# Patient Record
Sex: Female | Born: 1960 | Hispanic: No | Smoking: Never smoker
Health system: Southern US, Community
[De-identification: ages and names within clinical notes are randomized; demographics above are authoritative.]

## PROBLEM LIST (undated history)

## (undated) ENCOUNTER — Emergency Department (HOSPITAL_BASED_OUTPATIENT_CLINIC_OR_DEPARTMENT_OTHER): Payer: Medicaid Other

## (undated) DIAGNOSIS — Z9981 Dependence on supplemental oxygen: Secondary | ICD-10-CM

## (undated) DIAGNOSIS — I10 Essential (primary) hypertension: Secondary | ICD-10-CM

## (undated) DIAGNOSIS — D649 Anemia, unspecified: Secondary | ICD-10-CM

## (undated) DIAGNOSIS — R011 Cardiac murmur, unspecified: Secondary | ICD-10-CM

## (undated) DIAGNOSIS — E119 Type 2 diabetes mellitus without complications: Secondary | ICD-10-CM

## (undated) DIAGNOSIS — I350 Nonrheumatic aortic (valve) stenosis: Secondary | ICD-10-CM

## (undated) DIAGNOSIS — I2510 Atherosclerotic heart disease of native coronary artery without angina pectoris: Secondary | ICD-10-CM

## (undated) DIAGNOSIS — E785 Hyperlipidemia, unspecified: Secondary | ICD-10-CM

## (undated) HISTORY — PX: OTHER SURGICAL HISTORY: SHX169

## (undated) HISTORY — PX: MULTIPLE TOOTH EXTRACTIONS: SHX2053

## (undated) HISTORY — PX: HYSTEROSCOPY WITH D & C: SHX1775

---

## 2017-11-20 DIAGNOSIS — E119 Type 2 diabetes mellitus without complications: Secondary | ICD-10-CM

## 2017-11-20 DIAGNOSIS — I10 Essential (primary) hypertension: Secondary | ICD-10-CM | POA: Diagnosis present

## 2017-11-20 DIAGNOSIS — Z794 Long term (current) use of insulin: Secondary | ICD-10-CM

## 2019-08-09 DIAGNOSIS — K219 Gastro-esophageal reflux disease without esophagitis: Secondary | ICD-10-CM | POA: Diagnosis present

## 2019-08-09 DIAGNOSIS — N182 Chronic kidney disease, stage 2 (mild): Secondary | ICD-10-CM | POA: Diagnosis present

## 2021-07-17 ENCOUNTER — Inpatient Hospital Stay (HOSPITAL_BASED_OUTPATIENT_CLINIC_OR_DEPARTMENT_OTHER)
Admission: EM | Admit: 2021-07-17 | Discharge: 2021-07-22 | DRG: 516 | Disposition: A | Discharge status: Home / Self Care | Payer: Medicaid Other | Attending: Family Medicine | Admitting: Family Medicine

## 2021-07-17 ENCOUNTER — Encounter (HOSPITAL_BASED_OUTPATIENT_CLINIC_OR_DEPARTMENT_OTHER): Payer: Self-pay | Admitting: Emergency Medicine

## 2021-07-17 ENCOUNTER — Other Ambulatory Visit: Payer: Self-pay

## 2021-07-17 ENCOUNTER — Emergency Department (HOSPITAL_BASED_OUTPATIENT_CLINIC_OR_DEPARTMENT_OTHER): Payer: Medicaid Other

## 2021-07-17 DIAGNOSIS — G8929 Other chronic pain: Secondary | ICD-10-CM | POA: Diagnosis present

## 2021-07-17 DIAGNOSIS — E1142 Type 2 diabetes mellitus with diabetic polyneuropathy: Secondary | ICD-10-CM | POA: Diagnosis present

## 2021-07-17 DIAGNOSIS — H5462 Unqualified visual loss, left eye, normal vision right eye: Secondary | ICD-10-CM | POA: Diagnosis present

## 2021-07-17 DIAGNOSIS — E1136 Type 2 diabetes mellitus with diabetic cataract: Secondary | ICD-10-CM | POA: Diagnosis present

## 2021-07-17 DIAGNOSIS — Z6830 Body mass index (BMI) 30.0-30.9, adult: Secondary | ICD-10-CM

## 2021-07-17 DIAGNOSIS — K219 Gastro-esophageal reflux disease without esophagitis: Secondary | ICD-10-CM | POA: Diagnosis present

## 2021-07-17 DIAGNOSIS — E785 Hyperlipidemia, unspecified: Secondary | ICD-10-CM | POA: Diagnosis present

## 2021-07-17 DIAGNOSIS — M549 Dorsalgia, unspecified: Secondary | ICD-10-CM | POA: Diagnosis present

## 2021-07-17 DIAGNOSIS — Z880 Allergy status to penicillin: Secondary | ICD-10-CM

## 2021-07-17 DIAGNOSIS — Z8673 Personal history of transient ischemic attack (TIA), and cerebral infarction without residual deficits: Secondary | ICD-10-CM

## 2021-07-17 DIAGNOSIS — H35039 Hypertensive retinopathy, unspecified eye: Secondary | ICD-10-CM

## 2021-07-17 DIAGNOSIS — Z79899 Other long term (current) drug therapy: Secondary | ICD-10-CM

## 2021-07-17 DIAGNOSIS — I2510 Atherosclerotic heart disease of native coronary artery without angina pectoris: Secondary | ICD-10-CM | POA: Diagnosis present

## 2021-07-17 DIAGNOSIS — H539 Unspecified visual disturbance: Secondary | ICD-10-CM

## 2021-07-17 DIAGNOSIS — R011 Cardiac murmur, unspecified: Secondary | ICD-10-CM | POA: Diagnosis present

## 2021-07-17 DIAGNOSIS — N1832 Chronic kidney disease, stage 3b: Secondary | ICD-10-CM | POA: Diagnosis present

## 2021-07-17 DIAGNOSIS — I440 Atrioventricular block, first degree: Secondary | ICD-10-CM | POA: Diagnosis present

## 2021-07-17 DIAGNOSIS — H35031 Hypertensive retinopathy, right eye: Secondary | ICD-10-CM | POA: Diagnosis present

## 2021-07-17 DIAGNOSIS — E11319 Type 2 diabetes mellitus with unspecified diabetic retinopathy without macular edema: Secondary | ICD-10-CM

## 2021-07-17 DIAGNOSIS — N182 Chronic kidney disease, stage 2 (mild): Secondary | ICD-10-CM | POA: Diagnosis present

## 2021-07-17 DIAGNOSIS — M316 Other giant cell arteritis: Principal | ICD-10-CM | POA: Diagnosis present

## 2021-07-17 DIAGNOSIS — I10 Essential (primary) hypertension: Secondary | ICD-10-CM | POA: Diagnosis present

## 2021-07-17 DIAGNOSIS — E1165 Type 2 diabetes mellitus with hyperglycemia: Secondary | ICD-10-CM | POA: Diagnosis not present

## 2021-07-17 DIAGNOSIS — H547 Unspecified visual loss: Secondary | ICD-10-CM | POA: Diagnosis present

## 2021-07-17 DIAGNOSIS — R519 Headache, unspecified: Secondary | ICD-10-CM | POA: Diagnosis present

## 2021-07-17 DIAGNOSIS — H269 Unspecified cataract: Secondary | ICD-10-CM

## 2021-07-17 DIAGNOSIS — Z7984 Long term (current) use of oral hypoglycemic drugs: Secondary | ICD-10-CM

## 2021-07-17 DIAGNOSIS — Z7982 Long term (current) use of aspirin: Secondary | ICD-10-CM

## 2021-07-17 DIAGNOSIS — E119 Type 2 diabetes mellitus without complications: Secondary | ICD-10-CM

## 2021-07-17 DIAGNOSIS — E669 Obesity, unspecified: Secondary | ICD-10-CM | POA: Diagnosis present

## 2021-07-17 DIAGNOSIS — D631 Anemia in chronic kidney disease: Secondary | ICD-10-CM | POA: Diagnosis present

## 2021-07-17 DIAGNOSIS — Z713 Dietary counseling and surveillance: Secondary | ICD-10-CM

## 2021-07-17 DIAGNOSIS — Z794 Long term (current) use of insulin: Secondary | ICD-10-CM

## 2021-07-17 DIAGNOSIS — T380X5A Adverse effect of glucocorticoids and synthetic analogues, initial encounter: Secondary | ICD-10-CM | POA: Diagnosis not present

## 2021-07-17 DIAGNOSIS — I129 Hypertensive chronic kidney disease with stage 1 through stage 4 chronic kidney disease, or unspecified chronic kidney disease: Secondary | ICD-10-CM | POA: Diagnosis present

## 2021-07-17 DIAGNOSIS — N179 Acute kidney failure, unspecified: Secondary | ICD-10-CM | POA: Diagnosis not present

## 2021-07-17 DIAGNOSIS — E1122 Type 2 diabetes mellitus with diabetic chronic kidney disease: Secondary | ICD-10-CM | POA: Diagnosis present

## 2021-07-17 HISTORY — DX: Anemia, unspecified: D64.9

## 2021-07-17 HISTORY — DX: Essential (primary) hypertension: I10

## 2021-07-17 HISTORY — DX: Hyperlipidemia, unspecified: E78.5

## 2021-07-17 HISTORY — DX: Atherosclerotic heart disease of native coronary artery without angina pectoris: I25.10

## 2021-07-17 HISTORY — DX: Type 2 diabetes mellitus without complications: E11.9

## 2021-07-17 LAB — CBC WITH DIFFERENTIAL/PLATELET
Abs Immature Granulocytes: 0.06 10*3/uL (ref 0.00–0.07)
Basophils Absolute: 0 10*3/uL (ref 0.0–0.1)
Basophils Relative: 0 %
Eosinophils Absolute: 0.1 10*3/uL (ref 0.0–0.5)
Eosinophils Relative: 1 %
HCT: 31.6 % — ABNORMAL LOW (ref 36.0–46.0)
Hemoglobin: 10.4 g/dL — ABNORMAL LOW (ref 12.0–15.0)
Immature Granulocytes: 1 %
Lymphocytes Relative: 26 %
Lymphs Abs: 3.3 10*3/uL (ref 0.7–4.0)
MCH: 27.4 pg (ref 26.0–34.0)
MCHC: 32.9 g/dL (ref 30.0–36.0)
MCV: 83.2 fL (ref 80.0–100.0)
Monocytes Absolute: 0.7 10*3/uL (ref 0.1–1.0)
Monocytes Relative: 6 %
Neutro Abs: 8.2 10*3/uL — ABNORMAL HIGH (ref 1.7–7.7)
Neutrophils Relative %: 66 %
Platelets: 285 10*3/uL (ref 150–400)
RBC: 3.8 MIL/uL — ABNORMAL LOW (ref 3.87–5.11)
RDW: 13 % (ref 11.5–15.5)
WBC: 12.4 10*3/uL — ABNORMAL HIGH (ref 4.0–10.5)
nRBC: 0 % (ref 0.0–0.2)

## 2021-07-17 LAB — SEDIMENTATION RATE: Sed Rate: 78 mm/hr — ABNORMAL HIGH (ref 0–22)

## 2021-07-17 LAB — BASIC METABOLIC PANEL
Anion gap: 6 (ref 5–15)
BUN: 29 mg/dL — ABNORMAL HIGH (ref 8–23)
CO2: 24 mmol/L (ref 22–32)
Calcium: 9.4 mg/dL (ref 8.9–10.3)
Chloride: 104 mmol/L (ref 98–111)
Creatinine, Ser: 1.29 mg/dL — ABNORMAL HIGH (ref 0.44–1.00)
GFR, Estimated: 47 mL/min — ABNORMAL LOW (ref >60)
Glucose, Bld: 221 mg/dL — ABNORMAL HIGH (ref 70–99)
Potassium: 4.8 mmol/L (ref 3.5–5.1)
Sodium: 134 mmol/L — ABNORMAL LOW (ref 135–145)

## 2021-07-17 LAB — C-REACTIVE PROTEIN: CRP: 0.9 mg/dL (ref <1.0)

## 2021-07-17 IMAGING — CT CT ORBITS W/ CM
3 series · 14 of 47 positions shown, 16 images · IV contrast (Omnipaque)
Comparison: None Available.

CLINICAL DATA: Orbital cellulitis

EXAM:
CT ORBITS WITH CONTRAST
TECHNIQUE: Multidetector CT images was performed according to the standard
protocol following intravenous contrast administration.

[Series 3: orbits 2.0 h30s st · axial · 0.39mm/px · z∈[+1090,+1180]mm · 8 of 53 slices shown, 10 images]
[im 4/53  brain]
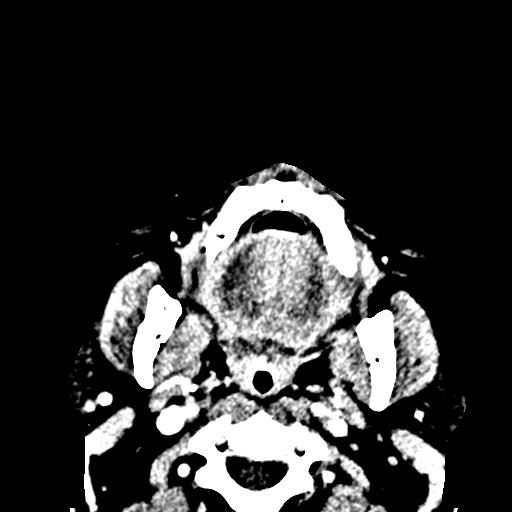
[im 4/53  bone]
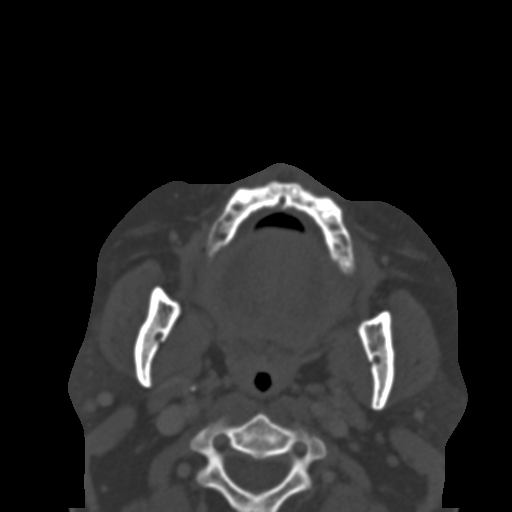
[im 11/53  bone]
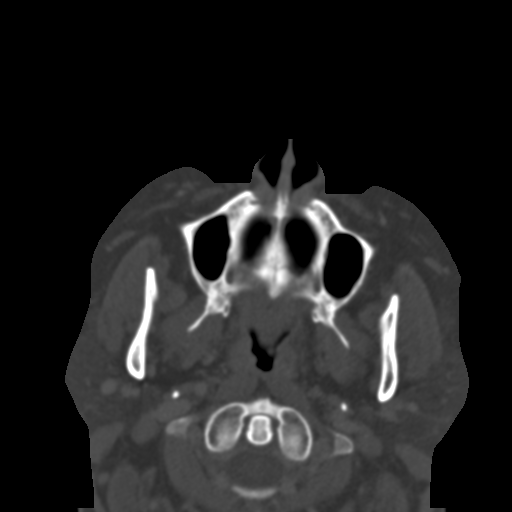
[im 17/53  bone]
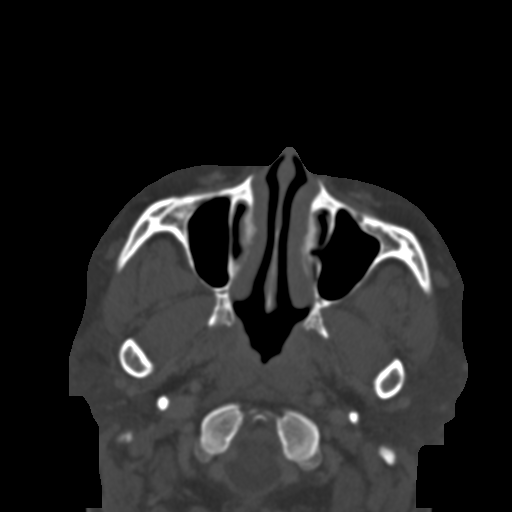
[im 24/53  bone]
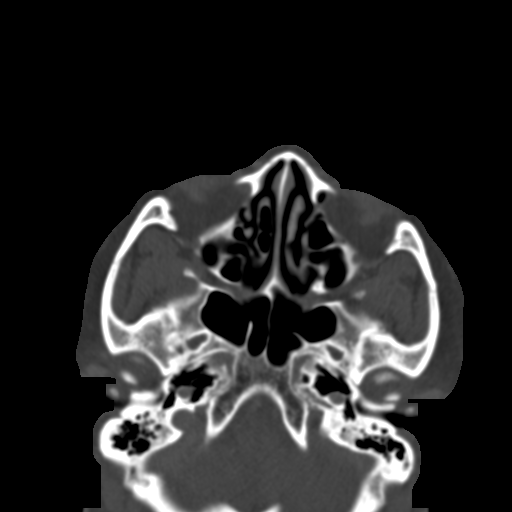
[im 29/53  brain]
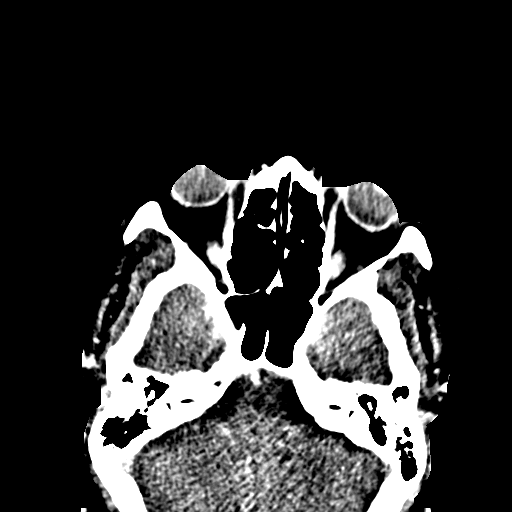
[im 29/53  bone]
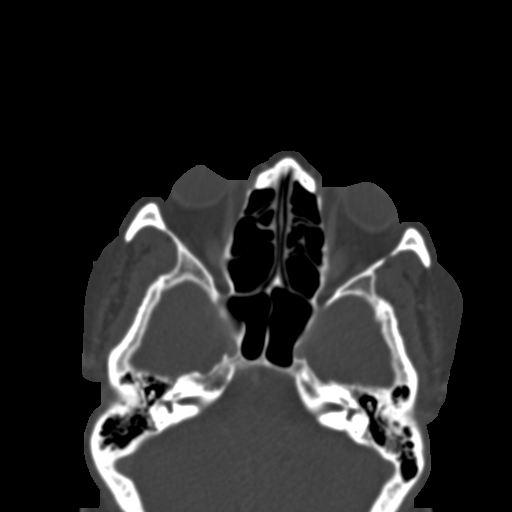
[im 36/53  bone]
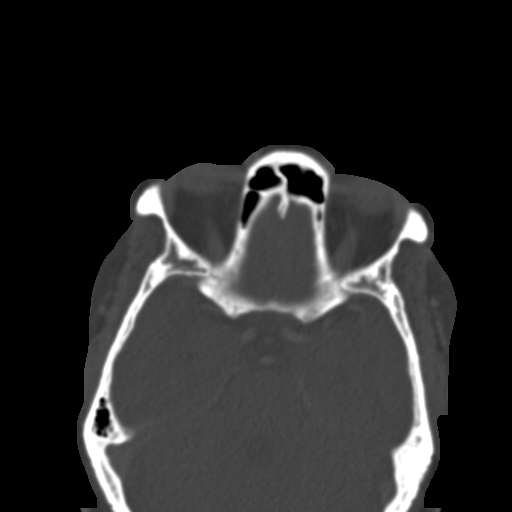
[im 42/53  bone]
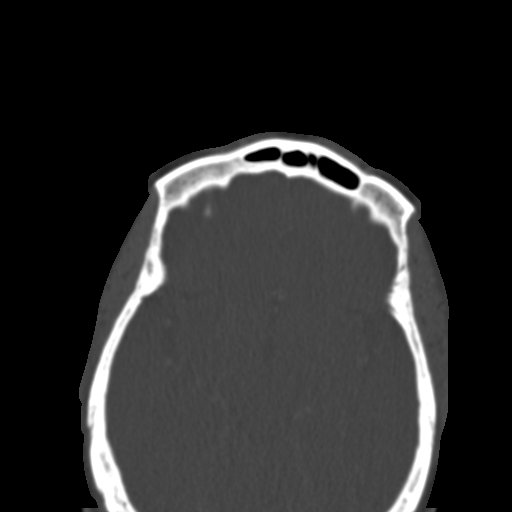
[im 49/53  bone]
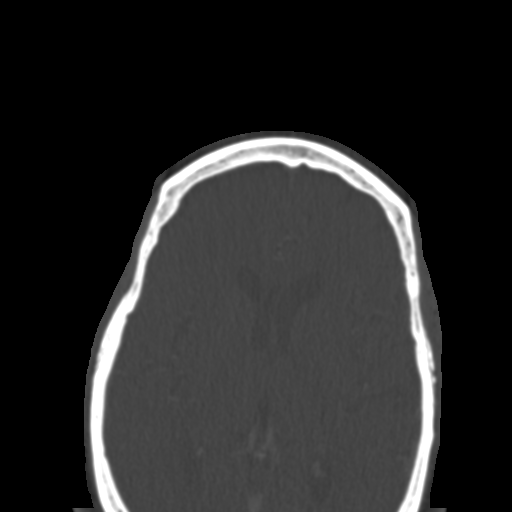

[Series 8: orbits 2.0 coronal st · coronal · 0.24mm/px · 3 of 71 slices shown]
[im 24/71  bone]
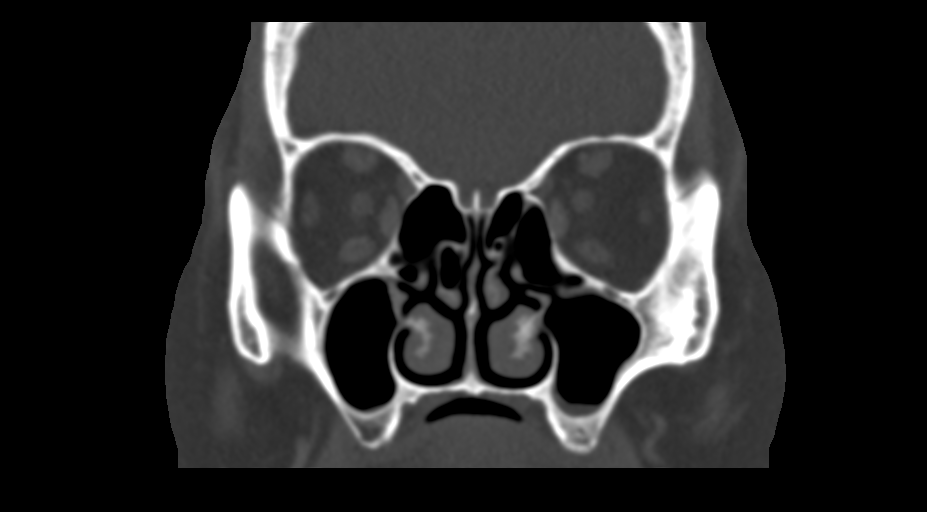
[im 32/71  bone]
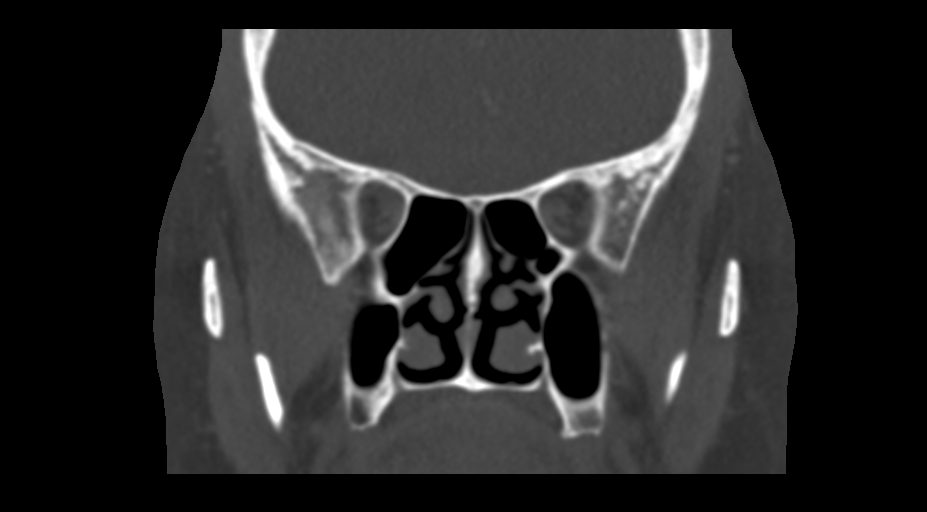
[im 39/71  bone]
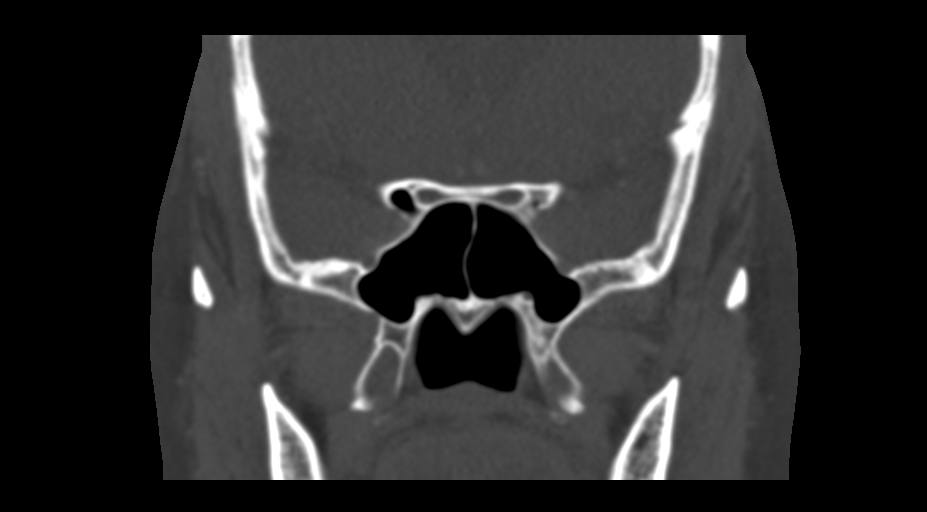

[Series 9: orbits 2.0 sagittal st · sagittal · 0.22mm/px · 3 of 88 slices shown]
[im 30/88  bone]
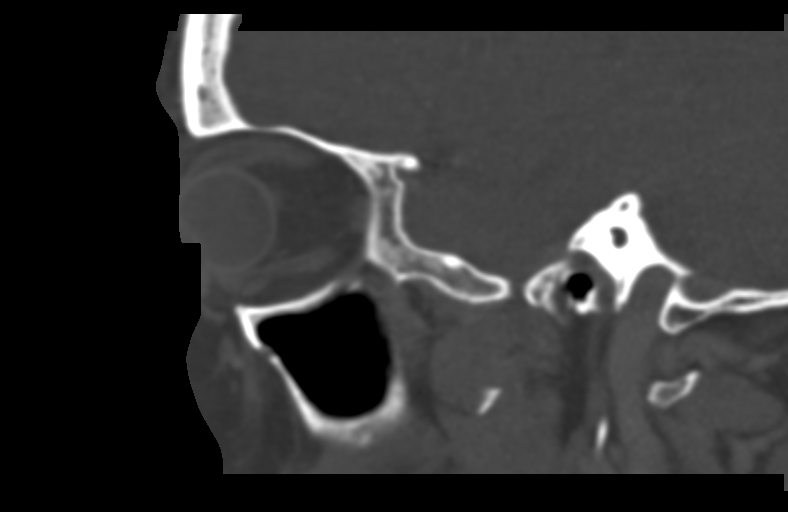
[im 44/88  bone]
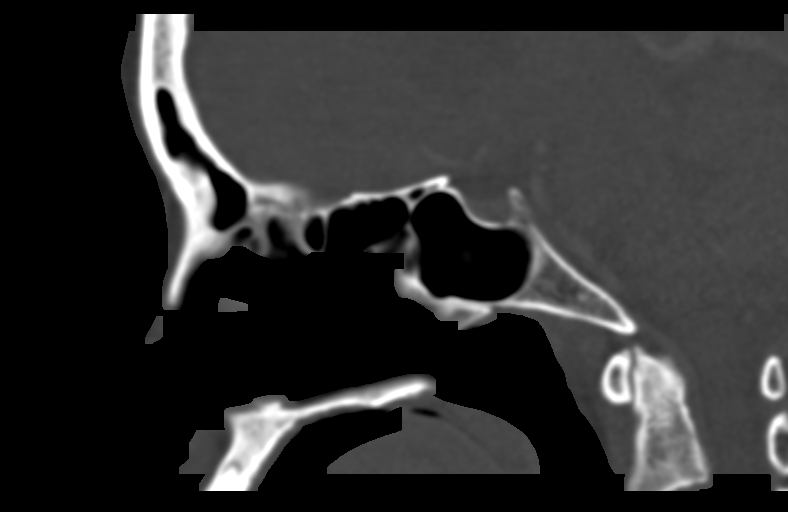
[im 59/88  bone]
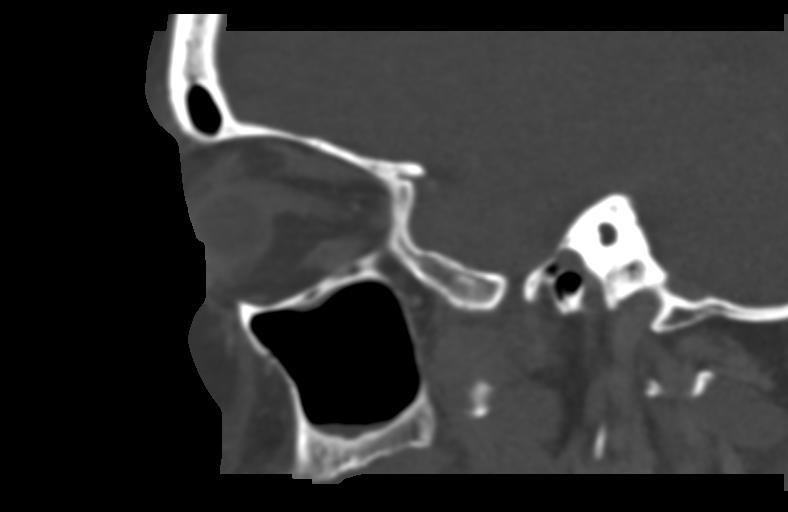

[14 of 47 positions shown; findings below may reference images not displayed]

RADIATION DOSE REDUCTION: This exam was performed according to the
departmental dose-optimization program which includes automated
exposure control, adjustment of the mA and/or kV according to
patient size and/or use of iterative reconstruction technique.

CONTRAST:  60mL OMNIPAQUE IOHEXOL 300 MG/ML  SOLN
FINDINGS: Orbits: No orbital mass or evidence of inflammation. Normal
appearance of the globes, optic nerve-sheath complexes, extraocular
muscles, orbital fat and lacrimal glands.

Visible paranasal sinuses: Clear.

Soft tissues: Normal.

Osseous: No fracture or aggressive lesion.

Limited intracranial: No acute or significant finding.
IMPRESSION: Unremarkable CT of the orbits. No visible edema or fluid collection.

## 2021-07-17 MED ORDER — ACETAMINOPHEN 325 MG PO TABS
650.0000 mg | ORAL_TABLET | Freq: Once | ORAL | Status: AC
  Administered 2021-07-17: 650 mg via ORAL
  Filled 2021-07-17: qty 2

## 2021-07-17 MED ORDER — METHYLPREDNISOLONE SODIUM SUCC 125 MG IJ SOLR
INTRAMUSCULAR | Status: AC
  Filled 2021-07-17: qty 2

## 2021-07-17 MED ORDER — FENTANYL CITRATE PF 50 MCG/ML IJ SOSY
25.0000 ug | PREFILLED_SYRINGE | Freq: Once | INTRAMUSCULAR | Status: AC
  Administered 2021-07-17: 25 ug via INTRAVENOUS
  Filled 2021-07-17: qty 1

## 2021-07-17 MED ORDER — METHYLPREDNISOLONE SODIUM SUCC 125 MG IJ SOLR
INTRAMUSCULAR | Status: AC
Start: 2021-07-17 — End: 2021-07-18
  Filled 2021-07-17: qty 16

## 2021-07-17 MED ORDER — IOHEXOL 300 MG/ML  SOLN
60.0000 mL | Freq: Once | INTRAMUSCULAR | Status: AC | PRN
  Administered 2021-07-17: 60 mL via INTRAVENOUS

## 2021-07-17 MED ORDER — FENTANYL CITRATE PF 50 MCG/ML IJ SOSY
50.0000 ug | PREFILLED_SYRINGE | Freq: Once | INTRAMUSCULAR | Status: AC
Start: 2021-07-17 — End: 2021-07-17
  Administered 2021-07-17: 50 ug via INTRAVENOUS
  Filled 2021-07-17: qty 1

## 2021-07-17 MED ORDER — SODIUM CHLORIDE 0.9 % IV SOLN
INTRAVENOUS | Status: DC | PRN
  Administered 2021-07-20: 10 mL/h via INTRAVENOUS

## 2021-07-17 MED ORDER — SODIUM CHLORIDE 0.9 % IV SOLN
1000.0000 mg | Freq: Every day | INTRAVENOUS | Status: AC
  Administered 2021-07-17: 1000 mg via INTRAVENOUS
  Filled 2021-07-17: qty 16

## 2021-07-17 NOTE — ED Notes (Signed)
Patient states she does not have glasses. RN made aware.

## 2021-07-17 NOTE — ED Triage Notes (Addendum)
Pt arrives pov with family, c/o right eye pain and right ear pain with tinnitus x 3 days, denies injury or foreign object. Swelling and tenderness noted. 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 Pt speaks arabic.

## 2021-07-17 NOTE — Consult Note (Signed)
Chief Complaint/Reason for Consultation: R periocular pain and vision change, concern for temporal arteritis  HPI: 61 yo F PMH DM, HTN, HLD presents with 3 day history of HA, R temporal pain and 1 day history of vision changes. She notes change in her peripheral vision and family states that normally she is able to sew, though since this change in her vision she can no longer see well enough to do so. She notes OS has not seen well for years. Her last eye exam was in [place], before coming to the [place] 3 years ago. She notes the pain around the OD also radiates towards the back of her scalp. Denies diplopia, though patient's family states that her eyes appear to drift out more the last day or so than before. Pt told ED MD she had jaw claudication, though patient noted she did not have this during our interview.  Patient speaks Arabic and history obtained with help of family.  ROS: Denies fevers or chills, otherwise as in HPI    Allergies  Allergen Reactions   Penicillins Anaphylaxis    PMH DM2, HTN, HLD PSH Cataract surgery OD   EXAMINATION  VAcc (near): OD: 20/70+1  , PHNI OS: Light perception  Pupils:  OD: Equal, round, reactive, no APD OS: Equal, round, reactive, no APD  T(Pen): OD: 24   mm Hg OS: 19  mm Hg  CVF: OD superior constriction OS unable to test (LP vision) EOM: full OU, Left exotropia  Color (ishihara) OD 14/14 OS unable to test  Slit lamp Exam: Ext/Lids: +R temporal tenderness, symmetrical ptosis OU, levator function >104m OU Conj/Sclera: white and quiet OU, pinguecula nasal and temporal OU Cornea: OD: 170manterior stromal scar, no abrasion or infiltrate OS clear, no abrasion or infiltrate  AC: OD Deep and Quiet OS moderately shallow Iris: Round and Flat OU Lens: OD PCIOL with mild PCO, OS mature cataract  Dilated OU with phenylephrine and tropicamide OU @ 21:54 hours  Dilated Fundus Exam (OD only, OS no view): Vitreous: Dense asteroid hyalosis  Disc: no  waxy pallor, sharp margins and pink with 0.3 c/d, +spontaneous venous pulsations Macula: flat and dry  Vessels: moderate arteriorlar narrowing and AV nicking, no emboli noted Periphery: flat and attached 360 without breaks or tears OU, few microaneurysms    Imp/Plan:  Headache and R temporal tenderness Elevated ESR of 78, normal PLT, pending CRP Normal color vision and no afferent pupillary defect OD Subnormal vision OD without any other clear ocular cause Given exam findings and reported subjective visual changes, I have a moderately high suspicion of giant cell arteritis. We discussed that with these findings combined with poor baseline vision OS I would recommend full treatment and workup for giant cell arteritis with ocular involvement, including IV corticosteroids and consult for temporal artery biopsy. Recommend IV steroid course such as Methylprednisolone 25088mV q6h x 12 doses with following oral treatment.  We did discuss this would likely negatively affect her glucose control, but the consequences of under-treating GCA can result in blindness  Mature cataract OS Family unsure of visual potential of the OS, pt has had trouble finding eye doctors that take her insurance (Medicaid)--[facility] her Mild nonproliferative diabetic retinopathy OD Hypertensive retinopathy OD    SteLonia Skinner.D. [facility]hthal[facility]

## 2021-07-17 NOTE — ED Notes (Signed)
Patient transported to CT 

## 2021-07-17 NOTE — ED Provider Notes (Signed)
[facility] EMERGENCY DEPARTMENT Provider Note   CSN: [id number] Arrival date & time: 07/17/21  1516     History  Chief Complaint  Patient presents with   Eye Problem    Chelsea English is a 61 y.o. female.   Eye Problem Associated symptoms: headaches   Associated symptoms: no discharge and no redness    61 year old female with a history of cataracts (legally blind in the left eye) who presents to the emergency department with eye pain, headache, jaw claudication, pain with extraocular movements and vision changes/vision loss.  The patient denies any traumatic injury or foreign object in her eye.  She has had some tenderness and headache along her right jaw and right forehead.  She endorses vision loss in the right lateral visual field.  She denies any diplopia but has limited vision in the left eye.  She denies any fevers or chills.   Home Medications Prior to Admission medications   Not on File      Allergies    Penicillins    Review of Systems   Review of Systems  Eyes:  Positive for visual disturbance. Negative for discharge and redness.  Neurological:  Positive for headaches.  All other systems reviewed and are negative.  Physical Exam Updated Vital Signs BP (!) 159/82   Pulse 87   Temp 98.6 F (37 C) (Oral)   Resp 18   Ht 5' 3"  (1.6 m)   Wt 77.6 kg   SpO2 100%   BMI 30.29 kg/m  Physical Exam Vitals and nursing note reviewed.  Constitutional:      General: She is not in acute distress.    Appearance: She is well-developed.  HENT:     Head: Normocephalic and atraumatic.     Comments: Right sided temporal arterial tenderness to palpation Eyes:     Conjunctiva/sclera: Conjunctivae normal.     Comments: L eye blind, R eye visual fields intact with some decrease in peripheral vision on the right. EOMs intact.   Cardiovascular:     Rate and Rhythm: Normal rate and regular rhythm.  Pulmonary:     Effort: Pulmonary effort is normal. No respiratory  distress.     Breath sounds: Normal breath sounds.  Abdominal:     Palpations: Abdomen is soft.     Tenderness: There is no abdominal tenderness.  Musculoskeletal:        General: No swelling.     Cervical back: Neck supple.  Skin:    General: Skin is warm and dry.     Capillary Refill: Capillary refill takes less than 2 seconds.  Neurological:     General: No focal deficit present.     Mental Status: She is alert. Mental status is at baseline.     Cranial Nerves: No cranial nerve deficit.     Sensory: No sensory deficit.     Motor: No weakness.     Gait: Gait normal.  Psychiatric:        Mood and Affect: Mood normal.    ED Results / Procedures / Treatments   Labs (all labs ordered are listed, but only abnormal results are displayed) Labs Reviewed  CBC WITH DIFFERENTIAL/PLATELET - Abnormal; Notable for the following components:      Result Value   WBC 12.4 (*)    RBC 3.80 (*)    Hemoglobin 10.4 (*)    HCT 31.6 (*)    Neutro Abs 8.2 (*)    All other components within normal limits  BASIC METABOLIC PANEL - Abnormal; Notable for the following components:   Sodium 134 (*)    Glucose, Bld 221 (*)    BUN 29 (*)    Creatinine, Ser 1.29 (*)    GFR, Estimated 47 (*)    All other components within normal limits  SEDIMENTATION RATE - Abnormal; Notable for the following components:   Sed Rate 78 (*)    All other components within normal limits  C-REACTIVE PROTEIN    EKG EKG Interpretation  Date/Time:  Saturday Jul 17 2021 22:35:36 EDT Ventricular Rate:  69 PR Interval:  203 QRS Duration: 84 QT Interval:  373 QTC Calculation: 400 R Axis:   -16 Text Interpretation: Sinus rhythm Low voltage, precordial leads Left ventricular hypertrophy No old tracing to compare Confirmed by Delora Fuel ([id number]) on 07/17/2021 11:11:17 PM  Radiology CT Orbits W Contrast  Result Date: 07/17/2021 CLINICAL DATA:  Orbital cellulitis EXAM: CT ORBITS WITH CONTRAST TECHNIQUE: Multidetector CT  images was performed according to the standard protocol following intravenous contrast administration. RADIATION DOSE REDUCTION: This exam was performed according to the departmental dose-optimization program which includes automated exposure control, adjustment of the mA and/or kV according to patient size and/or use of iterative reconstruction technique. CONTRAST:  60m OMNIPAQUE IOHEXOL 300 MG/ML  SOLN COMPARISON:  None Available. FINDINGS: Orbits: No orbital mass or evidence of inflammation. Normal appearance of the globes, optic nerve-sheath complexes, extraocular muscles, orbital fat and lacrimal glands. Visible paranasal sinuses: Clear. Soft tissues: Normal. Osseous: No fracture or aggressive lesion. Limited intracranial: No acute or significant finding. IMPRESSION: Unremarkable CT of the orbits. No visible edema or fluid collection. Electronically Signed   By: FMargaretha SheffieldM.D.   On: 07/17/2021 18:50    Procedures Procedures    Medications Ordered in ED Medications  methylPREDNISolone sodium succinate (SOLU-MEDROL) 125 mg/2 mL injection (has no administration in time range)  0.9 %  sodium chloride infusion ( Intravenous New Bag/Given 07/17/21 2142)  fentaNYL (SUBLIMAZE) injection 50 mcg (50 mcg Intravenous Given 07/17/21 1728)  iohexol (OMNIPAQUE) 300 MG/ML solution 60 mL (60 mLs Intravenous Contrast Given 07/17/21 1824)  acetaminophen (TYLENOL) tablet 650 mg (650 mg Oral Given 07/17/21 2012)  fentaNYL (SUBLIMAZE) injection 25 mcg (25 mcg Intravenous Given 07/17/21 2013)  methylPREDNISolone sodium succinate (SOLU-MEDROL) 1,000 mg in sodium chloride 0.9 % 50 mL IVPB (1,000 mg Intravenous New Bag/Given 07/17/21 2144)    ED Course/ Medical Decision Making/ A&P Clinical Course as of 07/17/21 2339  Sat Jul 17, 2021  1759 WBC(!): 12.4 [JL]  2037 Sed Rate(!): 78 [JL]    Clinical Course User Index [JL] Chelsea English                           Medical Decision Making Amount and/or  Complexity of Data Reviewed Labs: ordered. Decision-making details documented in ED Course. Radiology: ordered.  Risk OTC drugs. Prescription drug management. Decision regarding hospitalization.    61year old female with a history of cataracts (legally blind in the left eye) who presents to the emergency department with eye pain, headache, jaw claudication, pain with extraocular movements and vision changes/vision loss.  The patient denies any traumatic injury or foreign object in her eye.  She has had some tenderness and headache along her right jaw and right forehead.  She endorses vision loss in the right lateral visual field.  She denies any diplopia but has limited vision in the left eye.  She denies any fevers  or chills.   On arrival, the patient was afebrile, temperature 98.6, not tachycardic or tachypneic, hypertensive BP 168/73, saturating 96% on room air.  Sinus rhythm noted on cardiac telemetry.  Differential diagnosis includes orbital cellulitis, periorbital cellulitis, giant cell arteritis, tension type headache, migraine headache, CVA.  No evidence for acute angle-closure glaucoma on exam with pupillary reflexes intact on the right.  Lower concern for carotid artery dissection, cavernous sinus thrombosis or third cerebral sinus thrombosis.  No rash to suggest herpes zoster ophthalmicus.  IV access was obtained and the patient was administered IV fentanyl for pain control, oral Tylenol.  Laboratory work-up significant for leukocytosis 12.4 and an elevated ESR to 78.  A CT orbits was performed which resulted negative for orbital cellulitis or periorbital cellulitis.  Given the patient's elevated inflammatory markers, temporal arterial tenderness to palpation, report of jaw claudication over the past few days, vision loss of peripheral vision on the right, concern primarily for giant cell arteritis.  Spoke with Dr. Eulas Post of ophthalmology who agrees with concern for temporal  arteritis syndrome.  Recommended admission.  He will evaluate the patient at [facility] prior to transfer for admission.  The patient was administered 1000 mg of IV Solu-Medrol to treat for GCA.  Room air the patient's laboratory work-up was significant for an anemia to 10.4, hyperglycemia to 221, elevated BUN to 29, elevated creatinine to 1.29, GFR 47.   Dr. Hal Hope of hospitalist medicine was consulted who accepted the patient in admission for further evaluation and treatment.  We will plan for neurology consultation while inpatient for continued management.  Ophthalmology following as well.   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

## 2021-07-17 NOTE — ED Notes (Signed)
Pt transported to CT ?

## 2021-07-18 ENCOUNTER — Inpatient Hospital Stay (HOSPITAL_COMMUNITY): Payer: Medicaid Other

## 2021-07-18 DIAGNOSIS — H539 Unspecified visual disturbance: Secondary | ICD-10-CM | POA: Diagnosis not present

## 2021-07-18 DIAGNOSIS — M549 Dorsalgia, unspecified: Secondary | ICD-10-CM | POA: Diagnosis present

## 2021-07-18 DIAGNOSIS — Z6830 Body mass index (BMI) 30.0-30.9, adult: Secondary | ICD-10-CM | POA: Diagnosis not present

## 2021-07-18 DIAGNOSIS — H35031 Hypertensive retinopathy, right eye: Secondary | ICD-10-CM | POA: Diagnosis present

## 2021-07-18 DIAGNOSIS — R011 Cardiac murmur, unspecified: Secondary | ICD-10-CM | POA: Diagnosis present

## 2021-07-18 DIAGNOSIS — G8929 Other chronic pain: Secondary | ICD-10-CM | POA: Diagnosis present

## 2021-07-18 DIAGNOSIS — I2510 Atherosclerotic heart disease of native coronary artery without angina pectoris: Secondary | ICD-10-CM | POA: Diagnosis present

## 2021-07-18 DIAGNOSIS — I440 Atrioventricular block, first degree: Secondary | ICD-10-CM | POA: Diagnosis present

## 2021-07-18 DIAGNOSIS — E1122 Type 2 diabetes mellitus with diabetic chronic kidney disease: Secondary | ICD-10-CM | POA: Diagnosis present

## 2021-07-18 DIAGNOSIS — Z7982 Long term (current) use of aspirin: Secondary | ICD-10-CM | POA: Diagnosis not present

## 2021-07-18 DIAGNOSIS — Z794 Long term (current) use of insulin: Secondary | ICD-10-CM | POA: Diagnosis not present

## 2021-07-18 DIAGNOSIS — Z7984 Long term (current) use of oral hypoglycemic drugs: Secondary | ICD-10-CM | POA: Diagnosis not present

## 2021-07-18 DIAGNOSIS — D631 Anemia in chronic kidney disease: Secondary | ICD-10-CM | POA: Diagnosis present

## 2021-07-18 DIAGNOSIS — I10 Essential (primary) hypertension: Secondary | ICD-10-CM | POA: Diagnosis not present

## 2021-07-18 DIAGNOSIS — N1832 Chronic kidney disease, stage 3b: Secondary | ICD-10-CM | POA: Diagnosis present

## 2021-07-18 DIAGNOSIS — E1142 Type 2 diabetes mellitus with diabetic polyneuropathy: Secondary | ICD-10-CM

## 2021-07-18 DIAGNOSIS — N182 Chronic kidney disease, stage 2 (mild): Secondary | ICD-10-CM | POA: Diagnosis not present

## 2021-07-18 DIAGNOSIS — K219 Gastro-esophageal reflux disease without esophagitis: Secondary | ICD-10-CM

## 2021-07-18 DIAGNOSIS — E669 Obesity, unspecified: Secondary | ICD-10-CM | POA: Diagnosis present

## 2021-07-18 DIAGNOSIS — E1165 Type 2 diabetes mellitus with hyperglycemia: Secondary | ICD-10-CM | POA: Diagnosis not present

## 2021-07-18 DIAGNOSIS — M898X8 Other specified disorders of bone, other site: Secondary | ICD-10-CM | POA: Diagnosis not present

## 2021-07-18 DIAGNOSIS — H5462 Unqualified visual loss, left eye, normal vision right eye: Secondary | ICD-10-CM | POA: Diagnosis present

## 2021-07-18 DIAGNOSIS — E1136 Type 2 diabetes mellitus with diabetic cataract: Secondary | ICD-10-CM | POA: Diagnosis present

## 2021-07-18 DIAGNOSIS — M316 Other giant cell arteritis: Secondary | ICD-10-CM | POA: Diagnosis not present

## 2021-07-18 DIAGNOSIS — E785 Hyperlipidemia, unspecified: Secondary | ICD-10-CM | POA: Diagnosis present

## 2021-07-18 DIAGNOSIS — H538 Other visual disturbances: Secondary | ICD-10-CM | POA: Diagnosis not present

## 2021-07-18 DIAGNOSIS — I129 Hypertensive chronic kidney disease with stage 1 through stage 4 chronic kidney disease, or unspecified chronic kidney disease: Secondary | ICD-10-CM | POA: Diagnosis present

## 2021-07-18 DIAGNOSIS — R519 Headache, unspecified: Secondary | ICD-10-CM | POA: Diagnosis present

## 2021-07-18 DIAGNOSIS — N179 Acute kidney failure, unspecified: Secondary | ICD-10-CM | POA: Diagnosis not present

## 2021-07-18 DIAGNOSIS — Z880 Allergy status to penicillin: Secondary | ICD-10-CM | POA: Diagnosis not present

## 2021-07-18 DIAGNOSIS — H547 Unspecified visual loss: Secondary | ICD-10-CM | POA: Diagnosis present

## 2021-07-18 LAB — COMPREHENSIVE METABOLIC PANEL
ALT: 20 U/L (ref 0–44)
AST: 21 U/L (ref 15–41)
Albumin: 3.6 g/dL (ref 3.5–5.0)
Alkaline Phosphatase: 116 U/L (ref 38–126)
Anion gap: 10 (ref 5–15)
BUN: 27 mg/dL — ABNORMAL HIGH (ref 8–23)
CO2: 21 mmol/L — ABNORMAL LOW (ref 22–32)
Calcium: 9.6 mg/dL (ref 8.9–10.3)
Chloride: 105 mmol/L (ref 98–111)
Creatinine, Ser: 1.38 mg/dL — ABNORMAL HIGH (ref 0.44–1.00)
GFR, Estimated: 44 mL/min — ABNORMAL LOW (ref >60)
Glucose, Bld: 216 mg/dL — ABNORMAL HIGH (ref 70–99)
Potassium: 4.9 mmol/L (ref 3.5–5.1)
Sodium: 136 mmol/L (ref 135–145)
Total Bilirubin: 0.4 mg/dL (ref 0.3–1.2)
Total Protein: 7.9 g/dL (ref 6.5–8.1)

## 2021-07-18 LAB — GLUCOSE, CAPILLARY
Glucose-Capillary: 209 mg/dL — ABNORMAL HIGH (ref 70–99)
Glucose-Capillary: 278 mg/dL — ABNORMAL HIGH (ref 70–99)
Glucose-Capillary: 284 mg/dL — ABNORMAL HIGH (ref 70–99)
Glucose-Capillary: 305 mg/dL — ABNORMAL HIGH (ref 70–99)
Glucose-Capillary: 291 mg/dL — ABNORMAL HIGH (ref 70–99)

## 2021-07-18 LAB — CBC WITH DIFFERENTIAL/PLATELET
Abs Immature Granulocytes: 0.19 10*3/uL — ABNORMAL HIGH (ref 0.00–0.07)
Basophils Absolute: 0 10*3/uL (ref 0.0–0.1)
Basophils Relative: 0 %
Eosinophils Absolute: 0 10*3/uL (ref 0.0–0.5)
Eosinophils Relative: 0 %
HCT: 30 % — ABNORMAL LOW (ref 36.0–46.0)
Hemoglobin: 9.6 g/dL — ABNORMAL LOW (ref 12.0–15.0)
Immature Granulocytes: 1 %
Lymphocytes Relative: 8 %
Lymphs Abs: 1 10*3/uL (ref 0.7–4.0)
MCH: 26.7 pg (ref 26.0–34.0)
MCHC: 32 g/dL (ref 30.0–36.0)
MCV: 83.3 fL (ref 80.0–100.0)
Monocytes Absolute: 0.1 10*3/uL (ref 0.1–1.0)
Monocytes Relative: 1 %
Neutro Abs: 11.8 10*3/uL — ABNORMAL HIGH (ref 1.7–7.7)
Neutrophils Relative %: 90 %
Platelets: 270 10*3/uL (ref 150–400)
RBC: 3.6 MIL/uL — ABNORMAL LOW (ref 3.87–5.11)
RDW: 12.9 % (ref 11.5–15.5)
WBC: 13.2 10*3/uL — ABNORMAL HIGH (ref 4.0–10.5)
nRBC: 0 % (ref 0.0–0.2)

## 2021-07-18 LAB — LIPID PANEL
Cholesterol: 152 mg/dL (ref 0–200)
HDL: 27 mg/dL — ABNORMAL LOW (ref >40)
LDL Cholesterol: 94 mg/dL (ref 0–99)
Total CHOL/HDL Ratio: 5.6 RATIO
Triglycerides: 156 mg/dL — ABNORMAL HIGH (ref <150)
VLDL: 31 mg/dL (ref 0–40)

## 2021-07-18 LAB — MAGNESIUM: Magnesium: 1.9 mg/dL (ref 1.7–2.4)

## 2021-07-18 LAB — HIV ANTIBODY (ROUTINE TESTING W REFLEX): HIV Screen 4th Generation wRfx: NONREACTIVE

## 2021-07-18 LAB — HEMOGLOBIN A1C
Hgb A1c MFr Bld: 8.8 % — ABNORMAL HIGH (ref 4.8–5.6)
Mean Plasma Glucose: 205.86 mg/dL

## 2021-07-18 LAB — TSH: TSH: 1.502 u[IU]/mL (ref 0.350–4.500)

## 2021-07-18 IMAGING — MR MR HEAD WO/W CM
6 of 12 series · 26 of 48 positions shown · IV contrast (8ML GADAVIST)
Comparison: CT of the orbits from yesterday

CLINICAL DATA: Monocular vision loss

EXAM:
MRI HEAD AND ORBITS WITHOUT AND WITH CONTRAST
TECHNIQUE: Multiplanar, multiecho pulse sequences of the brain and surrounding
structures were obtained without and with intravenous contrast.
Multiplanar, multiecho pulse sequences of the orbits and surrounding
structures were obtained including fat saturation techniques, before
and after intravenous contrast administration.
CONTRAST:  8mL GADAVIST GADOBUTROL 1 MMOL/ML IV SOLN

[Series 3: DWI · axial · 3.0mm · 0.94mm/px · z∈[-99,+44]mm · 8 of 98 slices shown (1 of 2)]
[im 1/98]
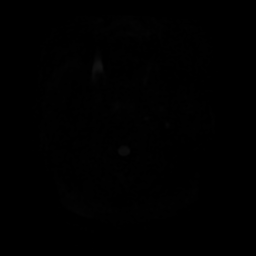
[im 14/98]
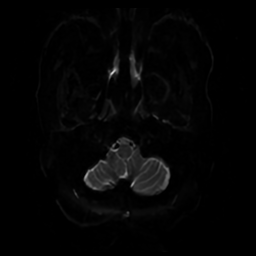
[im 28/98]
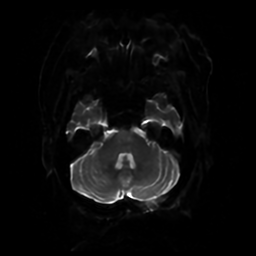
[im 42/98]
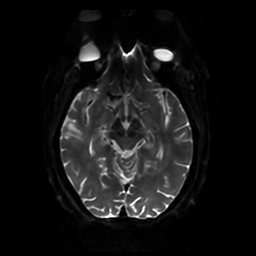
[im 56/98]
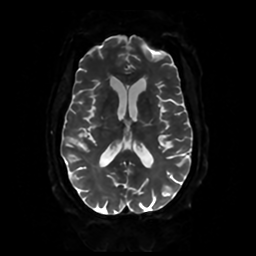
[im 70/98]
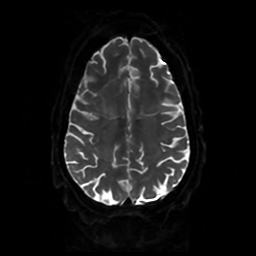
[im 84/98]
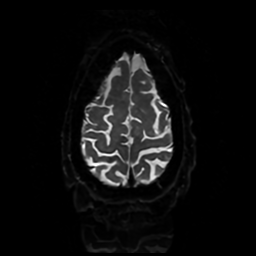
[im 98/98]
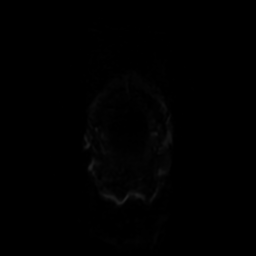

[Series 4: DWI · coronal · 4.0mm · 0.94mm/px · 6 of 72 slices shown (2 of 2)]
[im 1/72]
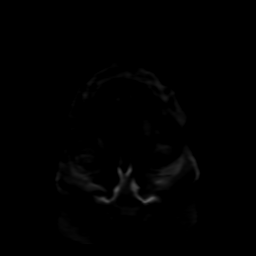
[im 15/72]
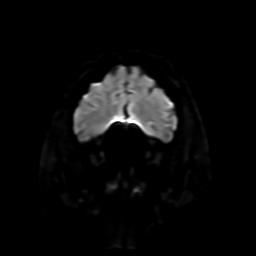
[im 29/72]
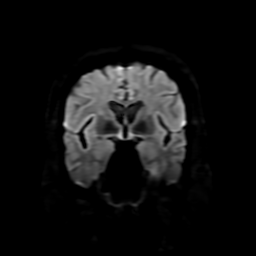
[im 43/72]
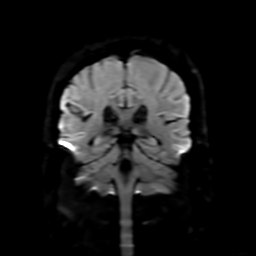
[im 57/72]
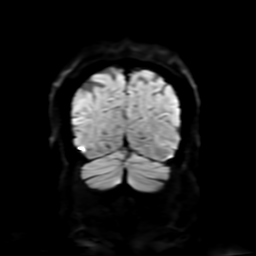
[im 72/72]
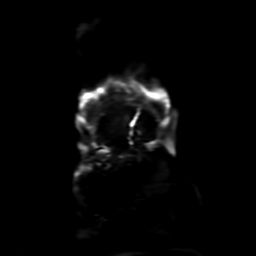

[Series 5: FLAIR · sagittal · 5.0mm · 0.23mm/px · 2 of 24 slices shown (1 of 2)]
[im 1/24]
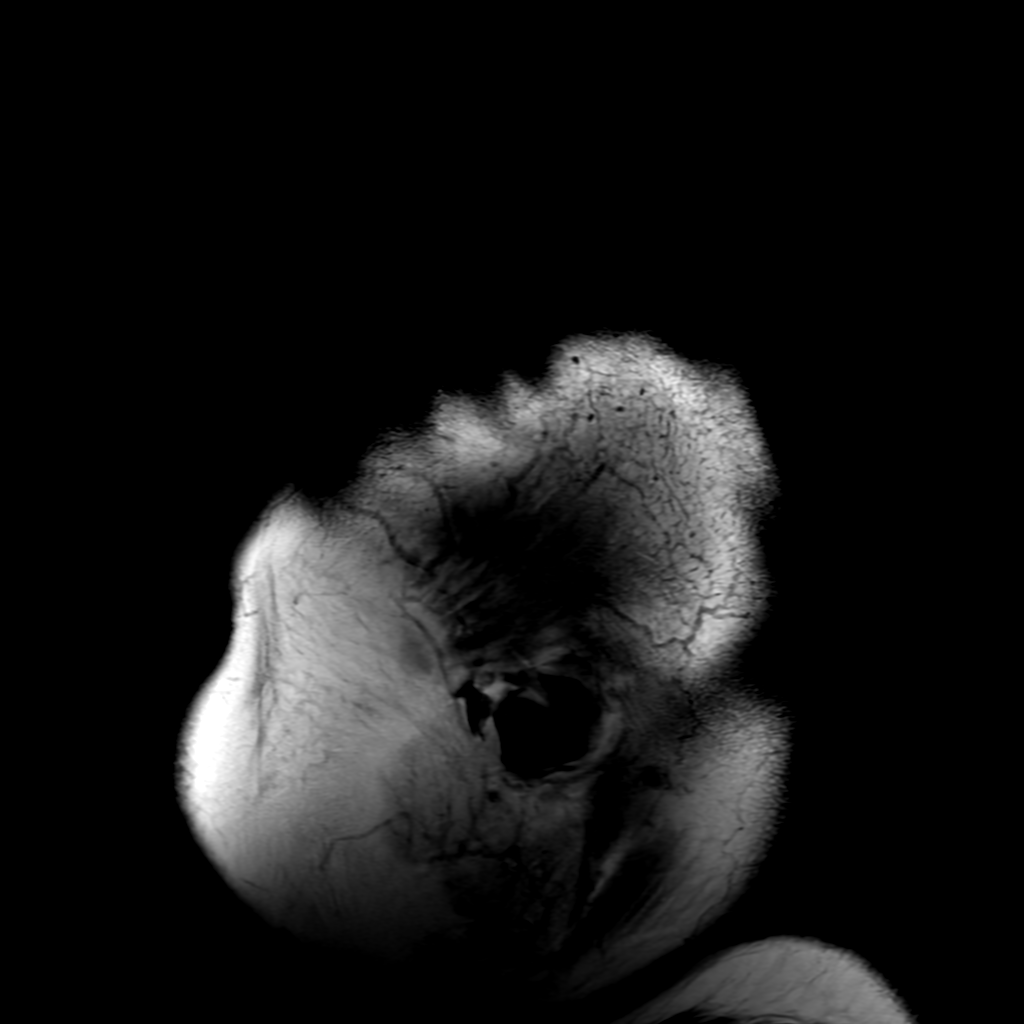
[im 24/24]
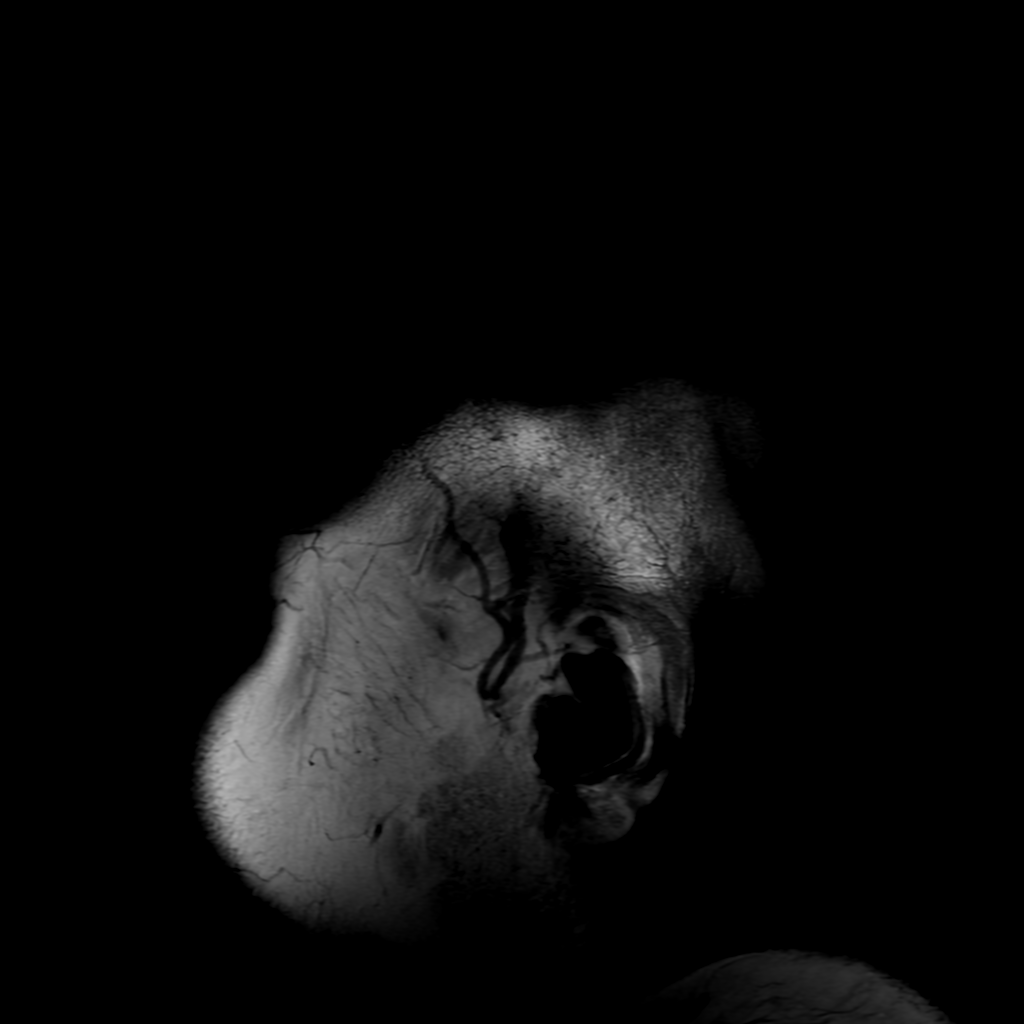

[Series 7: FLAIR · axial · 4.0mm · 0.45mm/px · z∈[-80,+65]mm · 3 of 34 slices shown (2 of 2)]
[im 1/34]
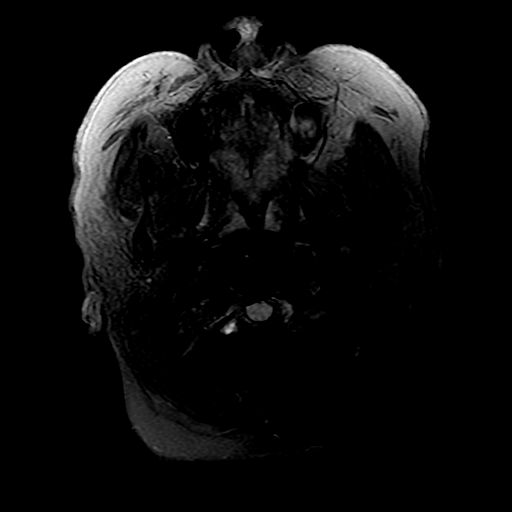
[im 17/34]
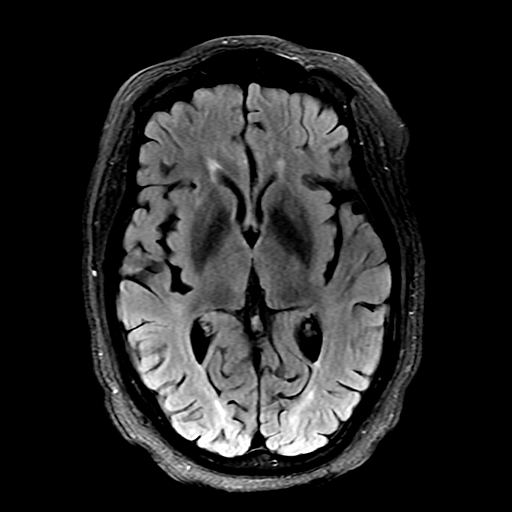
[im 34/34]
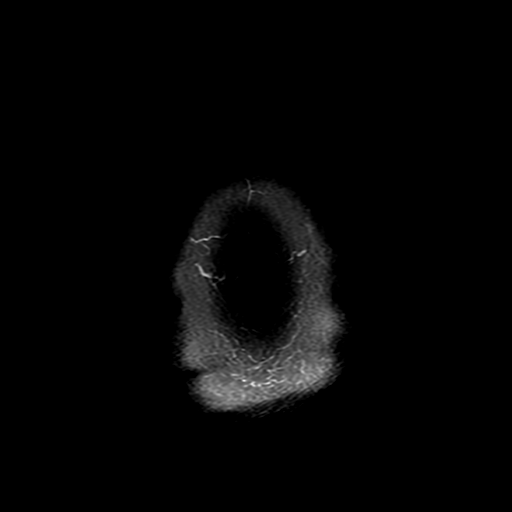

[Series 350: ADC · axial · 3.0mm · 0.94mm/px · z∈[-99,+44]mm · 4 of 44 slices shown (1 of 2)]
[im 1/44]
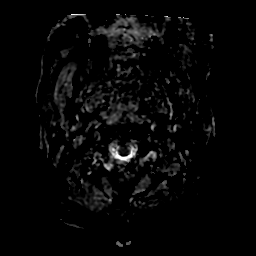
[im 15/44]
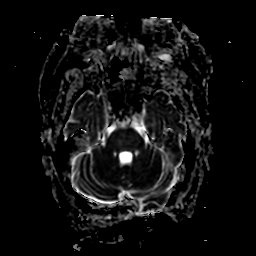
[im 29/44]
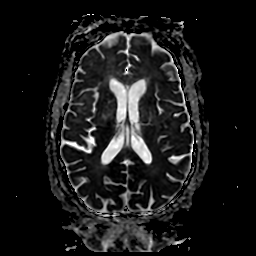
[im 44/44]
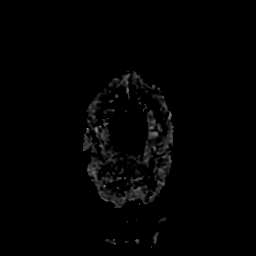

[Series 450: ADC · coronal · 4.0mm · 0.94mm/px · 3 of 36 slices shown (2 of 2)]
[im 1/36]
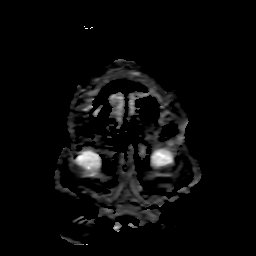
[im 18/36]
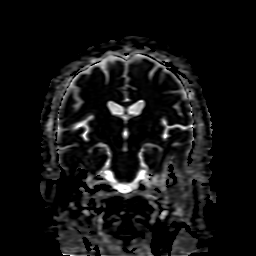
[im 36/36]
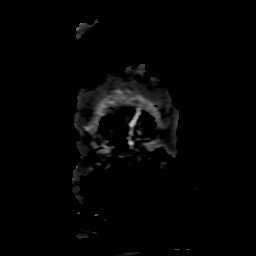

[26 of 48 positions shown; findings below may reference images not displayed]

FINDINGS: MRI HEAD FINDINGS

Brain: No acute infarction, hemorrhage, hydrocephalus, extra-axial
collection or mass lesion. Indistinct FLAIR hyperintensity in the
pons, usually from chronic microvascular ischemia. Chronic lacune in
the upper right brainstem. Age congruent brain volume.

Vascular: Normal flow voids.

Skull and upper cervical spine: Normal marrow signal.

MRI ORBITS FINDINGS

Orbits: Right cataract resection. Otherwise symmetric orbits with
normal appearance of the optic nerve sheath complexes, lacrimal
glands, extraocular muscles, and orbital fat. Unremarkable chiasm
and suprasellar cistern.

Visualized sinuses: Clear

Soft tissues: Negative
IMPRESSION: 1. No acute finding or specific cause for symptoms.
2. Chronic small vessel ischemia primarily in the pons with remote
lacunar infarct.

## 2021-07-18 IMAGING — MR MR ORBITS WO/W CM
4 of 6 series · 19 of 48 positions shown · IV contrast (gadavist)
Comparison: CT of the orbits from yesterday

CLINICAL DATA: Monocular vision loss

EXAM:
MRI HEAD AND ORBITS WITHOUT AND WITH CONTRAST
TECHNIQUE: Multiplanar, multiecho pulse sequences of the brain and surrounding
structures were obtained without and with intravenous contrast.
Multiplanar, multiecho pulse sequences of the orbits and surrounding
structures were obtained including fat saturation techniques, before
and after intravenous contrast administration.
CONTRAST:  8mL GADAVIST GADOBUTROL 1 MMOL/ML IV SOLN

[Series 10: T2 fat-sat · coronal · 4.0mm · 0.35mm/px · 8 of 22 slices shown (1 of 2)]
[im 1/22]
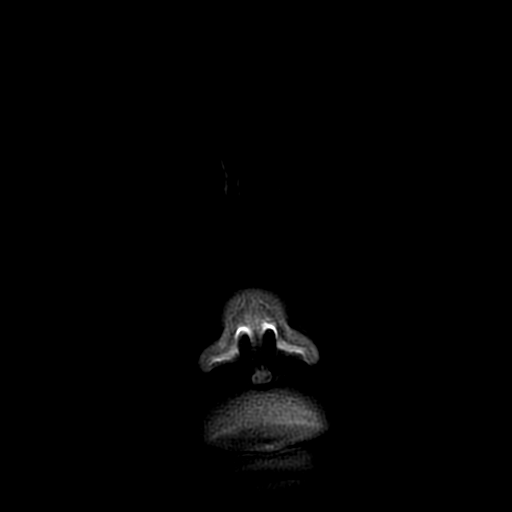
[im 4/22]
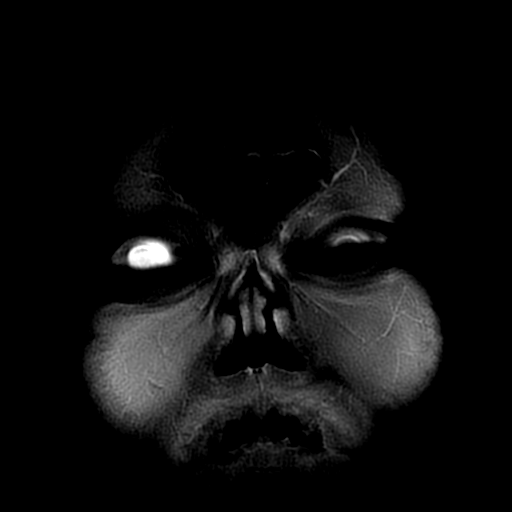
[im 7/22]
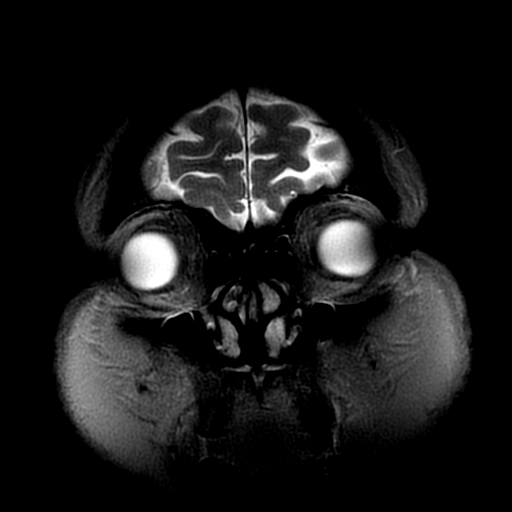
[im 10/22]
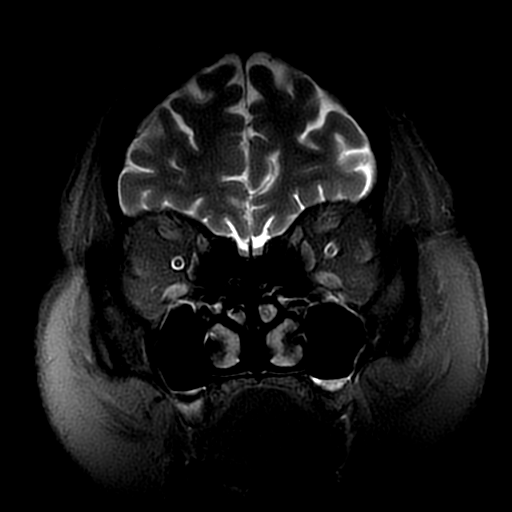
[im 13/22]
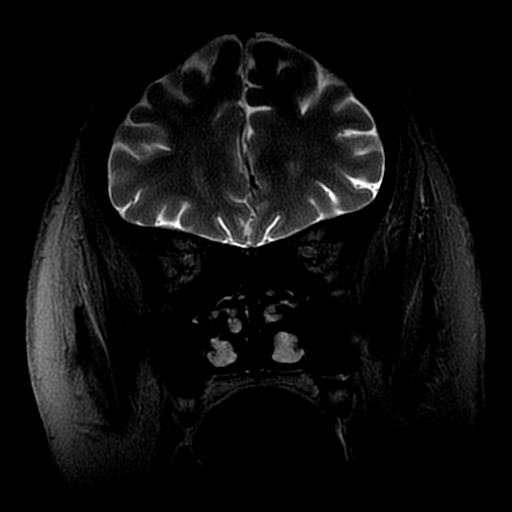
[im 16/22]
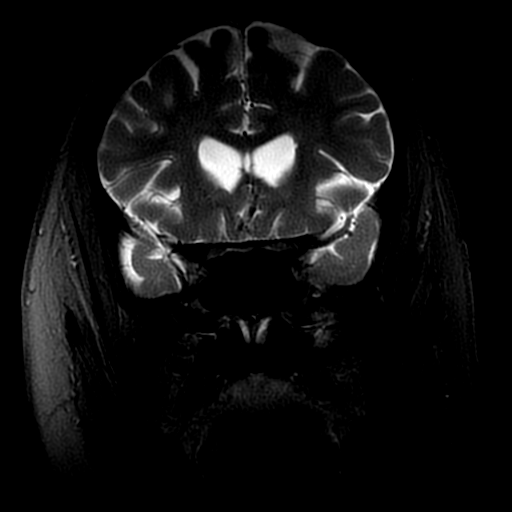
[im 19/22]
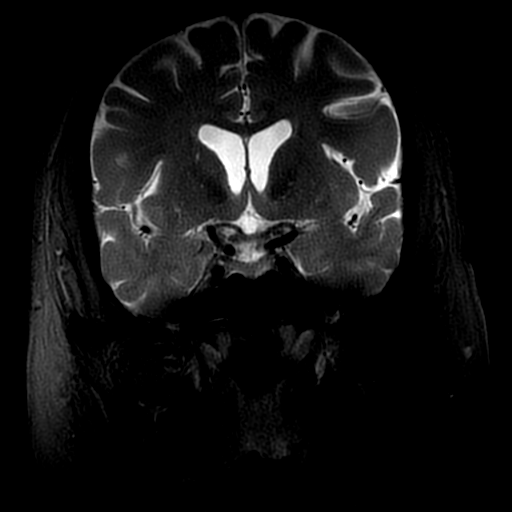
[im 22/22]
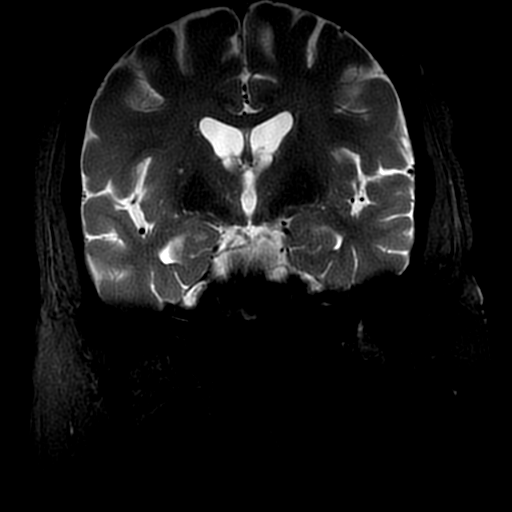

[Series 11: T2 fat-sat · axial · 3.0mm · 0.35mm/px · z∈[-65,-17]mm · 5 of 20 slices shown (2 of 2)]
[im 1/20]
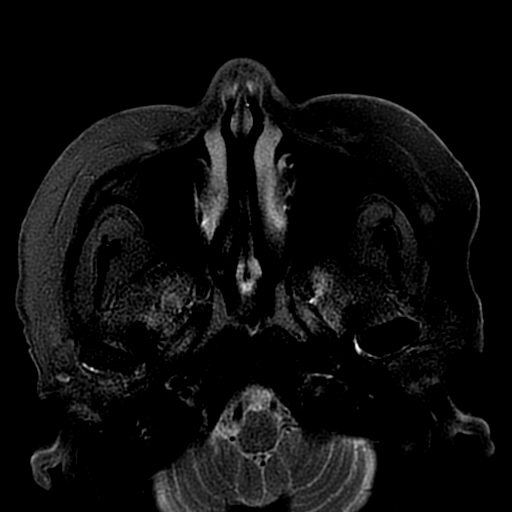
[im 3/20]
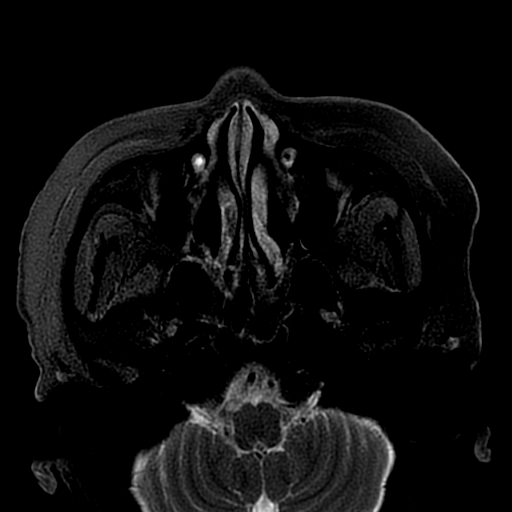
[im 6/20]
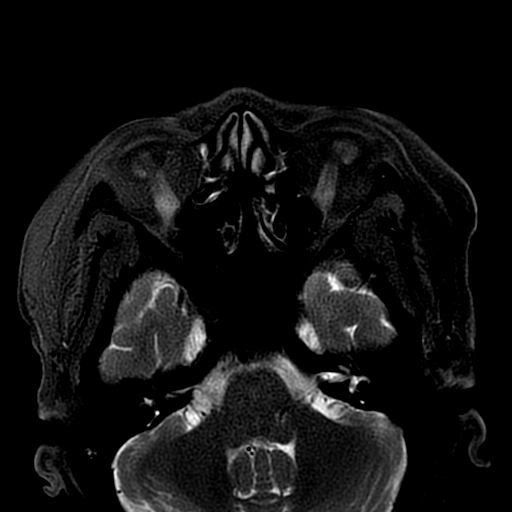
[im 11/20]
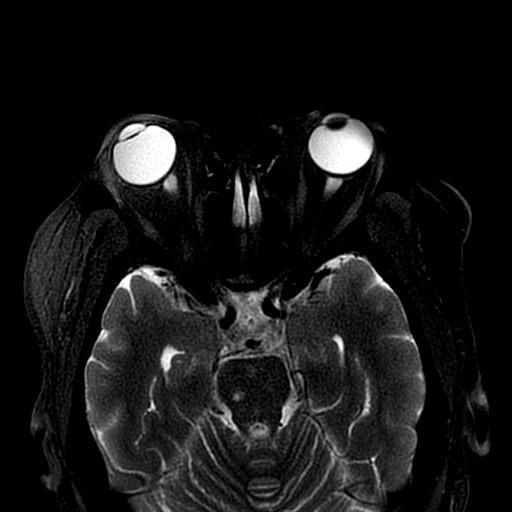
[im 17/20]
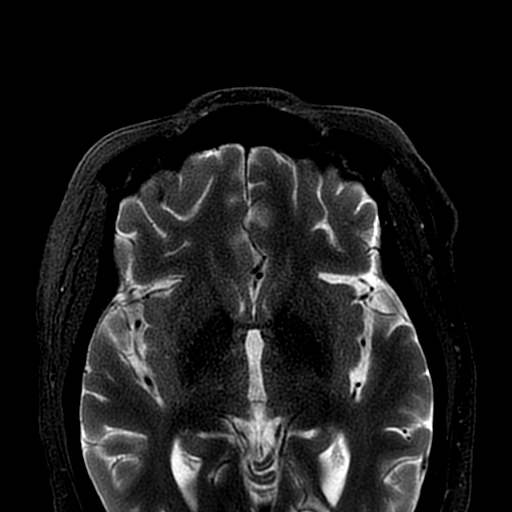

[Series 12: T1 · coronal · 4.0mm · 0.35mm/px · 3 of 22 slices shown (1 of 2)]
[im 4/22]
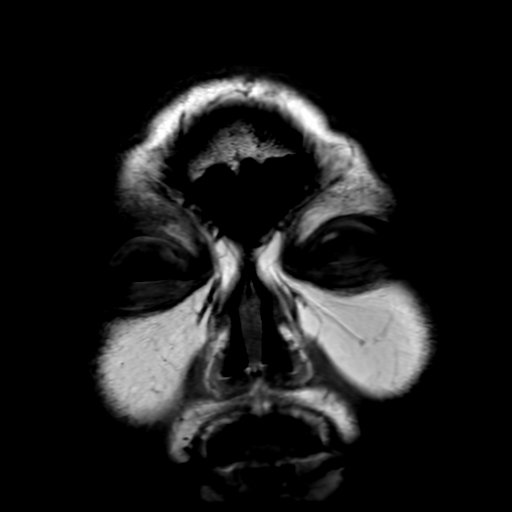
[im 13/22]
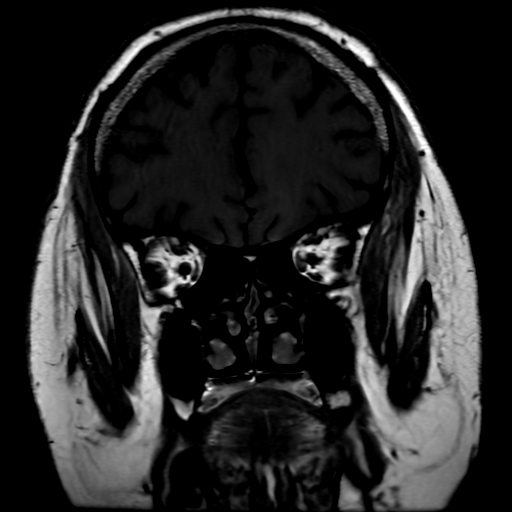
[im 19/22]
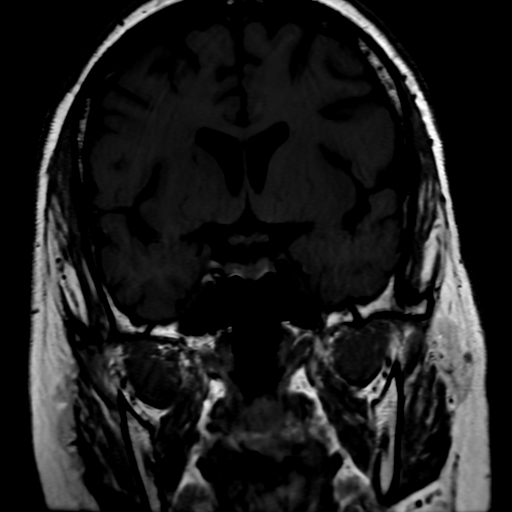

[Series 13: T1 · axial · 3.0mm · 0.35mm/px · z∈[-59,-17]mm · 3 of 20 slices shown (2 of 2)]
[im 3/20]
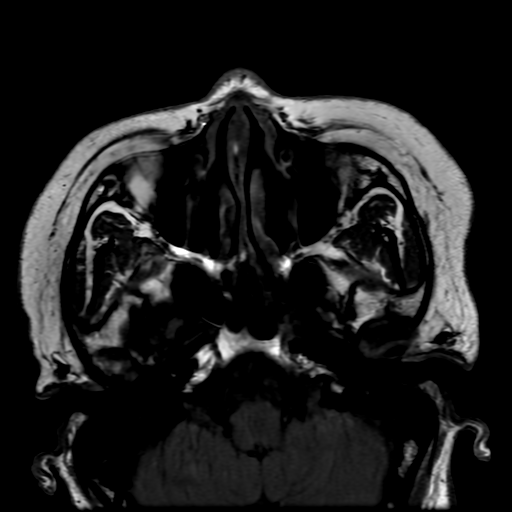
[im 11/20]
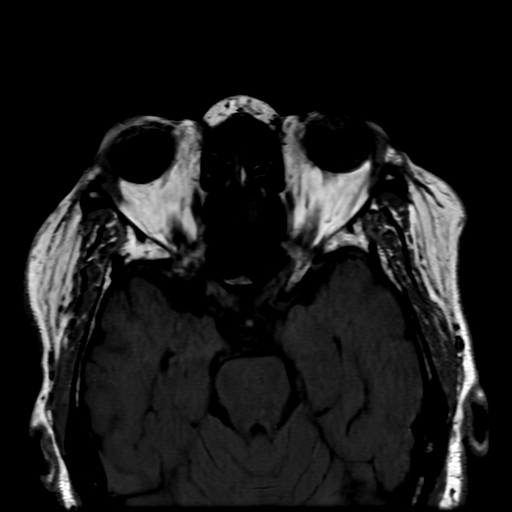
[im 17/20]
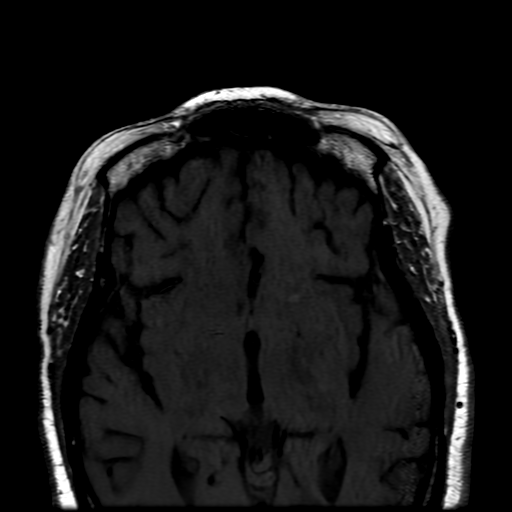

[19 of 48 positions shown; findings below may reference images not displayed]

FINDINGS: MRI HEAD FINDINGS

Brain: No acute infarction, hemorrhage, hydrocephalus, extra-axial
collection or mass lesion. Indistinct FLAIR hyperintensity in the
pons, usually from chronic microvascular ischemia. Chronic lacune in
the upper right brainstem. Age congruent brain volume.

Vascular: Normal flow voids.

Skull and upper cervical spine: Normal marrow signal.

MRI ORBITS FINDINGS

Orbits: Right cataract resection. Otherwise symmetric orbits with
normal appearance of the optic nerve sheath complexes, lacrimal
glands, extraocular muscles, and orbital fat. Unremarkable chiasm
and suprasellar cistern.

Visualized sinuses: Clear

Soft tissues: Negative
IMPRESSION: 1. No acute finding or specific cause for symptoms.
2. Chronic small vessel ischemia primarily in the pons with remote
lacunar infarct.

## 2021-07-18 MED ORDER — HYDROCHLOROTHIAZIDE 25 MG PO TABS
25.0000 mg | ORAL_TABLET | Freq: Every day | ORAL | Status: DC
  Administered 2021-07-18 – 2021-07-19 (×2): 25 mg via ORAL
  Filled 2021-07-18 (×2): qty 1

## 2021-07-18 MED ORDER — LABETALOL HCL 5 MG/ML IV SOLN
10.0000 mg | Freq: Once | INTRAVENOUS | Status: AC
  Administered 2021-07-18: 10 mg via INTRAVENOUS
  Filled 2021-07-18: qty 4

## 2021-07-18 MED ORDER — GABAPENTIN 100 MG PO CAPS
100.0000 mg | ORAL_CAPSULE | Freq: Every day | ORAL | Status: DC
  Administered 2021-07-18 – 2021-07-21 (×4): 100 mg via ORAL
  Filled 2021-07-18 (×4): qty 1

## 2021-07-18 MED ORDER — FERROUS SULFATE 325 (65 FE) MG PO TABS
650.0000 mg | ORAL_TABLET | Freq: Every day | ORAL | Status: DC
  Administered 2021-07-19 – 2021-07-22 (×3): 650 mg via ORAL
  Filled 2021-07-18 (×3): qty 2

## 2021-07-18 MED ORDER — GADOBUTROL 1 MMOL/ML IV SOLN
8.0000 mL | Freq: Once | INTRAVENOUS | Status: AC | PRN
  Administered 2021-07-18: 8 mL via INTRAVENOUS

## 2021-07-18 MED ORDER — ONDANSETRON HCL 4 MG PO TABS: 4.0000 mg | ORAL_TABLET | Freq: Four times a day (QID) | ORAL | Status: DC | PRN

## 2021-07-18 MED ORDER — INSULIN ASPART 100 UNIT/ML IJ SOLN
0.0000 [IU] | Freq: Three times a day (TID) | INTRAMUSCULAR | Status: DC
  Administered 2021-07-18: 8 [IU] via SUBCUTANEOUS
  Administered 2021-07-18: 11 [IU] via SUBCUTANEOUS
  Administered 2021-07-18: 8 [IU] via SUBCUTANEOUS
  Administered 2021-07-19 (×3): 15 [IU] via SUBCUTANEOUS
  Administered 2021-07-20 (×3): 8 [IU] via SUBCUTANEOUS

## 2021-07-18 MED ORDER — AMLODIPINE BESYLATE 5 MG PO TABS
5.0000 mg | ORAL_TABLET | Freq: Every day | ORAL | Status: DC
  Administered 2021-07-18: 5 mg via ORAL
  Filled 2021-07-18: qty 1

## 2021-07-18 MED ORDER — SODIUM CHLORIDE 0.9 % IV SOLN
250.0000 mg | Freq: Four times a day (QID) | INTRAVENOUS | Status: AC
  Administered 2021-07-18 – 2021-07-20 (×12): 250 mg via INTRAVENOUS
  Filled 2021-07-18 (×15): qty 4

## 2021-07-18 MED ORDER — INSULIN ASPART 100 UNIT/ML IJ SOLN
0.0000 [IU] | Freq: Every day | INTRAMUSCULAR | Status: DC
  Administered 2021-07-18: 3 [IU] via SUBCUTANEOUS
  Administered 2021-07-19: 5 [IU] via SUBCUTANEOUS

## 2021-07-18 MED ORDER — ACETAMINOPHEN 650 MG RE SUPP: 650.0000 mg | Freq: Four times a day (QID) | RECTAL | Status: DC | PRN

## 2021-07-18 MED ORDER — ONDANSETRON HCL 4 MG/2ML IJ SOLN: 4.0000 mg | Freq: Four times a day (QID) | INTRAMUSCULAR | Status: DC | PRN

## 2021-07-18 MED ORDER — LABETALOL HCL 5 MG/ML IV SOLN: 10.0000 mg | INTRAVENOUS | Status: DC | PRN

## 2021-07-18 MED ORDER — ACETAMINOPHEN 325 MG PO TABS
650.0000 mg | ORAL_TABLET | Freq: Four times a day (QID) | ORAL | Status: DC | PRN
  Administered 2021-07-18: 650 mg via ORAL
  Filled 2021-07-18: qty 2

## 2021-07-18 MED ORDER — LOSARTAN POTASSIUM 50 MG PO TABS
50.0000 mg | ORAL_TABLET | Freq: Every day | ORAL | Status: DC
  Administered 2021-07-19: 50 mg via ORAL
  Filled 2021-07-18: qty 1

## 2021-07-18 MED ORDER — ASPIRIN 81 MG PO CHEW
81.0000 mg | CHEWABLE_TABLET | Freq: Every day | ORAL | Status: DC
  Administered 2021-07-18 – 2021-07-22 (×5): 81 mg via ORAL
  Filled 2021-07-18 (×5): qty 1

## 2021-07-18 MED ORDER — HEPARIN SODIUM (PORCINE) 5000 UNIT/ML IJ SOLN
5000.0000 [IU] | Freq: Three times a day (TID) | INTRAMUSCULAR | Status: DC
  Administered 2021-07-18 – 2021-07-22 (×13): 5000 [IU] via SUBCUTANEOUS
  Filled 2021-07-18 (×13): qty 1

## 2021-07-18 MED ORDER — CYCLOBENZAPRINE HCL 10 MG PO TABS: 10.0000 mg | ORAL_TABLET | Freq: Two times a day (BID) | ORAL | Status: DC | PRN

## 2021-07-18 MED ORDER — ATORVASTATIN CALCIUM 40 MG PO TABS
40.0000 mg | ORAL_TABLET | Freq: Every day | ORAL | Status: DC
  Administered 2021-07-18 – 2021-07-21 (×4): 40 mg via ORAL
  Filled 2021-07-18 (×4): qty 1

## 2021-07-18 MED ORDER — AMLODIPINE BESYLATE 10 MG PO TABS
10.0000 mg | ORAL_TABLET | Freq: Every day | ORAL | Status: DC
  Administered 2021-07-18 – 2021-07-22 (×5): 10 mg via ORAL
  Filled 2021-07-18 (×5): qty 1

## 2021-07-18 MED ORDER — INSULIN GLARGINE-YFGN 100 UNIT/ML ~~LOC~~ SOLN
10.0000 [IU] | Freq: Every day | SUBCUTANEOUS | Status: DC
  Administered 2021-07-18 (×2): 10 [IU] via SUBCUTANEOUS
  Filled 2021-07-18 (×4): qty 0.1

## 2021-07-18 NOTE — Plan of Care (Cosign Needed)
Neurology Plan of [place] MR# [id number] 07/18/2021  Neurology Consult Plan of Care   Patient being followed by neurology on consult for: Vision loss in the right eye    In brief Chelsea English is a 61y.o. female with history of type 2 diabetes, hypertension, CKD stage II and left eye vision loss of unclear etiology possible cataract who presented to outside hospital ED on 07/17/2021 with a 3-day history of right sided eye pain, headache and vision changes. Per chart review, "patient reported that on May 25, she had some right eye pain with right temporal tenderness.She denies jaw claudication, proximal muscle weakness, transient weakness in other parts of body, transient episodes with vision loss.  Her daughter at bedside did note that her right eye was additionally developing some lateral disconjugate deviation, though the patient does not report diplopia this may be secondary to inability to see out of the left eye.  The patient additionally has had ptosis of the right eye.  The headache was initially intermittent in nature but has become more steady.  It is pulsating in quality and very severe in intensity.  It is not triggered by jaw movements or touching the face (although her temporal artery on the right is tender).  She notes she had laser eye surgery in [place] 6 years prior.  She came to the [place] about 3 years ago at which time she was noted to be blind in the left eye for unclear reasons  Upon arrival to ED: Patient was afebrile, pulse 86, respiratory rate 18, blood pressure 168/73, maintaining oxygen saturation on room air.  Evaluated by ophthalmology and she had dilated right eye exam.  Ophthalmology felt patient's symptoms concerning for temporal arteritis.  CT orbits: Resulted negative.  Solu-Medrol was started and patient transferred to [facility] for further evaluation and management.  work up in process including:  1.  MRI brain with without contrast done on  07/18/2021 .IMPRESSION: 1. No acute finding or specific cause for symptoms. 2. 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  MRI orbits with/without contrast done on 07/18/2021  No acute finding or specific cause for symptoms. 2. 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 -We will continue Solu-Medrol to 50 mg IV every 6 hours for 3 more days -Patient will follow-up with outpatient ophthalmologist and neurologist after discharge.     Neurology will continue to follow. Please call us with any new neurological deficits or concerns     Joelyn Oms, NP 07/18/21  2:47 PM

## 2021-07-18 NOTE — Assessment & Plan Note (Signed)
S/p temporal artery bx 5/30 Benign segment of artery, negative for arteritis CRP wnl, sed rate 78 (elevated) Normal TSH, normal ana, normal SSA/SSB, nonreactive RPR, mildly elevated RF, negative CCP Neurology recommended MRI brain with/without contrast.  MRI orbits with/without contrast.  No acute findings or cause for symptoms.  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  Steroids and outpatient f/u with ophtho  Given concerning presentation, will continue steroids at this time, will discuss further with ophtho prior to discharge

## 2021-07-18 NOTE — Assessment & Plan Note (Signed)
Baseline Scr 1.1-1.4. hold HCTZ. Hold losartan 50 mg qday with elevation in creatinine

## 2021-07-18 NOTE — Consult Note (Signed)
Neurology Consultation Reason for Consult: Vision change Requesting Physician: Ardean Larsen  CC: Vision loss in the right eye   History is obtained from: Patient, daughter at bedside and chart review; and Arabic interpreter was used  HPI: Chelsea English is a 61 y.o. female with a past medical history significant for diabetes, hypertension, left eye vision loss of unclear etiology (possible cataract).  She reports she was in her usual state of health recently, except she had some bloating and foul-smelling gas starting May 22.  Starting May 25, she had some right eye pain with right temporal tenderness.  She denies jaw claudication, proximal muscle weakness, transient weakness in other parts of body, transient episodes with vision loss.  Her daughter at bedside did note that her right eye was additionally developing some lateral disconjugate deviation, though the patient does not report diplopia this may be secondary to inability to see out of the left eye.  The patient additionally has had ptosis of the right eye.  The headache was initially intermittent in nature but has become more steady.  It is pulsating in quality and very severe in intensity.  It is not triggered by jaw movements or touching the face (although her temporal artery on the right is tender).  She notes she had laser eye surgery in [place] 6 years prior.  She came to the [place] about 3 years ago at which time she was noted to be blind in the left eye for unclear reasons   Premorbid modified rankin scale:      1 - No significant disability. Able to carry out all usual activities, despite some symptoms.    ROS: All other review of systems was negative except as noted in the HPI.   Past Medical History:  Diagnosis Date   Coronary artery disease    Diabetes mellitus without complication (HCC)    Hyperlipidemia    Hypertension    Past Surgical History:  Procedure Laterality Date   cataract Right    No current outpatient  medications  History reviewed. No pertinent family history. No family history of autoimmune disease  Social History:  reports that she has never smoked. She does not have any smokeless tobacco history on file. She reports that she does not drink alcohol and does not use drugs.   Exam: Current vital signs: BP (!) 155/64 (BP Location: Left Arm)   Pulse 79   Temp 98.4 F (36.9 C) (Oral)   Resp 17   Ht 5\' 3"  (1.6 m)   Wt 77.6 kg   SpO2 99%   BMI 30.29 kg/m  Vital signs in last 24 hours: Temp:  [98 F (36.7 C)-98.6 F (37 C)] 98.4 F (36.9 C) (05/28 0313) Pulse Rate:  [66-88] 79 (05/28 0313) Resp:  [16-19] 17 (05/28 0313) BP: (155-200)/(64-101) 155/64 (05/28 0313) SpO2:  [96 %-100 %] 99 % (05/28 0313) Weight:  [77.6 kg] 77.6 kg (05/27 1534)   Physical Exam  Constitutional: Appears well-developed and well-nourished.  Psych: Affect appropriate to situation, calm and cooperative, mildly anxious at times Eyes: No scleral injection HENT: No oropharyngeal obstruction.  MSK: no joint deformities.  Cardiovascular: Perfusing extremities well Respiratory: Effort normal, non-labored breathing GI: Soft.  No distension. There is no tenderness.  Skin: Warm dry and intact visible skin  Neuro: Mental Status: Patient is awake, alert, oriented to person, place, month, year, and situation. Patient is able to give a clear and coherent history. No signs of aphasia or neglect on the limits of  the examination being conducted with the assistance of an interpreter using a different Arabic dialect than the patient Cranial Nerves: II: Visual Fields are full.  She has significant light sensitivity and difficulty tolerating examination of the right eye, but the pupil is reactive.  Notably the left eye demonstrates cloudy cataract though pupil is still responsive III,IV, VI: EOMI, but there is significant ptosis on the right side V: Facial sensation is symmetric to temperature VII: Facial movement  is symmetric.  VIII: hearing is intact to voice X: Uvula elevates symmetrically XI: Shoulder shrug is symmetric. XII: tongue is midline without atrophy or fasciculations.  Motor: Tone is normal. Bulk is normal. 5/5 strength was present in all four extremities.  Sensory: Sensation is symmetric to light touch in the arms and legs. Deep Tendon Reflexes: 1+ and symmetric in the brachioradialis and patellae.  Plantars: Toes are downgoing bilaterally.  Gait:  Deferred   I have reviewed labs in epic and the results pertinent to this consultation are:  Basic Metabolic Panel: Recent Labs  Lab 07/17/21 1732 07/18/21 0322  NA 134* 136  K 4.8 4.9  CL 104 105  CO2 24 21*  GLUCOSE 221* 216*  BUN 29* 27*  CREATININE 1.29* 1.38*  CALCIUM 9.4 9.6  MG  --  1.9    CBC: Recent Labs  Lab 07/17/21 1732 07/18/21 0322  WBC 12.4* 13.2*  NEUTROABS 8.2* 11.8*  HGB 10.4* 9.6*  HCT 31.6* 30.0*  MCV 83.2 83.3  PLT 285 270    Coagulation Studies: No results for input(s): LABPROT, INR in the last 72 hours.    I have reviewed the images obtained: CT orbits negative   Impression: This is a 61 year old woman with past medical history significant for diabetes and hypertension as well as vision loss in the left eye likely secondary to cataract presenting with vision loss of the right eye with unclear etiology.  Agree with ophthalmology that GCS is on the differential given her monocular vision agree with steroids.  However on my examination she additionally has ptosis of the right eye, lateral deviation of the right eye, and mild hearing loss on the right ear.  This is concerning for multiple cranial things for which the differential is very broad.  Recommendations: -MRI brain with and without contrast -MRI orbits with and without contrast -Lyme,  ANA, RF, CCP, HIV, RPR as a starting serological screen -Continue steroids as outlined by telespecialists pending MRI brain -Neurology will  follow  Brooke Dare MD-PhD [facility] [phone] Available 7 PM to 7 AM, outside of these hours please call Neurologist on call as listed on Amion.

## 2021-07-18 NOTE — Assessment & Plan Note (Signed)
Uncontrolled. A1c 8.8 from 07-12-2021 labs.  Add lantus 10 units qhs. Add SSI.

## 2021-07-18 NOTE — Assessment & Plan Note (Signed)
Stable

## 2021-07-18 NOTE — H&P (Signed)
History and Physical    Chelsea ExonMarta English MVH:[id number] DOB: 04/18/1960 DOA: 07/17/2021  DOS: the patient was seen and examined on 07/17/2021  PCP: Freddrick MarchBurgoa-Rio, Carlos Fernan, PA-C   Patient coming from: Home  I have personally briefly reviewed patient's [facility]  CC: right eye pain, right sided headache, right eye vision changes HPI: 61 year old Arabic female with a history of type 2 diabetes, hypertension, CKD stage II presents to the ER on 07/17/2021 with a 3-day history of right-sided eye pain, headache and vision changes.  Patient is legally blind in her left eye.  She was seen by ophthalmology in the ER.  Her right pupil was dilated.  Funduscopic examination by the ophthalmologist showed sharp margins.  No evidence of glaucoma.  Ophthalmologist concerned about temporal arteritis and recommended hospital admission and IV steroids.  Patient denies any fever, chills, chest pain, shortness of breath.  Patient waited in the [facility] ER prior to [facility].  She received 1000 mg of IV Solu-Medrol yesterday on 07/17/2021 at 2144.    ED Course: Ophthalmology consulted.  She had a dilated eye examination of her right eye.  Ophthalmology felt patient had symptoms concerning for temporal arteritis.  Solu-Medrol was started.  [facility]  Review of Systems:  Review of Systems  Constitutional:  Negative for chills and fever.  HENT:         Patient had a buzzing-like sensation out of her right ear along with right eye pain.  Eyes:  Positive for pain.  Respiratory:  Negative for cough, hemoptysis, sputum production and shortness of breath.   Cardiovascular:  Negative for chest pain and palpitations.  Gastrointestinal:  Negative for abdominal pain, heartburn and nausea.  Genitourinary: Negative.   Musculoskeletal: Negative.   Skin: Negative.   Neurological:  Positive for headaches.  Endo/Heme/Allergies: Negative.    Psychiatric/Behavioral: Negative.    All other systems reviewed and are negative.  Past Medical History:  Diagnosis Date   Coronary artery disease    Diabetes mellitus without complication (HCC)    Hyperlipidemia    Hypertension     Past Surgical History:  Procedure Laterality Date   cataract Right      reports that she has never smoked. She does not have any smokeless tobacco history on file. She reports that she does not drink alcohol and does not use drugs.  Allergies  Allergen Reactions   Penicillins Anaphylaxis    History reviewed. No pertinent family history.  Prior to Admission medications   Not on File    Physical Exam: Vitals:   07/17/21 1952 07/17/21 2143 07/18/21 0024 07/18/21 0059  BP: (!) 176/101 (!) 159/82 (!) 200/88 (!) 185/77  Pulse: 72 87 88 82  Resp: 18 18 16 16   Temp:   98 F (36.7 C) 98.6 F (37 C)  TempSrc:   Oral Oral  SpO2: 100% 100% 100% 96%  Weight:      Height:        Physical Exam Vitals and nursing note reviewed.  Constitutional:      General: She is not in acute distress.    Appearance: She is obese. She is not ill-appearing, toxic-appearing or diaphoretic.  HENT:     Head: Normocephalic and atraumatic.     Nose: Nose normal. No rhinorrhea.  Eyes:     Comments: Right pupil is dilated to about 3 to 4 mm.  Minimally reactive to light.  Left pupil is cloudy  consistent with her prior history of blindness in her left eye.  ED chart notes the patient had her right pupil dilated with medications per ophthalmology.  Cardiovascular:     Rate and Rhythm: Normal rate and regular rhythm.  Pulmonary:     Effort: Pulmonary effort is normal. No respiratory distress.     Breath sounds: No wheezing or rales.  Abdominal:     General: Bowel sounds are normal. There is no distension.     Tenderness: There is no abdominal tenderness. There is no guarding.  Musculoskeletal:     Right lower leg: No edema.     Left lower leg: No edema.   Skin:    General: Skin is warm and dry.     Capillary Refill: Capillary refill takes less than 2 seconds.  Neurological:     Mental Status: She is alert and oriented to person, place, and time.     Labs on Admission: I have personally reviewed following labs and imaging studies  CBC: Recent Labs  Lab 07/17/21 1732  WBC 12.4*  NEUTROABS 8.2*  HGB 10.4*  HCT 31.6*  MCV 83.2  PLT [id number]   Basic Metabolic Panel: Recent Labs  Lab 07/17/21 1732  NA 134*  K 4.8  CL 104  CO2 24  GLUCOSE 221*  BUN 29*  CREATININE 1.29*  CALCIUM 9.4   GFR: Estimated Creatinine Clearance: 45.2 mL/min (A) (by C-G formula based on SCr of 1.29 mg/dL (H)). Liver Function Tests: No results for input(s): AST, ALT, ALKPHOS, BILITOT, PROT, ALBUMIN in the last 168 hours. No results for input(s): LIPASE, AMYLASE in the last 168 hours. No results for input(s): AMMONIA in the last 168 hours. Coagulation Profile: No results for input(s): INR, PROTIME in the last 168 hours. Cardiac Enzymes: No results for input(s): CKTOTAL, CKMB, CKMBINDEX, TROPONINI, TROPONINIHS in the last 168 hours. BNP (last 3 results) No results for input(s): PROBNP in the last 8760 hours. HbA1C: No results for input(s): HGBA1C in the last 72 hours. CBG: No results for input(s): GLUCAP in the last 168 hours. Lipid Profile: No results for input(s): CHOL, HDL, LDLCALC, TRIG, CHOLHDL, LDLDIRECT in the last 72 hours. Thyroid Function Tests: No results for input(s): TSH, T4TOTAL, FREET4, T3FREE, THYROIDAB in the last 72 hours. Anemia Panel: No results for input(s): VITAMINB12, FOLATE, FERRITIN, TIBC, IRON, RETICCTPCT in the last 72 hours. Urine analysis: No results found for: COLORURINE, APPEARANCEUR, LABSPEC, [place], GLUCOSEU, HGBUR, BILIRUBINUR, KETONESUR, PROTEINUR, UROBILINOGEN, NITRITE, LEUKOCYTESUR  Radiological Exams on Admission: I have personally reviewed images CT Orbits W Contrast  Result Date: 07/17/2021 CLINICAL DATA:   Orbital cellulitis EXAM: CT ORBITS WITH CONTRAST TECHNIQUE: Multidetector CT images was performed according to the standard protocol following intravenous contrast administration. RADIATION DOSE REDUCTION: This exam was performed according to the departmental dose-optimization program which includes automated exposure control, adjustment of the mA and/or kV according to patient size and/or use of iterative reconstruction technique. CONTRAST:  31mL OMNIPAQUE IOHEXOL 300 MG/ML  SOLN COMPARISON:  None Available. FINDINGS: Orbits: No orbital mass or evidence of inflammation. Normal appearance of the globes, optic nerve-sheath complexes, extraocular muscles, orbital fat and lacrimal glands. Visible paranasal sinuses: Clear. Soft tissues: Normal. Osseous: No fracture or aggressive lesion. Limited intracranial: No acute or significant finding. IMPRESSION: Unremarkable CT of the orbits. No visible edema or fluid collection. Electronically Signed   By: Margaretha Sheffield M.D.   On: 07/17/2021 18:50    EKG: My personal interpretation of EKG shows: NSR  Assessment/Plan Principal Problem:   Vision changes - concern for right side temporal arteritis Active Problems:   Essential hypertension   GERD (gastroesophageal reflux disease)   Type 2 diabetes mellitus with diabetic polyneuropathy, with long-term current use of insulin (HCC)   CKD (chronic kidney disease) stage 2, GFR 60-89 ml/min    Assessment and Plan: * Vision changes - concern for right side temporal arteritis Admit to observation. IV solumedrol 250 mg q6h x 12 doses per ophthal recs. Vision changes is a Acute illness/condition that poses a threat to life or bodily function.  Neurology to see patient. Pt may need temporal artery biopsy to confirm diagnosis.  CKD (chronic kidney disease) stage 2, GFR 60-89 ml/min Baseline Scr 1.1-1.4. hold HCTZ. Continue losartan 50 mg qday.  Type 2 diabetes mellitus with diabetic polyneuropathy, with long-term  current use of insulin (HCC) Uncontrolled. A1c 8.8 from 07-12-2021 labs.  Add lantus 10 units qhs. Add SSI.  GERD (gastroesophageal reflux disease) Stable.  Essential hypertension Uncontrolled. Labetalol 10 mg IV x 1 now. Continue with losartan 50 mg qd. Increase norvasc to 10 mg qd. Add labetalol 10 mg q4h prn SBP>170. Hold HCTZ due to CKD stage 2.   DVT prophylaxis: SQ Heparin Code Status: Full Code Family Communication: no family at bedside  Disposition Plan: return home  Consults called: neurology  Admission status: Observation, Med-Surg   Kristopher Oppenheim, DO [facility] 07/18/2021, 1:47 AM

## 2021-07-18 NOTE — Progress Notes (Addendum)
PROGRESS NOTE    Chelsea English  [id number] DOB: 08/14/1960 DOA: 07/17/2021 PCP: Donnal Debar, PA-C   Brief Narrative:  61 year old Arabic female with a history of type 2 diabetes, hypertension, CKD stage II presents to the ER on 07/17/2021 with a 3-day history of right-sided eye pain, headache and vision changes.   Upon arrival to ED: Patient was afebrile, pulse 86, respiratory rate 18, blood pressure 168/73, maintaining oxygen saturation on room air.  Evaluated by ophthalmology and she had dilated right eye exam.  Ophthalmology felt pins patient's symptoms concerning for temporal arteritis.  CT orbits: Resulted negative.  Solu-Medrol was started and patient transferred to [facility] for further evaluation and management.   Assessment & Plan:   Headache with right-sided vision loss: -Broad differential diagnosis including temporal arteritis versus stroke? -CT orbits negative for any acute findings. -Evaluated by ophthalmology in ED-concern for temporal arteritis since sed rate was high at 78.  CRP: WNL.  Patient started on Solu-Medrol which we will continue -Appreciate neurology recommendation-we will get MRI brain and orbits with and without contrast. -Check Lyme, ANA, RF, CCP, HIV, RPR-all pending at this time -Neuro check -Monitor vitals -Fall precautions  Uncontrolled type 2 diabetes with polyneuropathy: -On metformin, Januvia and insulin at home.  A1c 8.8%.   -Continue sliding scale insulin and Lantus 10 units nightly -We closely monitor her blood sugar since she is on steroids  Cataract in left eye: Mild nonproliferative diabetic retinopathy Hypertensive retinopathy: -Follow-up with ophthalmology outpatient  CKD stage IIIb: -Continue to monitor renal function closely.  Avoid nephrotoxic medications.  Diabetic polyneuropathy: Continue gabapentin  Hypertension: Uncontrolled -Continue amlodipine and hydrochlorothiazide and monitor blood pressure closely.   Continue labetalol as needed  Hyperlipidemia: Continue statin  Leukocytosis: Likely secondary to steroids.  Continue to monitor.  She is afebrile.  Anemia of chronic disease: H&H is stable.  Continue to monitor  Chronic back pain: Continue Flexeril as needed  Obesity with BMI of 30: Diet modification, exercise and weight loss recommended  History of systolic murmur and first-degree AV block: -Patient is asymptomatic.  Follow-up outpatient.  DVT prophylaxis: SCD, heparin Code Status: Full code Family Communication: Patient's daughter present at bedside.  Plan of care discussed with patient in length and she verbalized understanding and agreed with it. Disposition Plan: To be determined  Consultants:  Neurology Ophthalmology  Procedures:  None  Antimicrobials:  None  Status is: Inpatient    Subjective: Patient seen and examined.  Daughter at the bedside.  The patient does not speak [place].  History gathered from patient's daughter.  Patient reports that her headache has improved since yesterday however she continues to have right-sided blurry vision.  No chest pain, shortness of breath, fever, chills.  Objective: Vitals:   07/18/21 0024 07/18/21 0059 07/18/21 0313 07/18/21 0839  BP: (!) 200/88 (!) 185/77 (!) 155/64 (!) 145/72  Pulse: 88 82 79 87  Resp: 16 16 17 17   Temp: 98 F (36.7 C) 98.6 F (37 C) 98.4 F (36.9 C) 98 F (36.7 C)  TempSrc: Oral Oral Oral Oral  SpO2: 100% 96% 99% 96%  Weight:      Height:        Intake/Output Summary (Last 24 hours) at 07/18/2021 1024 Last data filed at 07/18/2021 0309 Gross per 24 hour  Intake 365.55 ml  Output --  Net 365.55 ml   Filed Weights   07/17/21 1534  Weight: 77.6 kg    Examination:  General exam: Appears calm and comfortable,  on room air, obese, daughter at the bedside. Cataract noted in left eye, ptosis and right eye Respiratory system: Clear to auscultation. Respiratory effort normal. Cardiovascular  system: S1 & S2 heard, RRR. No JVD, systolic murmur, no pedal edema.   Gastrointestinal system: Abdomen is nondistended, soft and nontender. No organomegaly or masses felt. Normal bowel sounds heard. Central nervous system: Alert and oriented.  Power 5 out of 5 in all 4 extremities, sensation intact.   Extremities: Symmetric 5 x 5 power. Skin: No rashes, lesions or ulcers   Data Reviewed: I have personally reviewed following labs and imaging studies  CBC: Recent Labs  Lab 07/17/21 1732 07/18/21 0322  WBC 12.4* 13.2*  NEUTROABS 8.2* 11.8*  HGB 10.4* 9.6*  HCT 31.6* 30.0*  MCV 83.2 83.3  PLT 285 [id number]   Basic Metabolic Panel: Recent Labs  Lab 07/17/21 1732 07/18/21 0322  NA 134* 136  K 4.8 4.9  CL 104 105  CO2 24 21*  GLUCOSE 221* 216*  BUN 29* 27*  CREATININE 1.29* 1.38*  CALCIUM 9.4 9.6  MG  --  1.9   GFR: Estimated Creatinine Clearance: 42.2 mL/min (A) (by C-G formula based on SCr of 1.38 mg/dL (H)). Liver Function Tests: Recent Labs  Lab 07/18/21 0322  AST 21  ALT 20  ALKPHOS 116  BILITOT 0.4  PROT 7.9  ALBUMIN 3.6   No results for input(s): LIPASE, AMYLASE in the last 168 hours. No results for input(s): AMMONIA in the last 168 hours. Coagulation Profile: No results for input(s): INR, PROTIME in the last 168 hours. Cardiac Enzymes: No results for input(s): CKTOTAL, CKMB, CKMBINDEX, TROPONINI in the last 168 hours. BNP (last 3 results) No results for input(s): PROBNP in the last 8760 hours. HbA1C: Recent Labs    07/18/21 0322  HGBA1C 8.8*   CBG: Recent Labs  Lab 07/18/21 0153 07/18/21 0840  GLUCAP 209* 278*   Lipid Profile: Recent Labs    07/18/21 0322  CHOL 152  HDL 27*  LDLCALC 94  TRIG 156*  CHOLHDL 5.6   Thyroid Function Tests: Recent Labs    07/18/21 0322  TSH 1.502   Anemia Panel: No results for input(s): VITAMINB12, FOLATE, FERRITIN, TIBC, IRON, RETICCTPCT in the last 72 hours. Sepsis Labs: No results for input(s):  PROCALCITON, LATICACIDVEN in the last 168 hours.  No results found for this or any previous visit (from the past 240 hour(s)).    Radiology Studies: CT Orbits W Contrast  Result Date: 07/17/2021 CLINICAL DATA:  Orbital cellulitis EXAM: CT ORBITS WITH CONTRAST TECHNIQUE: Multidetector CT images was performed according to the standard protocol following intravenous contrast administration. RADIATION DOSE REDUCTION: This exam was performed according to the departmental dose-optimization program which includes automated exposure control, adjustment of the mA and/or kV according to patient size and/or use of iterative reconstruction technique. CONTRAST:  66mL OMNIPAQUE IOHEXOL 300 MG/ML  SOLN COMPARISON:  None Available. FINDINGS: Orbits: No orbital mass or evidence of inflammation. Normal appearance of the globes, optic nerve-sheath complexes, extraocular muscles, orbital fat and lacrimal glands. Visible paranasal sinuses: Clear. Soft tissues: Normal. Osseous: No fracture or aggressive lesion. Limited intracranial: No acute or significant finding. IMPRESSION: Unremarkable CT of the orbits. No visible edema or fluid collection. Electronically Signed   By: Margaretha Sheffield M.D.   On: 07/17/2021 18:50    Scheduled Meds:  amLODipine  10 mg Oral Daily   heparin  5,000 Units Subcutaneous Q8H   insulin aspart  0-15 Units Subcutaneous TID WC  insulin aspart  0-5 Units Subcutaneous QHS   insulin glargine-yfgn  10 Units Subcutaneous QHS   [START ON 07/19/2021] losartan  50 mg Oral Daily   Continuous Infusions:  sodium chloride Stopped (07/17/21 2339)   methylPREDNISolone (SOLU-MEDROL) injection 250 mg (07/18/21 0911)     LOS: 0 days   Time spent: 35 minutes   Holdyn Poyser Loann Quill, MD [facility]  If 7PM-7AM, please contact night-coverage [email or web address] 07/18/2021, 10:24 AM

## 2021-07-18 NOTE — Subjective & Objective (Signed)
CC: right eye pain, right sided headache, right eye vision changes HPI: 62 year old Arabic female with a history of type 2 diabetes, hypertension, CKD stage II presents to the ER on 07/17/2021 with a 3-day history of right-sided eye pain, headache and vision changes.  Patient is legally blind in her left eye.  She was seen by ophthalmology in the ER.  Her right pupil was dilated.  Funduscopic examination by the ophthalmologist showed sharp margins.  No evidence of glaucoma.  Ophthalmologist concerned about temporal arteritis and recommended hospital admission and IV steroids.  Patient denies any fever, chills, chest pain, shortness of breath.  Patient waited in the [facility] ER prior to transfer to [facility].  She received 1000 mg of IV Solu-Medrol yesterday on 07/17/2021 at 2144.

## 2021-07-18 NOTE — Consult Note (Incomplete)
Neurology Consultation Reason for Consult: *** Requesting Physician: ***  CC: ***  History is obtained from:***  HPI: Chelsea English is a 61 y.o. female with a PMHx of HTN, DM2, aortic stenosis,   Blind in the left eye at least since arrival to the [place] 3 years ago, unknown etiology Headache for the past 3 days, initially intermittent, last night became more constant. Pulsating in quality. Pain w/ eye movement to the right, can't see well.  Eating and drinking well No prior kidney issues Temporal artery tenderness No jaw claudication No muscle weakness   Lab Results  Component Value Date   ESRSEDRATE 59 (H) 07/17/2021     LKW: *** tPA given?: No, or if yes, time given *** IA performed?: No, or if yes, groin puncture time: *** Premorbid modified rankin scale: ***     0 - No symptoms.     1 - No significant disability. Able to carry out all usual activities, despite some symptoms.     2 - Slight disability. Able to look after own affairs without assistance, but unable to carry out all previous activities.     3 - Moderate disability. Requires some help, but able to walk unassisted.     4 - Moderately severe disability. Unable to attend to own bodily needs without assistance, and unable to walk unassisted.     5 - Severe disability. Requires constant nursing care and attention, bedridden, incontinent.     6 - Dead. ICH Score: ***  Time performed: *** GCS: {Blank single:19197::"3-4 is 2 points","5-12 is 1 point","13-15 is 0 points"} Infratentorial: {yes no:314532}. If yes, 1 point Volume: {Blank single:19197::">30cc is 1 point","<30cc is 0 points"}  Age: 61 y.o.. >80 is 1 point Intraventricular extension is 1 point  Score:***  A Score of {Blank single:19197::"0 points has a 30 day mortality of 0%","1 points has a 30 day mortality of 13%","2 points has a 30 day mortality of 26%","3 points has a 30 day mortality of 72%","4 points has a 30 day mortality of 97%","5-6 points has a  30 day mortality of 100%"}. Stroke. 2001 Apr;32(4):891-7.    ROS: All other review of systems was negative except as noted in the HPI. *** Unable to obtain due to altered mental status.   Past Medical History:  Diagnosis Date   Coronary artery disease    Diabetes mellitus without complication (HCC)    Hyperlipidemia    Hypertension    ***  History reviewed. No pertinent family history. ***  Social History:  reports that she has never smoked. She does not have any smokeless tobacco history on file. She reports that she does not drink alcohol and does not use drugs. ***  Exam: Current vital signs: BP (!) 155/64 (BP Location: Left Arm)   Pulse 79   Temp 98.4 F (36.9 C) (Oral)   Resp 17   Ht 5\' 3"  (1.6 m)   Wt 77.6 kg   SpO2 99%   BMI 30.29 kg/m  Vital signs in last 24 hours: Temp:  [98 F (36.7 C)-98.6 F (37 C)] 98.4 F (36.9 C) (05/28 0313) Pulse Rate:  [66-88] 79 (05/28 0313) Resp:  [16-19] 17 (05/28 0313) BP: (155-200)/(64-101) 155/64 (05/28 0313) SpO2:  [96 %-100 %] 99 % (05/28 0313) Weight:  [77.6 kg] 77.6 kg (05/27 1534)   Physical Exam  Constitutional: Appears well-developed and well-nourished.  Psych: Affect appropriate to situation, *** Eyes: No scleral injection HENT: No oropharyngeal obstruction.  MSK: no joint deformities.  Cardiovascular: Normal rate and regular rhythm. *** Perfusing extremities well Respiratory: Effort normal, non-labored breathing GI: Soft.  No distension. There is no tenderness.  Skin: Warm dry and intact visible skin  Neuro: Mental Status: Patient is awake, alert, oriented to person, place, month, year, and situation.*** Patient is able to give a clear and coherent history.*** No signs of aphasia or neglect*** Cranial Nerves: II: Visual Fields are full. Pupils are equal, round, and reactive to light.  *** III,IV, VI: EOMI without ptosis or diploplia.  V: Facial sensation is symmetric to temperature VII: Facial movement  is symmetric.  VIII: hearing is intact to voice X: Uvula elevates symmetrically XI: Shoulder shrug is symmetric. XII: tongue is midline without atrophy or fasciculations.  Motor: Tone is normal. Bulk is normal. 5/5 strength was present in all four extremities. *** Sensory: Sensation is symmetric to light touch and temperature in the arms and legs.*** Deep Tendon Reflexes: 2+ and symmetric in the brachioradialis and patellae. *** Plantars: Toes are downgoing bilaterally. *** Cerebellar: FNF and HKS are intact bilaterally*** Gait:  Deferred in acute setting ***  NIHSS total *** Score breakdown: *** Performed at *** time of patient arrival to ED    I have reviewed labs in epic and the results pertinent to this consultation are: ***  I have reviewed the images obtained:***   Impression: ***  Recommendations: - ***   Brooke Dare MD-PhD [facility] [phone]   *** [facility], [facility], Teleneuro    Total critical care time: *** minutes   Critical care time was exclusive of separately billable procedures and treating other patients.   Critical care was necessary to treat or prevent imminent or life-threatening deterioration.   Critical care was time spent personally by me on the following activities: development of treatment plan with patient and/or surrogate as well as nursing, discussions with consultants/primary team, evaluation of patient's response to treatment, examination of patient, obtaining history from patient or surrogate, ordering and performing treatments and interventions, ordering and review of laboratory studies, ordering and review of radiographic studies, and re-evaluation of patient's condition as needed, as documented above.

## 2021-07-18 NOTE — Assessment & Plan Note (Signed)
Uncontrolled. Labetalol 10 mg IV x 1 now. Continue with losartan 50 mg qd. Increase norvasc to 10 mg qd. Add labetalol 10 mg q4h prn SBP>170. Hold HCTZ due to CKD stage 2.

## 2021-07-19 DIAGNOSIS — R519 Headache, unspecified: Secondary | ICD-10-CM | POA: Diagnosis not present

## 2021-07-19 DIAGNOSIS — H538 Other visual disturbances: Secondary | ICD-10-CM | POA: Diagnosis not present

## 2021-07-19 DIAGNOSIS — N182 Chronic kidney disease, stage 2 (mild): Secondary | ICD-10-CM | POA: Diagnosis not present

## 2021-07-19 LAB — BASIC METABOLIC PANEL
Anion gap: 11 (ref 5–15)
BUN: 41 mg/dL — ABNORMAL HIGH (ref 8–23)
CO2: 22 mmol/L (ref 22–32)
Calcium: 10.1 mg/dL (ref 8.9–10.3)
Chloride: 103 mmol/L (ref 98–111)
Creatinine, Ser: 1.53 mg/dL — ABNORMAL HIGH (ref 0.44–1.00)
GFR, Estimated: 38 mL/min — ABNORMAL LOW (ref >60)
Glucose, Bld: 399 mg/dL — ABNORMAL HIGH (ref 70–99)
Potassium: 4.4 mmol/L (ref 3.5–5.1)
Sodium: 136 mmol/L (ref 135–145)

## 2021-07-19 LAB — CBC
HCT: 29.5 % — ABNORMAL LOW (ref 36.0–46.0)
Hemoglobin: 9.6 g/dL — ABNORMAL LOW (ref 12.0–15.0)
MCH: 26.9 pg (ref 26.0–34.0)
MCHC: 32.5 g/dL (ref 30.0–36.0)
MCV: 82.6 fL (ref 80.0–100.0)
Platelets: 269 10*3/uL (ref 150–400)
RBC: 3.57 MIL/uL — ABNORMAL LOW (ref 3.87–5.11)
RDW: 12.5 % (ref 11.5–15.5)
WBC: 14.2 10*3/uL — ABNORMAL HIGH (ref 4.0–10.5)
nRBC: 0 % (ref 0.0–0.2)

## 2021-07-19 LAB — GLUCOSE, CAPILLARY
Glucose-Capillary: 401 mg/dL — ABNORMAL HIGH (ref 70–99)
Glucose-Capillary: 363 mg/dL — ABNORMAL HIGH (ref 70–99)
Glucose-Capillary: 356 mg/dL — ABNORMAL HIGH (ref 70–99)
Glucose-Capillary: 414 mg/dL — ABNORMAL HIGH (ref 70–99)

## 2021-07-19 LAB — RPR: RPR Ser Ql: NONREACTIVE

## 2021-07-19 MED ORDER — POLYETHYLENE GLYCOL 3350 17 G PO PACK
17.0000 g | PACK | Freq: Every day | ORAL | Status: DC
  Administered 2021-07-19 – 2021-07-21 (×2): 17 g via ORAL
  Filled 2021-07-19 (×2): qty 1

## 2021-07-19 MED ORDER — INSULIN GLARGINE-YFGN 100 UNIT/ML ~~LOC~~ SOLN
15.0000 [IU] | Freq: Every day | SUBCUTANEOUS | Status: DC
Start: 2021-07-19 — End: 2021-07-20
  Administered 2021-07-19: 15 [IU] via SUBCUTANEOUS
  Filled 2021-07-19 (×2): qty 0.15

## 2021-07-19 MED ORDER — MELATONIN 3 MG PO TABS
3.0000 mg | ORAL_TABLET | Freq: Every day | ORAL | Status: DC
Start: 2021-07-19 — End: 2021-07-22
  Administered 2021-07-19 – 2021-07-21 (×3): 3 mg via ORAL
  Filled 2021-07-19 (×5): qty 1

## 2021-07-19 NOTE — Progress Notes (Signed)
PROGRESS NOTE    Chelsea English  [id number] DOB: [birth year] DOA: 07/17/2021 PCP: Donnal Debar, PA-C   Brief Narrative:  61 year old Arabic female with a history of type 2 diabetes, hypertension, CKD stage II presents to the ER on 07/17/2021 with a 3-day history of right-sided eye pain, headache and vision changes.   Upon arrival to ED: Patient was afebrile, pulse 86, respiratory rate 18, blood pressure 168/73, maintaining oxygen saturation on room air.  Evaluated by ophthalmology and she had dilated right eye exam.  Ophthalmology felt pins patient's symptoms concerning for temporal arteritis.  CT orbits: Resulted negative.  Solu-Medrol was started and patient transferred to [facility] for further evaluation and management.   Assessment & Plan:   Headache with right-sided vision loss: -Could be due to temporal arteritis? -CT orbits, MRI brain and MRI orbits all negative for any acute findings. -Evaluated by ophthalmology in ED-concern for temporal arteritis since sed rate was high at 78.  CRP: WNL.  Patient started on Solu-Medrol which we will continue -Appreciate neurology recommendation -Check Lyme, ANA, RF, CCP, HIV, RPR-all pending at this time -Consulted vascular surgery for temporal artery biopsy -Neuro check -Fall precautions  Uncontrolled type 2 diabetes with polyneuropathy: -On metformin, Januvia and 70/30 insulin at home.  A1c 8.8%.   -Continue sliding scale insulin and Semglee 15 units nightly -We closely monitor her blood sugar since she is on steroids  Cataract in left eye: Mild nonproliferative diabetic retinopathy Hypertensive retinopathy: -Follow-up with ophthalmology outpatient  AKI on CKD stage IIIb: -Creatinine slightly trended up this morning.  Continue to monitor renal function closely.  Hold losartan and hydrochlorothiazide.  Avoid nephrotoxic medications.  Diabetic polyneuropathy: Continue gabapentin  Hypertension:  -Continue amlodipine  and labetalol as needed.  Hold losartan and hydrochlorothiazide due to renal function.  Resume once kidney function is at baseline.  Hyperlipidemia: Continue statin  Leukocytosis: Likely secondary to steroids.  Continue to monitor.  She is afebrile.  Anemia of chronic disease: H&H is stable.  Continue iron supplements.  Continue to monitor  Chronic back pain: Continue Flexeril as needed  Obesity with BMI of 30: Diet modification, exercise and weight loss recommended  History of systolic murmur and first-degree AV block: -Patient is asymptomatic.  Follow-up outpatient.  DVT prophylaxis: SCD, heparin Code Status: Full code Family Communication: Patient's daughter present at bedside.  Plan of care discussed with patient in length and she verbalized understanding and agreed with it. Disposition Plan: To be determined  Consultants:  Neurology Ophthalmology Vascular surgery  Procedures:  None  Antimicrobials:  None  Status is: Inpatient    Subjective: Patient seen and examined.  Daughter at the bedside.  Patient reports improvement in her headache and right eye vision.  No fever, chills.  Her blood sugar noted to be elevated this morning.  No acute events overnight.  Objective: Vitals:   07/18/21 0839 07/18/21 1718 07/18/21 2132 07/19/21 0738  BP: (!) 145/72 (!) 147/70 (!) 143/71 (!) 148/68  Pulse: 87 100 95 83  Resp: 17 18 18 17   Temp: 98 F (36.7 C) 98.8 F (37.1 C) 98.9 F (37.2 C) 97.9 F (36.6 C)  TempSrc: Oral Oral Oral Oral  SpO2: 96% 99% 99% 97%  Weight:      Height:       No intake or output data in the 24 hours ending 07/19/21 0754  Filed Weights   07/17/21 1534  Weight: 77.6 kg    Examination:  General exam: Appears calm and comfortable,  on room air, obese, daughter at the bedside. Cataract noted in left eye, ptosis and right eye Respiratory system: Clear to auscultation. Respiratory effort normal. Cardiovascular system: S1 & S2 heard, RRR. No  JVD, systolic murmur, no pedal edema.   Gastrointestinal system: Abdomen is nondistended, soft and nontender. No organomegaly or masses felt. Normal bowel sounds heard. Central nervous system: Alert and oriented.  Power 5 out of 5 in all 4 extremities, sensation intact.   Extremities: Moving all extremities equally.   Skin: No rashes, lesions or ulcers   Data Reviewed: I have personally reviewed following labs and imaging studies  CBC: Recent Labs  Lab 07/17/21 1732 07/18/21 0322  WBC 12.4* 13.2*  NEUTROABS 8.2* 11.8*  HGB 10.4* 9.6*  HCT 31.6* 30.0*  MCV 83.2 83.3  PLT 285 [id number]    Basic Metabolic Panel: Recent Labs  Lab 07/17/21 1732 07/18/21 0322  NA 134* 136  K 4.8 4.9  CL 104 105  CO2 24 21*  GLUCOSE 221* 216*  BUN 29* 27*  CREATININE 1.29* 1.38*  CALCIUM 9.4 9.6  MG  --  1.9    GFR: Estimated Creatinine Clearance: 42.2 mL/min (A) (by C-G formula based on SCr of 1.38 mg/dL (H)). Liver Function Tests: Recent Labs  Lab 07/18/21 0322  AST 21  ALT 20  ALKPHOS 116  BILITOT 0.4  PROT 7.9  ALBUMIN 3.6    No results for input(s): LIPASE, AMYLASE in the last 168 hours. No results for input(s): AMMONIA in the last 168 hours. Coagulation Profile: No results for input(s): INR, PROTIME in the last 168 hours. Cardiac Enzymes: No results for input(s): CKTOTAL, CKMB, CKMBINDEX, TROPONINI in the last 168 hours. BNP (last 3 results) No results for input(s): PROBNP in the last 8760 hours. HbA1C: Recent Labs    07/18/21 0322  HGBA1C 8.8*    CBG: Recent Labs  Lab 07/18/21 0840 07/18/21 1301 07/18/21 1720 07/18/21 2129 07/19/21 0738  GLUCAP 278* 284* 305* 291* 401*    Lipid Profile: Recent Labs    07/18/21 0322  CHOL 152  HDL 27*  LDLCALC 94  TRIG 156*  CHOLHDL 5.6    Thyroid Function Tests: Recent Labs    07/18/21 0322  TSH 1.502    Anemia Panel: No results for input(s): VITAMINB12, FOLATE, FERRITIN, TIBC, IRON, RETICCTPCT in the last 72  hours. Sepsis Labs: No results for input(s): PROCALCITON, LATICACIDVEN in the last 168 hours.  No results found for this or any previous visit (from the past 240 hour(s)).    Radiology Studies: MR BRAIN W WO CONTRAST  Result Date: 07/18/2021 CLINICAL DATA:  Monocular vision loss EXAM: MRI HEAD AND ORBITS WITHOUT AND WITH CONTRAST TECHNIQUE: Multiplanar, multiecho pulse sequences of the brain and surrounding structures were obtained without and with intravenous contrast. Multiplanar, multiecho pulse sequences of the orbits and surrounding structures were obtained including fat saturation techniques, before and after intravenous contrast administration. CONTRAST:  67mL GADAVIST GADOBUTROL 1 MMOL/ML IV SOLN COMPARISON:  CT of the orbits from yesterday FINDINGS: MRI HEAD FINDINGS Brain: No acute infarction, hemorrhage, hydrocephalus, extra-axial collection or mass lesion. Indistinct FLAIR hyperintensity in the pons, usually from chronic microvascular ischemia. Chronic lacune in the upper right brainstem. Age congruent brain volume. Vascular: Normal flow voids. Skull and upper cervical spine: Normal marrow signal. MRI ORBITS FINDINGS Orbits: Right cataract resection. Otherwise symmetric orbits with normal appearance of the optic nerve sheath complexes, lacrimal glands, extraocular muscles, and orbital fat. Unremarkable chiasm and suprasellar cistern. Visualized sinuses:  Clear Soft tissues: Negative IMPRESSION: 1. No acute finding or specific cause for symptoms. 2. 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 Electronically Signed   By: Jorje Guild M.D.   On: 07/18/2021 12:21   CT Orbits W Contrast  Result Date: 07/17/2021 CLINICAL DATA:  Orbital cellulitis EXAM: CT ORBITS WITH CONTRAST TECHNIQUE: Multidetector CT images was performed according to the standard protocol following intravenous contrast administration. RADIATION DOSE REDUCTION: This exam was performed  according to the departmental dose-optimization program which includes automated exposure control, adjustment of the mA and/or kV according to patient size and/or use of iterative reconstruction technique. CONTRAST:  97mL OMNIPAQUE IOHEXOL 300 MG/ML  SOLN COMPARISON:  None Available. FINDINGS: Orbits: No orbital mass or evidence of inflammation. Normal appearance of the globes, optic nerve-sheath complexes, extraocular muscles, orbital fat and lacrimal glands. Visible paranasal sinuses: Clear. Soft tissues: Normal. Osseous: No fracture or aggressive lesion. Limited intracranial: No acute or significant finding. IMPRESSION: Unremarkable CT of the orbits. No visible edema or fluid collection. Electronically Signed   By: Margaretha Sheffield M.D.   On: 07/17/2021 18:50   MR ORBITS W WO CONTRAST  Result Date: 07/18/2021 CLINICAL DATA:  Monocular vision loss EXAM: MRI HEAD AND ORBITS WITHOUT AND WITH CONTRAST TECHNIQUE: Multiplanar, multiecho pulse sequences of the brain and surrounding structures were obtained without and with intravenous contrast. Multiplanar, multiecho pulse sequences of the orbits and surrounding structures were obtained including fat saturation techniques, before and after intravenous contrast administration. CONTRAST:  11mL GADAVIST GADOBUTROL 1 MMOL/ML IV SOLN COMPARISON:  CT of the orbits from yesterday FINDINGS: MRI HEAD FINDINGS Brain: No acute infarction, hemorrhage, hydrocephalus, extra-axial collection or mass lesion. Indistinct FLAIR hyperintensity in the pons, usually from chronic microvascular ischemia. Chronic lacune in the upper right brainstem. Age congruent brain volume. Vascular: Normal flow voids. Skull and upper cervical spine: Normal marrow signal. MRI ORBITS FINDINGS Orbits: Right cataract resection. Otherwise symmetric orbits with normal appearance of the optic nerve sheath complexes, lacrimal glands, extraocular muscles, and orbital fat. Unremarkable chiasm and suprasellar  cistern. Visualized sinuses: Clear Soft tissues: Negative IMPRESSION: 1. No acute finding or specific cause for symptoms. 2. 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 Electronically Signed   By: Jorje Guild M.D.   On: 07/18/2021 12:21    Scheduled Meds:  amLODipine  10 mg Oral Daily   amLODipine  5 mg Oral QHS   aspirin  81 mg Oral Daily   atorvastatin  40 mg Oral QHS   ferrous sulfate  650 mg Oral Q breakfast   gabapentin  100-300 mg Oral QHS   heparin  5,000 Units Subcutaneous Q8H   hydrochlorothiazide  25 mg Oral Daily   insulin aspart  0-15 Units Subcutaneous TID WC   insulin aspart  0-5 Units Subcutaneous QHS   insulin glargine-yfgn  10 Units Subcutaneous QHS   losartan  50 mg Oral Daily   Continuous Infusions:  sodium chloride Stopped (07/17/21 2339)   methylPREDNISolone (SOLU-MEDROL) injection 250 mg (07/19/21 0201)     LOS: 1 day   Time spent: 35 minutes   Alania Overholt Loann Quill, MD [facility]  If 7PM-7AM, please contact night-coverage [email or web address] 07/19/2021, 7:54 AM

## 2021-07-19 NOTE — Progress Notes (Signed)
Inpatient Diabetes Program Recommendations  AACE/ADA: New Consensus Statement on Inpatient Glycemic Control (2015)  Target Ranges:  Prepandial:   less than 140 mg/dL      Peak postprandial:   less than 180 mg/dL (1-2 hours)      Critically ill patients:  140 - 180 mg/dL   Lab Results  Component Value Date   GLUCAP 363 (H) 07/19/2021   HGBA1C 8.8 (H) 07/18/2021    Review of Glycemic Control  Latest Reference Range & Units 07/18/21 08:40 07/18/21 13:01 07/18/21 17:20 07/18/21 21:29 07/19/21 07:38 07/19/21 11:53  Glucose-Capillary 70 - 99 mg/dL 017 (H) 494 (H) 496 (H) 291 (H) 401 (H) 363 (H)   Diabetes history: DM 2 Outpatient Diabetes medications: 70/30 60 units qam, 40 units qpm, Januvia 100 mg Daily, Metformin 500 mg bid Current orders for Inpatient glycemic control:  Semglee 15 units qhs Novolog 0-15 units tid + hs  Solumedrol 250 mg Q8 hours  Inpatient Diabetes Program Recommendations:    -  Increase Semglee to 20 units -  Add Novolog 4 units tid meal coverage if eating >50% of meals  Thanks,  Christena Deem RN, MSN, BC-ADM Inpatient Diabetes Coordinator Team Pager [phone] (8a-5p)

## 2021-07-19 NOTE — Plan of Care (Signed)
Some improvement in vision. Continues to complain of headache. Pending temporal artery biopsy. Will follow once done. Continue steroids for now.  -- Milon Dikes, MD Neurologist [facility] Pager: [phone]

## 2021-07-19 NOTE — Consult Note (Signed)
Hospital Consult    Reason for Consult: Concern for temporal arteritis on the right Referring Physician: Dr. Pahwani MRN #:  [id number]  History of Present Illness: This is a 61 y.o. female without significant history of vascular disease with concern for temporal arteritis.  She has begun on Solu-Medrol for headache with right-sided visual loss and this has improved since admission.  She states that she still has tenderness to the right side of her head.  History obtained from patient's daughter at the bedside who served as interpreter.  Past Medical History:  Diagnosis Date   Coronary artery disease    Diabetes mellitus without complication (HCC)    Hyperlipidemia    Hypertension     Past Surgical History:  Procedure Laterality Date   cataract Right     Allergies  Allergen Reactions   Penicillins Anaphylaxis    Prior to Admission medications   Medication Sig Start Date End Date Taking? Authorizing Provider  amLODipine (NORVASC) 5 MG tablet Take 5 mg by mouth at bedtime. 07/03/21  Yes [provider]  aspirin 81 MG chewable tablet Chew 81 mg by mouth daily. 07/06/18  Yes [provider]  atorvastatin (LIPITOR) 40 MG tablet Take 40 mg by mouth at bedtime. 06/01/21  Yes [provider]  benzonatate (TESSALON) 100 MG capsule Take 100 mg by mouth 2 (two) times daily as needed for cough. 07/12/21  Yes [provider]  cyclobenzaprine (FLEXERIL) 5 MG tablet Take 10 mg by mouth 2 (two) times daily as needed for muscle spasms. 07/06/21  Yes [provider]  ferrous sulfate 325 (65 FE) MG tablet Take 650 mg by mouth daily with breakfast.   Yes [provider]  gabapentin (NEURONTIN) 100 MG capsule Take 100-300 mg by mouth at bedtime. 07/06/21  Yes [provider]  hydrochlorothiazide (HYDRODIURIL) 25 MG tablet Take 25 mg by mouth daily. 02/01/21  Yes [provider]  insulin NPH-regular Human (HUMULIN 70/30) (70-30) 100  UNIT/ML injection Inject 40-60 Units into the skin See admin instructions. Injecting 60 units in the AM and 40 units at bedtime. 10/20/17  Yes [provider]  JANUVIA 100 MG tablet Take 100 mg by mouth daily. 07/05/21  Yes [provider]  metFORMIN (GLUCOPHAGE) 500 MG tablet Take 500 mg by mouth 2 (two) times daily. 06/04/21  Yes [provider]  Multiple Vitamins-Minerals (MULTI FOR HER 50+ PO) Take 1 tablet by mouth daily.   Yes [provider]  naproxen sodium (ALEVE) 220 MG tablet Take 220 mg by mouth daily as needed (pain).   Yes [provider]  losartan (COZAAR) 50 MG tablet Take 50 mg by mouth daily. 04/27/21   [provider]    Social History   Socioeconomic History   Marital status: Widowed    Spouse name: Not on file   Number of children: Not on file   Years of education: Not on file   Highest education level: Not on file  Occupational History   Not on file  Tobacco Use   Smoking status: Never   Smokeless tobacco: Not on file  Vaping Use   Vaping Use: Never used  Substance and Sexual Activity   Alcohol use: Never   Drug use: Never   Sexual activity: Not on file  Other Topics Concern   Not on file  Social History Narrative   Not on file   Social Determinants of Health   Financial Resource Strain: Not on file  Food Insecurity:   Not on file  Transportation Needs: Not on file  Physical Activity: Not on file  Stress: Not on file  Social Connections: Not on file  Intimate Partner Violence: Not on file     History reviewed. No pertinent family history.  Review of Systems  Constitutional: Negative.   HENT: Negative.    Eyes:  Positive for blurred vision.  Respiratory: Negative.    Cardiovascular: Negative.   Musculoskeletal: Negative.   Skin: Negative.   Neurological:  Positive for headaches.  Endo/Heme/Allergies: Negative.   Psychiatric/Behavioral: Negative.       Physical Examination  Vitals:    07/18/21 2132 07/19/21 0738  BP: (!) 143/71 (!) 148/68  Pulse: 95 83  Resp: 18 17  Temp: 98.9 F (37.2 C) 97.9 F (36.6 C)  SpO2: 99% 97%   Body mass index is 30.29 kg/m.  Physical Exam HENT:     Head:     Comments: Tenderness to right side of head overlying the palpable temporal artery Eyes:     Pupils: Pupils are equal, round, and reactive to light.  Cardiovascular:     Pulses:          Radial pulses are 2+ on the right side and 2+ on the left side.  Pulmonary:     Effort: Pulmonary effort is normal.  Abdominal:     General: Abdomen is flat.     Palpations: Abdomen is soft.  Musculoskeletal:        General: Normal range of motion.     Right lower leg: No edema.     Left lower leg: No edema.  Skin:    General: Skin is dry.     Capillary Refill: Capillary refill takes less than 2 seconds.  Neurological:     General: No focal deficit present.     Mental Status: She is alert.     CBC    Component Value Date/Time   WBC 14.2 (H) 07/19/2021 0834   RBC 3.57 (L) 07/19/2021 0834   HGB 9.6 (L) 07/19/2021 0834   HCT 29.5 (L) 07/19/2021 0834   PLT 269 07/19/2021 0834   MCV 82.6 07/19/2021 0834   MCH 26.9 07/19/2021 0834   MCHC 32.5 07/19/2021 0834   RDW 12.5 07/19/2021 0834   LYMPHSABS 1.0 07/18/2021 0322   MONOABS 0.1 07/18/2021 0322   EOSABS 0.0 07/18/2021 0322   BASOSABS 0.0 07/18/2021 0322    BMET    Component Value Date/Time   NA 136 07/19/2021 0834   K 4.4 07/19/2021 0834   CL 103 07/19/2021 0834   CO2 22 07/19/2021 0834   GLUCOSE 399 (H) 07/19/2021 0834   BUN 41 (H) 07/19/2021 0834   CREATININE 1.53 (H) 07/19/2021 0834   CALCIUM 10.1 07/19/2021 0834   GFRNONAA 38 (L) 07/19/2021 0834    CRP: 0.9 ESR: 78  Non-Invasive Vascular Imaging:   CT IMPRESSION: Unremarkable CT of the orbits. No visible edema or fluid collection.      ASSESSMENT/PLAN: This is a 61 y.o. female here with right-sided headaches and visual changes improved with steroids.   She does have markedly elevated ESR with concern for temporal arteritis.  Plan will be for right-sided temporal artery biopsy tomorrow in the OR.  I discussed the risk benefits and alternatives as well as the nontherapeutic nature of this procedure.  She demonstrates good understanding via interpretation by her daughter.   Olubunmi Rothenberger C. Rula Keniston, MD [facility] Office: [phone] Pager: [phone]  

## 2021-07-19 NOTE — H&P (View-Only) (Signed)
Hospital Consult    Reason for Consult: Concern for temporal arteritis on the right Referring Physician: Dr. Doristine Bosworth MRN #:  [id number]  History of Present Illness: This is a 61 y.o. female without significant history of vascular disease with concern for temporal arteritis.  She has begun on Solu-Medrol for headache with right-sided visual loss and this has improved since admission.  She states that she still has tenderness to the right side of her head.  History obtained from patient's daughter at the bedside who served as interpreter.  Past Medical History:  Diagnosis Date   Coronary artery disease    Diabetes mellitus without complication ([place])    Hyperlipidemia    Hypertension     Past Surgical History:  Procedure Laterality Date   cataract Right     Allergies  Allergen Reactions   Penicillins Anaphylaxis    Prior to Admission medications   Medication Sig Start Date End Date Taking? Authorizing Provider  amLODipine (NORVASC) 5 MG tablet Take 5 mg by mouth at bedtime. 07/03/21  Yes [provider]  aspirin 81 MG chewable tablet Chew 81 mg by mouth daily. 07/06/18  Yes [provider]  atorvastatin (LIPITOR) 40 MG tablet Take 40 mg by mouth at bedtime. 06/01/21  Yes [provider]  benzonatate (TESSALON) 100 MG capsule Take 100 mg by mouth 2 (two) times daily as needed for cough. 07/12/21  Yes [provider]  cyclobenzaprine (FLEXERIL) 5 MG tablet Take 10 mg by mouth 2 (two) times daily as needed for muscle spasms. 07/06/21  Yes [provider]  ferrous sulfate 325 (65 FE) MG tablet Take 650 mg by mouth daily with breakfast.   Yes [provider]  gabapentin (NEURONTIN) 100 MG capsule Take 100-300 mg by mouth at bedtime. 07/06/21  Yes [provider]  hydrochlorothiazide (HYDRODIURIL) 25 MG tablet Take 25 mg by mouth daily. 02/01/21  Yes [provider]  insulin NPH-regular Human (HUMULIN 70/30) (70-30) 100  UNIT/ML injection Inject 40-60 Units into the skin See admin instructions. Injecting 60 units in the AM and 40 units at bedtime. 10/20/17  Yes [provider]  JANUVIA 100 MG tablet Take 100 mg by mouth daily. 07/05/21  Yes [provider]  metFORMIN (GLUCOPHAGE) 500 MG tablet Take 500 mg by mouth 2 (two) times daily. 06/04/21  Yes [provider]  Multiple Vitamins-Minerals (MULTI FOR HER 50+ PO) Take 1 tablet by mouth daily.   Yes [provider]  naproxen sodium (ALEVE) 220 MG tablet Take 220 mg by mouth daily as needed (pain).   Yes [provider]  losartan (COZAAR) 50 MG tablet Take 50 mg by mouth daily. 04/27/21   [provider]    Social History   Socioeconomic History   Marital status: Widowed    Spouse name: Not on file   Number of children: Not on file   Years of education: Not on file   Highest education level: Not on file  Occupational History   Not on file  Tobacco Use   Smoking status: Never   Smokeless tobacco: Not on file  Vaping Use   Vaping Use: Never used  Substance and Sexual Activity   Alcohol use: Never   Drug use: Never   Sexual activity: Not on file  Other Topics Concern   Not on file  Social History Narrative   Not on file   Social Determinants of Health   Financial Resource Strain: Not on file  Food Insecurity:  Not on file  Transportation Needs: Not on file  Physical Activity: Not on file  Stress: Not on file  Social Connections: Not on file  Intimate Partner Violence: Not on file     History reviewed. No pertinent family history.  Review of Systems  Constitutional: Negative.   HENT: Negative.    Eyes:  Positive for blurred vision.  Respiratory: Negative.    Cardiovascular: Negative.   Musculoskeletal: Negative.   Skin: Negative.   Neurological:  Positive for headaches.  Endo/Heme/Allergies: Negative.   Psychiatric/Behavioral: Negative.       Physical Examination  Vitals:    07/18/21 2132 07/19/21 0738  BP: (!) 143/71 (!) 148/68  Pulse: 95 83  Resp: 18 17  Temp: 98.9 F (37.2 C) 97.9 F (36.6 C)  SpO2: 99% 97%   Body mass index is 30.29 kg/m.  Physical Exam HENT:     Head:     Comments: Tenderness to right side of head overlying the palpable temporal artery Eyes:     Pupils: Pupils are equal, round, and reactive to light.  Cardiovascular:     Pulses:          Radial pulses are 2+ on the right side and 2+ on the left side.  Pulmonary:     Effort: Pulmonary effort is normal.  Abdominal:     General: Abdomen is flat.     Palpations: Abdomen is soft.  Musculoskeletal:        General: Normal range of motion.     Right lower leg: No edema.     Left lower leg: No edema.  Skin:    General: Skin is dry.     Capillary Refill: Capillary refill takes less than 2 seconds.  Neurological:     General: No focal deficit present.     Mental Status: She is alert.     CBC    Component Value Date/Time   WBC 14.2 (H) 07/19/2021 0834   RBC 3.57 (L) 07/19/2021 0834   HGB 9.6 (L) 07/19/2021 0834   HCT 29.5 (L) 07/19/2021 0834   PLT 269 07/19/2021 0834   MCV 82.6 07/19/2021 0834   MCH 26.9 07/19/2021 0834   MCHC 32.5 07/19/2021 0834   RDW 12.5 07/19/2021 0834   LYMPHSABS 1.0 07/18/2021 0322   MONOABS 0.1 07/18/2021 0322   EOSABS 0.0 07/18/2021 0322   BASOSABS 0.0 07/18/2021 0322    BMET    Component Value Date/Time   NA 136 07/19/2021 0834   K 4.4 07/19/2021 0834   CL 103 07/19/2021 0834   CO2 22 07/19/2021 0834   GLUCOSE 399 (H) 07/19/2021 0834   BUN 41 (H) 07/19/2021 0834   CREATININE 1.53 (H) 07/19/2021 0834   CALCIUM 10.1 07/19/2021 0834   GFRNONAA 38 (L) 07/19/2021 0834    CRP: 0.9 ESR: 78  Non-Invasive Vascular Imaging:   CT IMPRESSION: Unremarkable CT of the orbits. No visible edema or fluid collection.      ASSESSMENT/PLAN: This is a 61 y.o. female here with right-sided headaches and visual changes improved with steroids.   She does have markedly elevated ESR with concern for temporal arteritis.  Plan will be for right-sided temporal artery biopsy tomorrow in the OR.  I discussed the risk benefits and alternatives as well as the nontherapeutic nature of this procedure.  She demonstrates good understanding via interpretation by her daughter.   Gaynor Ferreras C. Donzetta Matters, MD [facility] Office: [phone] Pager: [phone]

## 2021-07-19 NOTE — Plan of Care (Signed)
  Problem: Nutrition: Goal: Adequate nutrition will be maintained Outcome: Progressing   Problem: Pain Managment: Goal: General experience of comfort will improve Outcome: Progressing   Problem: Safety: Goal: Ability to remain free from injury will improve Outcome: Progressing   

## 2021-07-20 ENCOUNTER — Inpatient Hospital Stay (HOSPITAL_COMMUNITY): Payer: Medicaid Other | Admitting: Anesthesiology

## 2021-07-20 ENCOUNTER — Other Ambulatory Visit: Payer: Self-pay

## 2021-07-20 ENCOUNTER — Encounter (HOSPITAL_COMMUNITY)
Admission: EM | Disposition: A | Discharge status: Home / Self Care | Payer: Self-pay | Source: Home / Self Care | Attending: Internal Medicine

## 2021-07-20 ENCOUNTER — Encounter (HOSPITAL_COMMUNITY): Payer: Self-pay | Admitting: Internal Medicine

## 2021-07-20 DIAGNOSIS — Z794 Long term (current) use of insulin: Secondary | ICD-10-CM

## 2021-07-20 DIAGNOSIS — N182 Chronic kidney disease, stage 2 (mild): Secondary | ICD-10-CM

## 2021-07-20 DIAGNOSIS — M898X8 Other specified disorders of bone, other site: Secondary | ICD-10-CM

## 2021-07-20 DIAGNOSIS — R519 Headache, unspecified: Secondary | ICD-10-CM | POA: Diagnosis not present

## 2021-07-20 DIAGNOSIS — E1122 Type 2 diabetes mellitus with diabetic chronic kidney disease: Secondary | ICD-10-CM

## 2021-07-20 DIAGNOSIS — Z7984 Long term (current) use of oral hypoglycemic drugs: Secondary | ICD-10-CM

## 2021-07-20 DIAGNOSIS — I129 Hypertensive chronic kidney disease with stage 1 through stage 4 chronic kidney disease, or unspecified chronic kidney disease: Secondary | ICD-10-CM

## 2021-07-20 DIAGNOSIS — H538 Other visual disturbances: Secondary | ICD-10-CM | POA: Diagnosis not present

## 2021-07-20 DIAGNOSIS — H539 Unspecified visual disturbance: Secondary | ICD-10-CM

## 2021-07-20 HISTORY — PX: ARTERY BIOPSY: SHX891

## 2021-07-20 LAB — RHEUMATOID FACTOR: Rheumatoid fact SerPl-aCnc: 14.7 IU/mL — ABNORMAL HIGH (ref <14.0)

## 2021-07-20 LAB — CBC WITH DIFFERENTIAL/PLATELET
Abs Immature Granulocytes: 0.15 10*3/uL — ABNORMAL HIGH (ref 0.00–0.07)
Basophils Absolute: 0 10*3/uL (ref 0.0–0.1)
Basophils Relative: 0 %
Eosinophils Absolute: 0 10*3/uL (ref 0.0–0.5)
Eosinophils Relative: 0 %
HCT: 28.1 % — ABNORMAL LOW (ref 36.0–46.0)
Hemoglobin: 9.2 g/dL — ABNORMAL LOW (ref 12.0–15.0)
Immature Granulocytes: 1 %
Lymphocytes Relative: 10 %
Lymphs Abs: 1.3 10*3/uL (ref 0.7–4.0)
MCH: 26.9 pg (ref 26.0–34.0)
MCHC: 32.7 g/dL (ref 30.0–36.0)
MCV: 82.2 fL (ref 80.0–100.0)
Monocytes Absolute: 0.5 10*3/uL (ref 0.1–1.0)
Monocytes Relative: 4 %
Neutro Abs: 11 10*3/uL — ABNORMAL HIGH (ref 1.7–7.7)
Neutrophils Relative %: 85 %
Platelets: 252 10*3/uL (ref 150–400)
RBC: 3.42 MIL/uL — ABNORMAL LOW (ref 3.87–5.11)
RDW: 12.8 % (ref 11.5–15.5)
WBC: 13 10*3/uL — ABNORMAL HIGH (ref 4.0–10.5)
nRBC: 0 % (ref 0.0–0.2)

## 2021-07-20 LAB — GLUCOSE, CAPILLARY
Glucose-Capillary: 287 mg/dL — ABNORMAL HIGH (ref 70–99)
Glucose-Capillary: 290 mg/dL — ABNORMAL HIGH (ref 70–99)
Glucose-Capillary: 292 mg/dL — ABNORMAL HIGH (ref 70–99)
Glucose-Capillary: 298 mg/dL — ABNORMAL HIGH (ref 70–99)
Glucose-Capillary: 333 mg/dL — ABNORMAL HIGH (ref 70–99)
Glucose-Capillary: 434 mg/dL — ABNORMAL HIGH (ref 70–99)

## 2021-07-20 LAB — BASIC METABOLIC PANEL
Anion gap: 11 (ref 5–15)
BUN: 50 mg/dL — ABNORMAL HIGH (ref 8–23)
CO2: 23 mmol/L (ref 22–32)
Calcium: 9.5 mg/dL (ref 8.9–10.3)
Chloride: 100 mmol/L (ref 98–111)
Creatinine, Ser: 1.71 mg/dL — ABNORMAL HIGH (ref 0.44–1.00)
GFR, Estimated: 34 mL/min — ABNORMAL LOW (ref >60)
Glucose, Bld: 325 mg/dL — ABNORMAL HIGH (ref 70–99)
Potassium: 4 mmol/L (ref 3.5–5.1)
Sodium: 134 mmol/L — ABNORMAL LOW (ref 135–145)

## 2021-07-20 LAB — SJOGRENS SYNDROME-B EXTRACTABLE NUCLEAR ANTIBODY: SSB (La) (ENA) Antibody, IgG: <0.2 AI (ref 0.0–0.9)

## 2021-07-20 LAB — LYME DISEASE SEROLOGY W/REFLEX: Lyme Total Antibody EIA: NEGATIVE

## 2021-07-20 LAB — SJOGRENS SYNDROME-A EXTRACTABLE NUCLEAR ANTIBODY: SSA (Ro) (ENA) Antibody, IgG: <0.2 AI (ref 0.0–0.9)

## 2021-07-20 LAB — CYCLIC CITRUL PEPTIDE ANTIBODY, IGG/IGA: CCP Antibodies IgG/IgA: 6 units (ref 0–19)

## 2021-07-20 LAB — ANA W/REFLEX IF POSITIVE: Anti Nuclear Antibody (ANA): NEGATIVE

## 2021-07-20 SURGERY — BIOPSY TEMPORAL ARTERY
Anesthesia: Monitor Anesthesia Care | Site: Head | Laterality: Right

## 2021-07-20 MED ORDER — MIDAZOLAM HCL 2 MG/2ML IJ SOLN
INTRAMUSCULAR | Status: AC
  Filled 2021-07-20: qty 2

## 2021-07-20 MED ORDER — CEFAZOLIN SODIUM-DEXTROSE 2-4 GM/100ML-% IV SOLN
INTRAVENOUS | Status: AC
  Filled 2021-07-20: qty 100

## 2021-07-20 MED ORDER — LIDOCAINE HCL (PF) 1 % IJ SOLN
INTRAMUSCULAR | Status: AC
Start: 2021-07-20 — End: ?
  Filled 2021-07-20: qty 30

## 2021-07-20 MED ORDER — INSULIN ASPART 100 UNIT/ML IJ SOLN
4.0000 [IU] | Freq: Once | INTRAMUSCULAR | Status: AC
  Administered 2021-07-20: 4 [IU] via SUBCUTANEOUS

## 2021-07-20 MED ORDER — FENTANYL CITRATE (PF) 250 MCG/5ML IJ SOLN
INTRAMUSCULAR | Status: DC | PRN
Start: 2021-07-20 — End: 2021-07-20
  Administered 2021-07-20: 25 ug via INTRAVENOUS

## 2021-07-20 MED ORDER — INSULIN ASPART 100 UNIT/ML IJ SOLN
8.0000 [IU] | Freq: Three times a day (TID) | INTRAMUSCULAR | Status: DC
  Administered 2021-07-20: 8 [IU] via SUBCUTANEOUS

## 2021-07-20 MED ORDER — VANCOMYCIN HCL 1000 MG IV SOLR: 1000.0000 mg | Freq: Once | INTRAVENOUS | Status: DC

## 2021-07-20 MED ORDER — INSULIN GLARGINE-YFGN 100 UNIT/ML ~~LOC~~ SOLN
20.0000 [IU] | Freq: Every day | SUBCUTANEOUS | Status: DC
  Administered 2021-07-20: 20 [IU] via SUBCUTANEOUS
  Filled 2021-07-20 (×3): qty 0.2

## 2021-07-20 MED ORDER — MIDAZOLAM HCL 2 MG/2ML IJ SOLN
INTRAMUSCULAR | Status: DC | PRN
  Administered 2021-07-20 (×2): 1 mg via INTRAVENOUS

## 2021-07-20 MED ORDER — PROPOFOL 500 MG/50ML IV EMUL
INTRAVENOUS | Status: DC | PRN
  Administered 2021-07-20: 125 ug/kg/min via INTRAVENOUS

## 2021-07-20 MED ORDER — CHLORHEXIDINE GLUCONATE 0.12 % MT SOLN
OROMUCOSAL | Status: AC
  Administered 2021-07-20: 15 mL
  Filled 2021-07-20: qty 15

## 2021-07-20 MED ORDER — PROPOFOL 1000 MG/100ML IV EMUL
INTRAVENOUS | Status: AC
  Filled 2021-07-20: qty 100

## 2021-07-20 MED ORDER — VANCOMYCIN HCL 1250 MG/250ML IV SOLN
1250.0000 mg | Freq: Once | INTRAVENOUS | Status: AC
  Administered 2021-07-20: 1250 mg via INTRAVENOUS
  Filled 2021-07-20: qty 250

## 2021-07-20 MED ORDER — STERILE WATER FOR IRRIGATION IR SOLN
Status: DC | PRN
  Administered 2021-07-20: 1000 mL

## 2021-07-20 MED ORDER — PHENYLEPHRINE 80 MCG/ML (10ML) SYRINGE FOR IV PUSH (FOR BLOOD PRESSURE SUPPORT)
PREFILLED_SYRINGE | INTRAVENOUS | Status: DC | PRN
  Administered 2021-07-20: 160 ug via INTRAVENOUS
  Administered 2021-07-20 (×2): 80 ug via INTRAVENOUS

## 2021-07-20 MED ORDER — OXYCODONE-ACETAMINOPHEN 5-325 MG PO TABS: 1.0000 | ORAL_TABLET | ORAL | Status: DC | PRN

## 2021-07-20 MED ORDER — INSULIN ASPART 100 UNIT/ML IJ SOLN
15.0000 [IU] | Freq: Once | INTRAMUSCULAR | Status: AC
  Administered 2021-07-20: 15 [IU] via SUBCUTANEOUS

## 2021-07-20 MED ORDER — LACTATED RINGERS IV SOLN: INTRAVENOUS | Status: DC

## 2021-07-20 MED ORDER — FENTANYL CITRATE (PF) 250 MCG/5ML IJ SOLN
INTRAMUSCULAR | Status: AC
  Filled 2021-07-20: qty 5

## 2021-07-20 MED ORDER — ORAL CARE MOUTH RINSE: 15.0000 mL | Freq: Once | OROMUCOSAL | Status: DC

## 2021-07-20 MED ORDER — CHLORHEXIDINE GLUCONATE 0.12 % MT SOLN: 15.0000 mL | Freq: Once | OROMUCOSAL | Status: DC

## 2021-07-20 MED ORDER — LIDOCAINE HCL (PF) 1 % IJ SOLN
INTRAMUSCULAR | Status: DC | PRN
  Administered 2021-07-20: 2 mL

## 2021-07-20 MED ORDER — INSULIN ASPART 100 UNIT/ML IJ SOLN
6.0000 [IU] | Freq: Three times a day (TID) | INTRAMUSCULAR | Status: DC
Start: 2021-07-20 — End: 2021-07-20

## 2021-07-20 MED ORDER — SODIUM CHLORIDE 0.9 % IR SOLN
Status: DC | PRN
  Administered 2021-07-20: 1

## 2021-07-20 SURGICAL SUPPLY — 43 items
ADH SKN CLS APL DERMABOND .7 (GAUZE/BANDAGES/DRESSINGS) ×1
BAG COUNTER SPONGE SURGICOUNT (BAG) ×2 IMPLANT
BAG SPNG CNTER NS LX DISP (BAG) ×1
BALL CTTN LRG ABS STRL LF (GAUZE/BANDAGES/DRESSINGS) ×1
CANISTER SUCT 3000ML PPV (MISCELLANEOUS) ×2 IMPLANT
CLIP TI WIDE RED SMALL 6 (CLIP) ×1 IMPLANT
CNTNR URN SCR LID CUP LEK RST (MISCELLANEOUS) ×1 IMPLANT
CONT SPEC 4OZ STRL OR WHT (MISCELLANEOUS) ×2
COTTONBALL LRG STERILE PKG (GAUZE/BANDAGES/DRESSINGS) ×2 IMPLANT
COVER PROBE W GEL 5X96 (DRAPES) ×1 IMPLANT
COVER SURGICAL LIGHT HANDLE (MISCELLANEOUS) ×3 IMPLANT
DECANTER SPIKE VIAL GLASS SM (MISCELLANEOUS) ×2 IMPLANT
DERMABOND ADVANCED (GAUZE/BANDAGES/DRESSINGS) ×1
DERMABOND ADVANCED .7 DNX12 (GAUZE/BANDAGES/DRESSINGS) ×1 IMPLANT
DRAPE OPHTHALMIC 77X100 STRL (CUSTOM PROCEDURE TRAY) ×2 IMPLANT
ELECT NDL TIP 2.8 STRL (NEEDLE) ×1 IMPLANT
ELECT NEEDLE TIP 2.8 STRL (NEEDLE) ×2 IMPLANT
ELECT REM PT RETURN 9FT ADLT (ELECTROSURGICAL) ×2
ELECTRODE REM PT RTRN 9FT ADLT (ELECTROSURGICAL) ×1 IMPLANT
GAUZE 4X4 16PLY ~~LOC~~+RFID DBL (SPONGE) ×1 IMPLANT
GAUZE SPONGE 2X2 8PLY STRL LF (GAUZE/BANDAGES/DRESSINGS) ×1 IMPLANT
GLOVE BIO SURGEON STRL SZ7.5 (GLOVE) ×2 IMPLANT
GLOVE BIOGEL PI IND STRL 8 (GLOVE) ×1 IMPLANT
GLOVE BIOGEL PI INDICATOR 8 (GLOVE) ×1
GLOVE SURG UNDER LTX SZ8 (GLOVE) ×2 IMPLANT
GOWN STRL REUS W/ TWL LRG LVL3 (GOWN DISPOSABLE) ×3 IMPLANT
GOWN STRL REUS W/TWL LRG LVL3 (GOWN DISPOSABLE) ×6
KIT BASIN OR (CUSTOM PROCEDURE TRAY) ×2 IMPLANT
KIT TURNOVER KIT B (KITS) ×2 IMPLANT
NDL HYPO 25GX1X1/2 BEV (NEEDLE) ×1 IMPLANT
NEEDLE HYPO 25GX1X1/2 BEV (NEEDLE) ×2 IMPLANT
NS IRRIG 1000ML POUR BTL (IV SOLUTION) ×2 IMPLANT
PACK GENERAL/GYN (CUSTOM PROCEDURE TRAY) ×2 IMPLANT
PAD ARMBOARD 7.5X6 YLW CONV (MISCELLANEOUS) ×4 IMPLANT
SPONGE GAUZE 2X2 STER 10/PKG (GAUZE/BANDAGES/DRESSINGS) ×1
SUT MNCRL AB 4-0 PS2 18 (SUTURE) ×2 IMPLANT
SUT SILK 4 0 (SUTURE) ×2
SUT SILK 4-0 18XBRD TIE 12 (SUTURE) IMPLANT
SUT VIC AB 3-0 SH 27 (SUTURE) ×2
SUT VIC AB 3-0 SH 27X BRD (SUTURE) ×1 IMPLANT
SYR CONTROL 10ML LL (SYRINGE) ×2 IMPLANT
TOWEL GREEN STERILE (TOWEL DISPOSABLE) ×2 IMPLANT
WATER STERILE IRR 1000ML POUR (IV SOLUTION) ×2 IMPLANT

## 2021-07-20 NOTE — Plan of Care (Signed)
  Problem: Nutrition: Goal: Adequate nutrition will be maintained Outcome: Progressing   Problem: Pain Managment: Goal: General experience of comfort will improve Outcome: Progressing   Problem: Safety: Goal: Ability to remain free from injury will improve Outcome: Progressing   

## 2021-07-20 NOTE — Progress Notes (Signed)
Date and time results received: 07/19/21 20:54 Test: CBG Critical Value: 414  Name of Provider Notified: Nelia Shi  Orders Received? Or Actions Taken?:  NP advised to give 5units Novolog coverage and give scheduled Levemir 15 units as well and recheck CBG at MN. Rechecked CBG it was 333.  NP made aware, ordered additional 4units Novolog-administered.  Will continue to close monitor patient. RP daughter at bedside

## 2021-07-20 NOTE — Progress Notes (Signed)
PROGRESS NOTE    Chelsea English  ZOX:[id number] DOB: 09/22/1960 DOA: 07/17/2021 PCP: Freddrick March, PA-C   Brief Narrative:  61 year old Arabic female with a history of type 2 diabetes, hypertension, CKD stage II presents to the ER on 07/17/2021 with a 3-day history of right-sided eye pain, headache and vision changes.   Upon arrival to ED: Patient was afebrile, pulse 86, respiratory rate 18, blood pressure 168/73, maintaining oxygen saturation on room air.  Evaluated by ophthalmology and she had dilated right eye exam.  Ophthalmology felt pins patient's symptoms concerning for temporal arteritis.  CT orbits: Resulted negative.  Solu-Medrol was started and patient transferred to [facility] for further evaluation and management.   Assessment & Plan:   Headache with right-sided vision loss: -Could be due to temporal arteritis? -CT orbits, MRI brain and MRI orbits all negative for any acute findings. -Evaluated by ophthalmology in ED-concern for temporal arteritis since sed rate was high at 78.  CRP: WNL.  Patient started on Solu-Medrol which we will continue -Appreciate neurology recommendation -Check Lyme, ANA, RF, CCP, HIV, RPR-all pending at this time -Consulted vascular surgery for temporal artery biopsy -Neuro check -Fall precautions  Steroid-induced hyperglycemia Uncontrolled type 2 diabetes with polyneuropathy: -On metformin, Januvia and 70/30 insulin at home.  A1c 8.8%.   -Continue sliding scale insulin  -Added 6 units Novolog TID WC -Increase Semglee 15 >> 20units nightly -We closely monitor her blood sugar since she is on steroids  Cataract in left eye: Mild nonproliferative diabetic retinopathy Hypertensive retinopathy: -Follow-up with ophthalmology outpatient  AKI on CKD stage IIIb: -Creatinine slightly trended up past couple of days.   Continue to monitor renal function closely.   Hold losartan and hydrochlorothiazide.   Avoid nephrotoxic  medications. Encourage PO hydration.  Diabetic polyneuropathy: Continue gabapentin  Hypertension:  -Continue amlodipine and labetalol as needed.  Hold losartan and hydrochlorothiazide due to renal function.  Resume once kidney function is at baseline.  Hyperlipidemia: Continue statin  Leukocytosis: Likely secondary to steroids.  Continue to monitor.  She is afebrile.  Anemia of chronic disease: H&H is stable.  Continue iron supplements.  Continue to monitor  Chronic back pain: Continue Flexeril as needed  Obesity with BMI of 30: Diet modification, exercise and weight loss recommended  History of systolic murmur and first-degree AV block: -Patient is asymptomatic.  Follow-up outpatient.  DVT prophylaxis: SCD, heparin Code Status: Full code Family Communication: Patient's daughter present at bedside.  Plan of care discussed with patient in length and she verbalized understanding and agreed with it. Disposition Plan: To be determined  Consultants:  Neurology Ophthalmology Vascular surgery  Procedures:  None  Antimicrobials:  None  Status is: Inpatient    Subjective: Patient seen and examined.  Daughter at the bedside.  She reports no changes in her vision from R eye.  Continues to have headache although not as bad.  We discussed elevated blood sugars due to high dose IV steroids, adjusting insulin.  She and daughter express understanding that pt may require higher insulin doses for awhile if steroids need to be continued longer term.  Objective: Vitals:   07/20/21 1230 07/20/21 1240 07/20/21 1304 07/20/21 1558  BP: 127/76 (!) 141/67 129/71 (!) 149/67  Pulse: 82 74 79 78  Resp: Temp:  98 F (36.7 C) 97.7 F (36.5 C) 98.1 F (36.7 C)  TempSrc:   Oral Oral  SpO2: 95% 97% 100% 98%  Weight:      Height:  Intake/Output Summary (Last 24 hours) at 07/20/2021 1721 Last data filed at 07/20/2021 1514 Gross per 24 hour  Intake 1276 ml  Output 10 ml   Net 1266 ml   Filed Weights   07/17/21 1534 07/20/21 1037  Weight: 77.6 kg 77.6 kg    Examination:  General exam: awake, alert, no acute distress, obese HEENT: right temporal area incision site well approximated and intact, moist mucus membranes, hearing grossly normal  Respiratory system: CTAB, no wheezes, rales or rhonchi, normal respiratory effort. Cardiovascular system: normal S1/S2, RRR, no JVD, murmurs, rubs, gallops, no pedal edema.   Gastrointestinal system: soft, NT, ND, no HSM felt, +bowel sounds. Central nervous system: A&O x3. no gross focal neurologic deficits, normal speech Extremities: moves all, no edema, normal tone Skin: dry, intact, normal temperature Psychiatry: normal mood, congruent affect, judgement and insight appear normal    Data Reviewed: I have personally reviewed following labs and imaging studies  CBC: Recent Labs  Lab 07/17/21 1732 07/18/21 0322 07/19/21 0834 07/20/21 0211  WBC 12.4* 13.2* 14.2* 13.0*  NEUTROABS 8.2* 11.8*  --  11.0*  HGB 10.4* 9.6* 9.6* 9.2*  HCT 31.6* 30.0* 29.5* 28.1*  MCV 83.2 83.3 82.6 82.2  PLT 285 270 269 252   Basic Metabolic Panel: Recent Labs  Lab 07/17/21 1732 07/18/21 0322 07/19/21 0834 07/20/21 0211  NA 134* 136 136 134*  K 4.8 4.9 4.4 4.0  CL 104 105 103 100  CO2 24 21* 22 23  GLUCOSE 221* 216* 399* 325*  BUN 29* 27* 41* 50*  CREATININE 1.29* 1.38* 1.53* 1.71*  CALCIUM 9.4 9.6 10.1 9.5  MG  --  1.9  --   --    GFR: Estimated Creatinine Clearance: 34.1 mL/min (A) (by C-G formula based on SCr of 1.71 mg/dL (H)). Liver Function Tests: Recent Labs  Lab 07/18/21 0322  AST 21  ALT 20  ALKPHOS 116  BILITOT 0.4  PROT 7.9  ALBUMIN 3.6   No results for input(s): LIPASE, AMYLASE in the last 168 hours. No results for input(s): AMMONIA in the last 168 hours. Coagulation Profile: No results for input(s): INR, PROTIME in the last 168 hours. Cardiac Enzymes: No results for input(s): CKTOTAL, CKMB,  CKMBINDEX, TROPONINI in the last 168 hours. BNP (last 3 results) No results for input(s): PROBNP in the last 8760 hours. HbA1C: Recent Labs    07/18/21 0322  HGBA1C 8.8*   CBG: Recent Labs  Lab 07/20/21 0004 07/20/21 0810 07/20/21 1032 07/20/21 1218 07/20/21 1559  GLUCAP 333* 287* 298* 290* 292*   Lipid Profile: Recent Labs    07/18/21 0322  CHOL 152  HDL 27*  LDLCALC 94  TRIG 628*  CHOLHDL 5.6   Thyroid Function Tests: Recent Labs    07/18/21 0322  TSH 1.502   Anemia Panel: No results for input(s): VITAMINB12, FOLATE, FERRITIN, TIBC, IRON, RETICCTPCT in the last 72 hours. Sepsis Labs: No results for input(s): PROCALCITON, LATICACIDVEN in the last 168 hours.  No results found for this or any previous visit (from the past 240 hour(s)).    Radiology Studies: No results found.  Scheduled Meds:  amLODipine  10 mg Oral Daily   aspirin  81 mg Oral Daily   atorvastatin  40 mg Oral QHS   ferrous sulfate  650 mg Oral Q breakfast   gabapentin  100-300 mg Oral QHS   heparin  5,000 Units Subcutaneous Q8H   insulin aspart  0-15 Units Subcutaneous TID WC   insulin aspart  0-5 Units Subcutaneous QHS   insulin aspart  6 Units Subcutaneous TID WC   insulin glargine-yfgn  20 Units Subcutaneous QHS   melatonin  3 mg Oral QHS   polyethylene glycol  17 g Oral Daily   Continuous Infusions:  sodium chloride 10 mL/hr (07/20/21 1514)   ceFAZolin     methylPREDNISolone (SOLU-MEDROL) injection 250 mg (07/20/21 1517)     LOS: 2 days   Time spent: 35 minutes   Ezekiel Slocumb, DO [facility]  If 7PM-7AM, please contact night-coverage [email or web address] 07/20/2021, 5:21 PM

## 2021-07-20 NOTE — Plan of Care (Signed)
S/p temporal artery biopsy. Continue steroids and outpatient f/u with ophtho. F/U biopsy and duration of steroids per optho. Plan d/w Dr. Denton Lank.   -- Milon Dikes, MD Neurologist [facility] Pager: [phone]

## 2021-07-20 NOTE — Progress Notes (Signed)
Dr Marcene Duos informed of patient's CBG 298 in SS at 1032.  Dr Sampson Goon did not want to give any insulin in SS.  Verified no insulin to be given in SS on DOS.

## 2021-07-20 NOTE — Anesthesia Procedure Notes (Addendum)
Procedure Name: General with mask airway Date/Time: 07/20/2021 11:29 AM Performed by: Katina Degree, CRNA Pre-anesthesia Checklist: Patient identified, Emergency Drugs available, Suction available and Patient being monitored Patient Re-evaluated:Patient Re-evaluated prior to induction Oxygen Delivery Method: Simple face mask Preoxygenation: Pre-oxygenation with 100% oxygen Induction Type: IV induction Dental Injury: Teeth and Oropharynx as per pre-operative assessment

## 2021-07-20 NOTE — Anesthesia Preprocedure Evaluation (Signed)
Anesthesia Evaluation  Patient identified by MRN, date of birth, ID band Patient awake    Reviewed: Allergy & Precautions, NPO status , Patient's Chart, lab work & pertinent test results  Airway Mallampati: II  TM Distance: >3 FB Neck ROM: Full    Dental  (+) Dental Advisory Given   Pulmonary neg pulmonary ROS,    breath sounds clear to auscultation       Cardiovascular hypertension, Pt. on medications  Rhythm:Regular Rate:Normal     Neuro/Psych  Neuromuscular disease    GI/Hepatic Neg liver ROS, GERD  ,  Endo/Other  diabetes, Type 2, Insulin Dependent, Oral Hypoglycemic Agents  Renal/GU CRFRenal disease     Musculoskeletal   Abdominal   Peds  Hematology  (+) Blood dyscrasia, anemia ,   Anesthesia Other Findings   Reproductive/Obstetrics                             Anesthesia Physical Anesthesia Plan  ASA: 2  Anesthesia Plan: MAC   Post-op Pain Management: Minimal or no pain anticipated   Induction:   PONV Risk Score and Plan: 2 and Propofol infusion, Ondansetron and Treatment may vary due to age or medical condition  Airway Management Planned: Natural Airway and Simple Face Mask  Additional Equipment:   Intra-op Plan:   Post-operative Plan:   Informed Consent: I have reviewed the patients History and Physical, chart, labs and discussed the procedure including the risks, benefits and alternatives for the proposed anesthesia with the patient or authorized representative who has indicated his/her understanding and acceptance.       Plan Discussed with: CRNA  Anesthesia Plan Comments:         Anesthesia Quick Evaluation

## 2021-07-20 NOTE — Transfer of Care (Signed)
Immediate Anesthesia Transfer of Care Note  Patient: Chelsea English  Procedure(s) Performed: BIOPSY OF RIGHT TEMPORAL ARTERY (Right: Head)  Patient Location: PACU  Anesthesia Type:MAC  Level of Consciousness: drowsy  Airway & Oxygen Therapy: Patient Spontanous Breathing  Post-op Assessment: Report given to RN and Post -op Vital signs reviewed and stable  Post vital signs: Reviewed and stable  Last Vitals:  Vitals Value Taken Time  BP 134/76 07/20/21 1215  Temp    Pulse 85   Resp 12 07/20/21 1216  SpO2 95   Vitals shown include unvalidated device data.  Last Pain:  Vitals:   07/20/21 1037  TempSrc:   PainSc: 0-No pain      Patients Stated Pain Goal: 3 (81/82/99 3716)  Complications: No notable events documented.

## 2021-07-20 NOTE — Op Note (Signed)
    NAME: Shae Hinnenkamp    MRN: [id number] DOB: [birth year]    DATE OF OPERATION: 07/20/2021  PREOP DIAGNOSIS:    Rule out temporal arteritis  POSTOP DIAGNOSIS:    Same  PROCEDURE:    Right temporal artery biopsy  SURGEON: Di Kindle. Edilia Bo, MD  ASSIST: None  ANESTHESIA: Local with sedation  EBL: Minimal  INDICATIONS:    Velicia Dejager is a 61 y.o. female with right temporal pain and elevated sed rate, headache and visual disturbance.  She was felt to potentially have temporal arteritis and we were asked to provide a temporal artery biopsy on the right.  FINDINGS:   An approximately 2-1/2 cm segment of the temporal artery was sent to pathology  TECHNIQUE:   Patient was taken to the operating room and I looked at the right temporal artery with the SonoSite.  It could clearly be identified.  The right temporal area was prepped and draped in usual sterile fashion after the patient was sedated.  Longitudinal incision was made just anterior to the tragus of the ear where the artery was dissected free over a length of about 2-1/2 cm.  It was ligated proximally and distally and some branches were divided between 3-4 0 silk ties.  The segment of artery was sent to pathology.  Hemostasis was obtained in the wound.  The wound was closed with a deep layer of 3-0 Vicryl and the skin closed with 4-0 Monocryl.  Dermabond was applied.  The patient tolerated the procedure well was transferred to the recovery room in stable condition.  All needle and sponge counts were correct.   Waverly Ferrari, MD, FACS [facility]  DATE OF DICTATION:   07/20/2021

## 2021-07-20 NOTE — Anesthesia Postprocedure Evaluation (Signed)
Anesthesia Post Note  Patient: Product/process development scientist  Procedure(s) Performed: BIOPSY OF RIGHT TEMPORAL ARTERY (Right: Head)     Patient location during evaluation: PACU Anesthesia Type: MAC Level of consciousness: awake and alert Pain management: pain level controlled Vital Signs Assessment: post-procedure vital signs reviewed and stable Respiratory status: spontaneous breathing, nonlabored ventilation, respiratory function stable and patient connected to nasal cannula oxygen Cardiovascular status: stable and blood pressure returned to baseline Postop Assessment: no apparent nausea or vomiting Anesthetic complications: no   No notable events documented.  Last Vitals:  Vitals:   07/20/21 1304 07/20/21 1558  BP: 129/71 (!) 149/67  Pulse: 79 78  Resp: 15 17  Temp: 36.5 C 36.7 C  SpO2: 100% 98%    Last Pain:  Vitals:   07/20/21 1558  TempSrc: Oral  PainSc:                  Brendan Gruwell

## 2021-07-20 NOTE — Interval H&P Note (Signed)
History and Physical Interval Note:  07/20/2021 11:13 AM  Chelsea English  has presented today for surgery, with the diagnosis of rule out temporal arteries.  The various methods of treatment have been discussed with the patient and family. After consideration of risks, benefits and other options for treatment, the patient has consented to  Procedure(s): BIOPSY TEMPORAL ARTERY (Right) as a surgical intervention.  The patient's history has been reviewed, patient examined, no change in status, stable for surgery.  I have reviewed the patient's chart and labs.  Questions were answered to the patient's satisfaction.     Chelsea English

## 2021-07-20 NOTE — Progress Notes (Signed)
CRNA talked with Anesthesia and decided not to give Ancef 2 gm.  Patient has PCN allergy.

## 2021-07-21 ENCOUNTER — Encounter (HOSPITAL_COMMUNITY): Payer: Self-pay | Admitting: Vascular Surgery

## 2021-07-21 DIAGNOSIS — G8929 Other chronic pain: Secondary | ICD-10-CM

## 2021-07-21 DIAGNOSIS — E669 Obesity, unspecified: Secondary | ICD-10-CM

## 2021-07-21 DIAGNOSIS — N179 Acute kidney failure, unspecified: Secondary | ICD-10-CM

## 2021-07-21 DIAGNOSIS — H35039 Hypertensive retinopathy, unspecified eye: Secondary | ICD-10-CM

## 2021-07-21 DIAGNOSIS — E11319 Type 2 diabetes mellitus with unspecified diabetic retinopathy without macular edema: Secondary | ICD-10-CM

## 2021-07-21 DIAGNOSIS — N182 Chronic kidney disease, stage 2 (mild): Secondary | ICD-10-CM | POA: Diagnosis not present

## 2021-07-21 DIAGNOSIS — R011 Cardiac murmur, unspecified: Secondary | ICD-10-CM

## 2021-07-21 DIAGNOSIS — H269 Unspecified cataract: Secondary | ICD-10-CM

## 2021-07-21 DIAGNOSIS — E785 Hyperlipidemia, unspecified: Secondary | ICD-10-CM

## 2021-07-21 DIAGNOSIS — I440 Atrioventricular block, first degree: Secondary | ICD-10-CM

## 2021-07-21 LAB — BASIC METABOLIC PANEL
Anion gap: 10 (ref 5–15)
BUN: 48 mg/dL — ABNORMAL HIGH (ref 8–23)
CO2: 24 mmol/L (ref 22–32)
Calcium: 9.1 mg/dL (ref 8.9–10.3)
Chloride: 101 mmol/L (ref 98–111)
Creatinine, Ser: 1.63 mg/dL — ABNORMAL HIGH (ref 0.44–1.00)
GFR, Estimated: 36 mL/min — ABNORMAL LOW (ref >60)
Glucose, Bld: 312 mg/dL — ABNORMAL HIGH (ref 70–99)
Potassium: 3.5 mmol/L (ref 3.5–5.1)
Sodium: 135 mmol/L (ref 135–145)

## 2021-07-21 LAB — CBC
HCT: 27.1 % — ABNORMAL LOW (ref 36.0–46.0)
Hemoglobin: 9.3 g/dL — ABNORMAL LOW (ref 12.0–15.0)
MCH: 27.8 pg (ref 26.0–34.0)
MCHC: 34.3 g/dL (ref 30.0–36.0)
MCV: 80.9 fL (ref 80.0–100.0)
Platelets: 248 10*3/uL (ref 150–400)
RBC: 3.35 MIL/uL — ABNORMAL LOW (ref 3.87–5.11)
RDW: 12.7 % (ref 11.5–15.5)
WBC: 12.9 10*3/uL — ABNORMAL HIGH (ref 4.0–10.5)
nRBC: 0 % (ref 0.0–0.2)

## 2021-07-21 LAB — GLUCOSE, CAPILLARY
Glucose-Capillary: 334 mg/dL — ABNORMAL HIGH (ref 70–99)
Glucose-Capillary: 313 mg/dL — ABNORMAL HIGH (ref 70–99)
Glucose-Capillary: 287 mg/dL — ABNORMAL HIGH (ref 70–99)
Glucose-Capillary: 363 mg/dL — ABNORMAL HIGH (ref 70–99)
Glucose-Capillary: 216 mg/dL — ABNORMAL HIGH (ref 70–99)

## 2021-07-21 LAB — SURGICAL PATHOLOGY

## 2021-07-21 MED ORDER — INSULIN ASPART 100 UNIT/ML IJ SOLN
0.0000 [IU] | Freq: Three times a day (TID) | INTRAMUSCULAR | Status: DC
  Administered 2021-07-21: 20 [IU] via SUBCUTANEOUS
  Administered 2021-07-21: 11 [IU] via SUBCUTANEOUS
  Administered 2021-07-21: 15 [IU] via SUBCUTANEOUS
  Administered 2021-07-22: 4 [IU] via SUBCUTANEOUS
  Administered 2021-07-22: 7 [IU] via SUBCUTANEOUS

## 2021-07-21 MED ORDER — INSULIN ASPART 100 UNIT/ML IJ SOLN: 10.0000 [IU] | Freq: Three times a day (TID) | INTRAMUSCULAR | Status: AC

## 2021-07-21 MED ORDER — INSULIN GLARGINE-YFGN 100 UNIT/ML ~~LOC~~ SOLN
25.0000 [IU] | Freq: Every day | SUBCUTANEOUS | Status: DC
  Filled 2021-07-21: qty 0.25

## 2021-07-21 MED ORDER — INSULIN ASPART 100 UNIT/ML IJ SOLN
0.0000 [IU] | Freq: Every day | INTRAMUSCULAR | Status: DC
  Administered 2021-07-21: 2 [IU] via SUBCUTANEOUS

## 2021-07-21 MED ORDER — INSULIN ASPART PROT & ASPART (70-30 MIX) 100 UNIT/ML ~~LOC~~ SUSP
40.0000 [IU] | Freq: Every day | SUBCUTANEOUS | Status: DC
  Administered 2021-07-21: 40 [IU] via SUBCUTANEOUS
  Filled 2021-07-21: qty 10

## 2021-07-21 MED ORDER — INSULIN ASPART 100 UNIT/ML IJ SOLN
10.0000 [IU] | Freq: Three times a day (TID) | INTRAMUSCULAR | Status: DC
  Administered 2021-07-21: 10 [IU] via SUBCUTANEOUS

## 2021-07-21 MED ORDER — INSULIN ASPART PROT & ASPART (70-30 MIX) 100 UNIT/ML ~~LOC~~ SUSP
60.0000 [IU] | Freq: Every day | SUBCUTANEOUS | Status: DC
  Administered 2021-07-22: 60 [IU] via SUBCUTANEOUS
  Filled 2021-07-21: qty 10

## 2021-07-21 MED ORDER — PREDNISONE 50 MG PO TABS: 60.0000 mg | ORAL_TABLET | Freq: Every day | ORAL | Status: DC

## 2021-07-21 MED ORDER — PREDNISONE 50 MG PO TABS
60.0000 mg | ORAL_TABLET | Freq: Every day | ORAL | Status: DC
  Administered 2021-07-22: 60 mg via ORAL
  Filled 2021-07-21: qty 1

## 2021-07-21 NOTE — Progress Notes (Signed)
PROGRESS NOTE    Chelsea English  [id number] DOB: August 25, 1960 DOA: 07/17/2021 PCP: Donnal Debar, PA-C  Chief Complaint  Patient presents with   Eye Problem    Brief Narrative:  61 year old Arabic female with Chelsea English history of type 2 diabetes, hypertension, CKD stage II presents to the ER on 07/17/2021 with [name] 3-day history of right-sided eye pain, headache and vision changes.    Upon arrival to ED: Patient was afebrile, pulse 86, respiratory rate 18, blood pressure 168/73, maintaining oxygen saturation on room air.  Evaluated by ophthalmology and she had dilated right eye exam.  Ophthalmology felt pins patient's symptoms concerning for temporal arteritis.  CT orbits: Resulted negative.  Solu-Medrol was started and patient transferred to [facility] for further evaluation and management.     Assessment & Plan:   Principal Problem:   Vision changes - concern for right side temporal arteritis Active Problems:   Type 2 diabetes mellitus with diabetic polyneuropathy, with long-term current use of insulin (HCC)   Diabetic retinopathy (HCC)   Cataract, left eye   Hypertensive retinopathy   CKD (chronic kidney disease) stage 2, GFR 60-89 ml/min   AKI (acute kidney injury) ([place])   Essential hypertension   Dyslipidemia   Chronic pain   Systolic murmur   AV block, 1st degree   GERD (gastroesophageal reflux disease)   Assessment and Plan: * Vision changes - concern for right side temporal arteritis S/p temporal artery bx 5/30 Benign segment of artery, negative for arteritis CRP wnl, sed rate 78 (elevated) Normal TSH, normal ana, normal SSA/SSB, nonreactive RPR, mildly elevated RF, negative CCP Neurology recommended MRI brain with/without contrast.  MRI orbits with/without contrast.  No acute findings or cause for symptoms.  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  Steroids and outpatient f/u with ophtho  Given concerning presentation, will  continue steroids at this time, will discuss further with ophtho prior to discharge  Type 2 diabetes mellitus with diabetic polyneuropathy, with long-term current use of insulin (HCC) Uncontrolled At home she's on 60 units 70/30 q am and 40 units 70/30 qpm without reported hypoglycemia per discussion with her daughter Will resume home insulin regimen with SSI while on steroids Follow closely   Hypertensive retinopathy ophtho follow up  Cataract, left eye ophtho follow up  Diabetic retinopathy ([place]) Follow with ophtho  AKI (acute kidney injury) ([place]) Mild, continue to monitor Hold HCTZ and losartan  CKD (chronic kidney disease) stage 2, GFR 60-89 ml/min Baseline Scr 1.1-1.4. hold HCTZ. Hold losartan 50 mg qday with elevation in creatinine  Dyslipidemia lipitor  Essential hypertension Holding HCTZ and losartan for now  Chronic pain Flexeril for back pain  AV block, 1st degree Outpatient f/u  Systolic murmur Outpatient f/u  GERD (gastroesophageal reflux disease) Stable.       DVT prophylaxis: heparin Code Status: full Family Communication: daughter at bedside Disposition:   Status is: Inpatient Remains inpatient appropriate because: pending better BG control, further discussion with ophtho   Consultants:  Ophtho Vascular neurology  Procedures:  Temporal artery bx 5/30   Antimicrobials:  Anti-infectives (From admission, onward)    Start     Dose/Rate Route Frequency Ordered Stop   07/20/21 1145  vancomycin (VANCOREADY) IVPB 1250 mg/250 mL        1,250 mg 166.7 mL/hr over 90 Minutes Intravenous  Once 07/20/21 1132 07/20/21 1240   07/20/21 1130  vancomycin (VANCOCIN) 1,000 mg in sodium chloride 0.9 % 250 mL IVPB  Status:  Discontinued        1,000 mg 250 mL/hr over 60 Minutes Intravenous  Once 07/20/21 1126 07/20/21 1132   07/20/21 1103  ceFAZolin (ANCEF) 2-4 GM/100ML-% IVPB       Note to Pharmacy: Marga Melnick C: cabinet override      07/20/21  1103 07/20/21 2314       Subjective: Vision better Discussed with patient via daughter [name] nurse who helped with translation  Objective: Vitals:   07/20/21 2338 07/21/21 0504 07/21/21 0805 07/21/21 1707  BP: (!) 153/69 136/72 (!) 150/72 134/66  Pulse: 81 75 73 76  Resp: 17 18 16 16   Temp: 98.1 F (36.7 C) 98.1 F (36.7 C) 98 F (36.7 C) 98 F (36.7 C)  TempSrc: Oral Oral Oral Oral  SpO2: 98% 97% 100% 98%  Weight:      Height:        Intake/Output Summary (Last 24 hours) at 07/21/2021 1901 Last data filed at 07/20/2021 2338 Gross per 24 hour  Intake 240 ml  Output 400 ml  Net -160 ml   Filed Weights   07/17/21 1534 07/20/21 1037  Weight: 77.6 kg 77.6 kg    Examination:  General exam: Appears calm and comfortable  Incision noted on R temple  Respiratory system: unlabored Cardiovascular system: RRR Gastrointestinal system: Abdomen is nondistended, soft and nontender.  Central nervous system: Alert and oriented. R ptosis. Extremities: no LEE    Data Reviewed: I have personally reviewed following labs and imaging studies  CBC: Recent Labs  Lab 07/17/21 1732 07/18/21 0322 07/19/21 0834 07/20/21 0211 07/21/21 0129  WBC 12.4* 13.2* 14.2* 13.0* 12.9*  NEUTROABS 8.2* 11.8*  --  11.0*  --   HGB 10.4* 9.6* 9.6* 9.2* 9.3*  HCT 31.6* 30.0* 29.5* 28.1* 27.1*  MCV 83.2 83.3 82.6 82.2 80.9  PLT 285 270 269 252 [id number]    Basic Metabolic Panel: Recent Labs  Lab 07/17/21 1732 07/18/21 0322 07/19/21 0834 07/20/21 0211 07/21/21 0129  NA 134* 136 136 134* 135  K 4.8 4.9 4.4 4.0 3.5  CL 104 105 103 100 101  CO2 24 21* 22 23 24   GLUCOSE 221* 216* 399* 325* 312*  BUN 29* 27* 41* 50* 48*  CREATININE 1.29* 1.38* 1.53* 1.71* 1.63*  CALCIUM 9.4 9.6 10.1 9.5 9.1  MG  --  1.9  --   --   --     GFR: Estimated Creatinine Clearance: 35.8 mL/min (Byard Carranza) (by C-G formula based on SCr of 1.63 mg/dL (H)).  Liver Function Tests: Recent Labs  Lab 07/18/21 0322  AST 21  ALT  20  ALKPHOS 116  BILITOT 0.4  PROT 7.9  ALBUMIN 3.6    CBG: Recent Labs  Lab 07/20/21 2006 07/21/21 0550 07/21/21 0722 07/21/21 1222 07/21/21 1737  GLUCAP 434* 334* 313* 287* 363*     No results found for this or any previous visit (from the past 240 hour(s)).       Radiology Studies: No results found.      Scheduled Meds:  amLODipine  10 mg Oral Daily   aspirin  81 mg Oral Daily   atorvastatin  40 mg Oral QHS   ferrous sulfate  650 mg Oral Q breakfast   gabapentin  100-300 mg Oral QHS   heparin  5,000 Units Subcutaneous Q8H   insulin aspart  0-20 Units Subcutaneous TID WC   insulin aspart  0-5 Units Subcutaneous QHS   insulin aspart protamine- aspart  40 Units  Subcutaneous Q supper   [START ON 07/22/2021] insulin aspart protamine- aspart  60 Units Subcutaneous Q breakfast   melatonin  3 mg Oral QHS   polyethylene glycol  17 g Oral Daily   [START ON 07/22/2021] predniSONE  60 mg Oral Q breakfast   Continuous Infusions:  sodium chloride 10 mL/hr (07/20/21 1514)     LOS: 3 days    Time spent: over 30 min    Fayrene Helper, MD [facility]   To contact the attending provider between 7A-7P or the covering provider during after hours 7P-7A, please log into the web site [email or web address] and access using universal [place] password for that web site. If you do not have the password, please call the hospital operator.  07/21/2021, 7:01 PM

## 2021-07-21 NOTE — Assessment & Plan Note (Signed)
improved

## 2021-07-21 NOTE — Hospital Course (Signed)
61 year old Arabic female with Kenderick Kobler history of type 2 diabetes, hypertension, CKD stage II presents to the ER on 07/17/2021 with Alexsandro Salek 3-day history of right-sided eye pain, headache and vision changes.    Upon arrival to ED: Patient was afebrile, pulse 86, respiratory rate 18, blood pressure 168/73, maintaining oxygen saturation on room air.  Evaluated by ophthalmology and she had dilated right eye exam.  Ophthalmology felt pins patient's symptoms concerning for temporal arteritis.  CT orbits: Resulted negative.  Solu-Medrol was started and patient transferred to [facility] for further evaluation and management.   She's improved with IV steroids.  She's s/p temporal artery biopsy which was negative for arteritis.  Plan for steroids, outpatient rheumatology and ophthalmology follow up.  See below for additional details

## 2021-07-21 NOTE — Assessment & Plan Note (Signed)
Outpatient f/u  

## 2021-07-21 NOTE — Assessment & Plan Note (Signed)
noted 

## 2021-07-21 NOTE — Assessment & Plan Note (Signed)
Follow with ophtho

## 2021-07-21 NOTE — Assessment & Plan Note (Signed)
Flexeril for back pain

## 2021-07-21 NOTE — Assessment & Plan Note (Signed)
ophtho follow up 

## 2021-07-21 NOTE — Assessment & Plan Note (Signed)
lipitor

## 2021-07-21 NOTE — Progress Notes (Addendum)
[facility]  Subjective  - no new complaints regarding right temporal artery incision   Objective (!) 150/72 73 98 F (36.7 C) (Oral) 16 100%  Intake/Output Summary (Last 24 hours) at 07/21/2021 5009 Last data filed at 07/20/2021 2338 Gross per 24 hour  Intake 1276 ml  Output 410 ml  Net 866 ml    Right temporal artery biopsy site healing well without hematoma or erythema Lungs non labored breathing, no acute distressed  Assessment/Planning: POD # 1 Right temporal artery biopsy  Rule out temporal arteritis Stable post op Call [facility] with concerns.   Mosetta Pigeon 07/21/2021 8:08 AM --  I have interviewed the patient and examined the patient. I agree with the findings by the PA.  Cari Caraway, MD

## 2021-07-21 NOTE — Assessment & Plan Note (Signed)
ophtho follow up

## 2021-07-22 LAB — CBC WITH DIFFERENTIAL/PLATELET
Abs Immature Granulocytes: 0.15 10*3/uL — ABNORMAL HIGH (ref 0.00–0.07)
Basophils Absolute: 0 10*3/uL (ref 0.0–0.1)
Basophils Relative: 0 %
Eosinophils Absolute: 0 10*3/uL (ref 0.0–0.5)
Eosinophils Relative: 0 %
HCT: 28.2 % — ABNORMAL LOW (ref 36.0–46.0)
Hemoglobin: 9.4 g/dL — ABNORMAL LOW (ref 12.0–15.0)
Immature Granulocytes: 1 %
Lymphocytes Relative: 13 %
Lymphs Abs: 1.9 10*3/uL (ref 0.7–4.0)
MCH: 27.2 pg (ref 26.0–34.0)
MCHC: 33.3 g/dL (ref 30.0–36.0)
MCV: 81.5 fL (ref 80.0–100.0)
Monocytes Absolute: 1.6 10*3/uL — ABNORMAL HIGH (ref 0.1–1.0)
Monocytes Relative: 11 %
Neutro Abs: 11.4 10*3/uL — ABNORMAL HIGH (ref 1.7–7.7)
Neutrophils Relative %: 75 %
Platelets: 238 10*3/uL (ref 150–400)
RBC: 3.46 MIL/uL — ABNORMAL LOW (ref 3.87–5.11)
RDW: 12.6 % (ref 11.5–15.5)
WBC: 15.1 10*3/uL — ABNORMAL HIGH (ref 4.0–10.5)
nRBC: 0 % (ref 0.0–0.2)

## 2021-07-22 LAB — COMPREHENSIVE METABOLIC PANEL
ALT: 51 U/L — ABNORMAL HIGH (ref 0–44)
AST: 34 U/L (ref 15–41)
Albumin: 2.3 g/dL — ABNORMAL LOW (ref 3.5–5.0)
Alkaline Phosphatase: 81 U/L (ref 38–126)
Anion gap: 8 (ref 5–15)
BUN: 44 mg/dL — ABNORMAL HIGH (ref 8–23)
CO2: 25 mmol/L (ref 22–32)
Calcium: 8.8 mg/dL — ABNORMAL LOW (ref 8.9–10.3)
Chloride: 99 mmol/L (ref 98–111)
Creatinine, Ser: 1.42 mg/dL — ABNORMAL HIGH (ref 0.44–1.00)
GFR, Estimated: 42 mL/min — ABNORMAL LOW (ref >60)
Glucose, Bld: 181 mg/dL — ABNORMAL HIGH (ref 70–99)
Potassium: 3.6 mmol/L (ref 3.5–5.1)
Sodium: 132 mmol/L — ABNORMAL LOW (ref 135–145)
Total Bilirubin: 0.3 mg/dL (ref 0.3–1.2)
Total Protein: 6.1 g/dL — ABNORMAL LOW (ref 6.5–8.1)

## 2021-07-22 LAB — GLUCOSE, CAPILLARY
Glucose-Capillary: 169 mg/dL — ABNORMAL HIGH (ref 70–99)
Glucose-Capillary: 203 mg/dL — ABNORMAL HIGH (ref 70–99)

## 2021-07-22 LAB — MAGNESIUM: Magnesium: 2.1 mg/dL (ref 1.7–2.4)

## 2021-07-22 LAB — PHOSPHORUS: Phosphorus: 3 mg/dL (ref 2.5–4.6)

## 2021-07-22 MED ORDER — PREDNISONE 20 MG PO TABS: 60.0000 mg | ORAL_TABLET | Freq: Every day | ORAL | 0 refills | Status: AC

## 2021-07-22 NOTE — Discharge Summary (Signed)
Physician Discharge Summary  Chelsea English [id number] DOB: 06/15/1960 DOA: 07/17/2021  PCP: Donnal Debar, Chelsea English  Admit date: 07/17/2021 Discharge date: 07/22/2021  Time spent: 40 minutes  Recommendations for Outpatient Follow-up:  Follow outpatient CBC/CMP  Follow with ophthalmology outpatient Follow with rheum outpatient Needs follow up regarding steroid taper long term for temporal arteritis outpatient with rheum or ophtho (or PCP), prescribed 1 month steroids, will need taper prescribed Follow renal function outpatient - may need to hold metformin or stop losartan if renal function worsening  Follow up insulin dose while on steroids outpatient, may need adjustment   Discharge Diagnoses:  Principal Problem:   Vision changes - concern for right side temporal arteritis Active Problems:   Type 2 diabetes mellitus with diabetic polyneuropathy, with long-term current use of insulin (HCC)   Diabetic retinopathy (Chelsea English)   Cataract, left eye   Hypertensive retinopathy   CKD (chronic kidney disease) stage 2, GFR 60-89 ml/min   AKI (acute kidney injury) (Chelsea English)   Essential hypertension   Dyslipidemia   Chronic pain   Systolic murmur   AV block, 1st degree   GERD (gastroesophageal reflux disease)   Obesity (BMI 30-39.9)   Discharge Condition: stable  Diet recommendation: heart healthy, diabetic  Filed Weights   07/17/21 1534 07/20/21 1037  Weight: 77.6 kg 77.6 kg    History of present illness:  61 year old Arabic female with Chelsea English history of type 2 diabetes, hypertension, CKD stage II presents to the ER on 07/17/2021 with Chelsea English 3-day history of right-sided eye pain, headache and vision changes.    Upon arrival to ED: Patient was afebrile, pulse 86, respiratory rate 18, blood pressure 168/73, maintaining oxygen saturation on room air.  Evaluated by ophthalmology and she had dilated right eye exam.  Ophthalmology felt pins patient's symptoms concerning for temporal arteritis.  CT  orbits: Resulted negative.  Solu-Medrol was started and patient transferred to [facility] for further evaluation and management.   She's improved with IV steroids.  She's s/p temporal artery biopsy which was negative for arteritis.  Plan for steroids, outpatient English and ophthalmology follow up.  See below for additional details  Hospital Course:  Assessment and Plan: * Vision changes - concern for right side temporal arteritis S/p temporal artery bx 5/30 Benign segment of artery, negative for arteritis CRP wnl, sed rate 78 (elevated) Normal TSH, normal ana, normal SSA/SSB, nonreactive RPR, mildly elevated RF, negative CCP Neurology recommended MRI brain with/without contrast.  MRI orbits with/without contrast.  No acute findings or cause for symptoms.  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  Steroids and outpatient f/u with ophtho  Given concerning presentation, will continue steroids at this time, plan for rheum referral and ophtho follow up - steroid taper per outpatient providers  Type 2 diabetes mellitus with diabetic polyneuropathy, with long-term current use of insulin (Chelsea English) Uncontrolled Doing well on 70/30 home regimen Follow closely on steroids with PCP Will resume home insulin regimen with SSI while on steroids  Hypertensive retinopathy ophtho follow up  Cataract, left eye ophtho follow up  Diabetic retinopathy (Chelsea English) Follow with ophtho  AKI (acute kidney injury) (Chelsea English) improved  CKD (chronic kidney disease) stage 2, GFR 60-89 ml/min Baseline Scr 1.1-1.4. hold HCTZ at discharge Chelsea English to resume losartan Follow within 1 week to ensure stability - may need to hold losartan/metformin if worsening renal functino  Dyslipidemia lipitor  Essential hypertension Holding HCTZ Resume losartan, continue amlodipine  Chronic pain Flexeril for back  pain  AV block, 1st degree Outpatient f/u  Systolic murmur Outpatient  f/u  GERD (gastroesophageal reflux disease) Stable.  Obesity (BMI 30-39.9) noted      Procedures: Temporal artery bx   Consultations: Vascular Ophthalmology neurology  Discharge Exam: Vitals:   07/22/21 0625 07/22/21 0813  BP: (!) 149/73 (!) 141/79  Pulse: 71 67  Resp: 15 17  Temp: 98.1 F (36.7 C) 98 F (36.7 C)  SpO2: 100% 99%   No new complaints Daughter helped translate (RN) Plan for d/c discussed  General: No acute distress. Cardiovascular: RRR Lungs: unlabored Abdomen: Soft, nontender, nondistended  Neurological: Alert and oriented 3. Moves all extremities 4  Extremities: No clubbing or cyanosis. No edema.  Discharge Instructions   Discharge Instructions     Ambulatory referral to English   Complete by: As directed    Call MD for:  difficulty breathing, headache or visual disturbances   Complete by: As directed    Call MD for:  extreme fatigue   Complete by: As directed    Call MD for:  hives   Complete by: As directed    Call MD for:  persistant dizziness or light-headedness   Complete by: As directed    Call MD for:  persistant nausea and vomiting   Complete by: As directed    Call MD for:  redness, tenderness, or signs of infection (pain, swelling, redness, odor or green/yellow discharge around incision site)   Complete by: As directed    Call MD for:  severe uncontrolled pain   Complete by: As directed    Call MD for:  temperature >100.4   Complete by: As directed    Diet - low sodium heart healthy   Complete by: As directed    Discharge instructions   Complete by: As directed    You were seen with symptoms concerning for giant cell arteritis.   You've improved with steroids.  Your biopsy was negative, but we'll continue your steroids given your concerning presentation.  We made an appointment with Dr. Sherrine Maples in [facility] on Friday, June 9th, at 8:30 am.  The address is [address]  The phone number for the office is  [phone].  I'll also refer you to English for additional management.  You should receive Chelsea English phone call regarding an appointment.  You'll need to watch your blood sugars closely on steroids.  Follow up with your PCP to determine if you need to adjust your insulin dose.  I think for now, it's probably ok to continue as prescribed.    Stop your HCTZ.  Continue the losartan.  You'll need to follow up with your PCP for repeat labs within about 1 week.  If your kidney function is worse, you may need to stop the losartan and the metformin (discuss these issues with your PCP).  Return for new, recurrent, or worsening symptoms.  Please ask your PCP to request records from this hospitalization so they know what was done and what the next steps will be.   Discharge wound care:   Complete by: As directed    Per vascular surgery   Increase activity slowly   Complete by: As directed       Allergies as of 07/22/2021       Reactions   Penicillins Anaphylaxis        Medication List     STOP taking these medications    hydrochlorothiazide 25 MG tablet Commonly known as: HYDRODIURIL   naproxen sodium 220  MG tablet Commonly known as: ALEVE       TAKE these medications    amLODipine 5 MG tablet Commonly known as: NORVASC Take 5 mg by mouth at bedtime.   aspirin 81 MG chewable tablet Chew 81 mg by mouth daily.   atorvastatin 40 MG tablet Commonly known as: LIPITOR Take 40 mg by mouth at bedtime.   benzonatate 100 MG capsule Commonly known as: TESSALON Take 100 mg by mouth 2 (two) times daily as needed for cough.   cyclobenzaprine 5 MG tablet Commonly known as: FLEXERIL Take 10 mg by mouth 2 (two) times daily as needed for muscle spasms.   ferrous sulfate 325 (65 FE) MG tablet Take 650 mg by mouth daily with breakfast.   gabapentin 100 MG capsule Commonly known as: NEURONTIN Take 100-300 mg by mouth at bedtime.   HumuLIN 70/30 (70-30) 100 UNIT/ML injection Generic  drug: insulin NPH-regular Human Inject 40-60 Units into the skin See admin instructions. Injecting 60 units in the AM and 40 units at bedtime.   Januvia 100 MG tablet Generic drug: sitaGLIPtin Take 100 mg by mouth daily.   losartan 50 MG tablet Commonly known as: COZAAR Take 50 mg by mouth daily.   metFORMIN 500 MG tablet Commonly known as: GLUCOPHAGE Take 500 mg by mouth 2 (two) times daily.   MULTI FOR HER 50+ PO Take 1 tablet by mouth daily.   predniSONE 20 MG tablet Commonly known as: DELTASONE Take 3 tablets (60 mg total) by mouth daily with breakfast for 28 days. Follow with ophthalmology and/or English for questions regarding taper after 2 weeks Start taking on: July 23, 2021               Discharge Care Instructions  (From admission, onward)           Start     Ordered   07/22/21 0000  Discharge wound care:       Comments: Per vascular surgery   07/22/21 1157           Allergies  Allergen Reactions   Penicillins Anaphylaxis    Follow-up Information     Chelsea Skinner, MD Follow up.   Specialty: Ophthalmology Why: Your follow up appointment will be in the [facility].  [place], Chelsea English.  You have an appointment scheduled for Friday, June 9th, at 8:30.  The phone number for the [facility] office is [phone]. Contact information: [place] [address] [phone]         English [facility], Chelsea English Follow up.   Specialty: Internal Medicine        Chelsea English Follow up.   Specialty: English Why: Call if you don't hear anything regarding Chelsea English referral Contact information: [address] [place] [phone]                 The results of significant diagnostics from this hospitalization (including imaging, microbiology, ancillary and laboratory) are listed below for reference.    Significant Diagnostic  Studies: MR BRAIN W WO CONTRAST  Result Date: 07/18/2021 CLINICAL DATA:  Monocular vision loss EXAM: MRI HEAD AND ORBITS WITHOUT AND WITH CONTRAST TECHNIQUE: Multiplanar, multiecho pulse sequences of the brain and surrounding structures were obtained without and with intravenous contrast. Multiplanar, multiecho pulse sequences of the orbits and surrounding structures were obtained including fat saturation techniques, before and after intravenous contrast administration. CONTRAST:  22mL GADAVIST GADOBUTROL 1  MMOL/ML IV SOLN COMPARISON:  CT of the orbits from yesterday FINDINGS: MRI HEAD FINDINGS Brain: No acute infarction, hemorrhage, hydrocephalus, extra-axial collection or mass lesion. Indistinct FLAIR hyperintensity in the pons, usually from chronic microvascular ischemia. Chronic lacune in the upper right brainstem. Age congruent brain volume. Vascular: Normal flow voids. Skull and upper cervical spine: Normal marrow signal. MRI ORBITS FINDINGS Orbits: Right cataract resection. Otherwise symmetric orbits with normal appearance of the optic nerve sheath complexes, lacrimal glands, extraocular muscles, and orbital fat. Unremarkable chiasm and suprasellar cistern. Visualized sinuses: Clear Soft tissues: Negative IMPRESSION: 1. No acute finding or specific cause for symptoms. 2. 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 Electronically Signed   By: Jorje Guild M.D.   On: 07/18/2021 12:21   CT Orbits W Contrast  Result Date: 07/17/2021 CLINICAL DATA:  Orbital cellulitis EXAM: CT ORBITS WITH CONTRAST TECHNIQUE: Multidetector CT images was performed according to the standard protocol following intravenous contrast administration. RADIATION DOSE REDUCTION: This exam was performed according to the departmental dose-optimization program which includes automated exposure control, adjustment of the mA and/or kV according to patient size and/or use of iterative reconstruction  technique. CONTRAST:  28mL OMNIPAQUE IOHEXOL 300 MG/ML  SOLN COMPARISON:  None Available. FINDINGS: Orbits: No orbital mass or evidence of inflammation. Normal appearance of the globes, optic nerve-sheath complexes, extraocular muscles, orbital fat and lacrimal glands. Visible paranasal sinuses: Clear. Soft tissues: Normal. Osseous: No fracture or aggressive lesion. Limited intracranial: No acute or significant finding. IMPRESSION: Unremarkable CT of the orbits. No visible edema or fluid collection. Electronically Signed   By: Margaretha Sheffield M.D.   On: 07/17/2021 18:50   MR ORBITS W WO CONTRAST  Result Date: 07/18/2021 CLINICAL DATA:  Monocular vision loss EXAM: MRI HEAD AND ORBITS WITHOUT AND WITH CONTRAST TECHNIQUE: Multiplanar, multiecho pulse sequences of the brain and surrounding structures were obtained without and with intravenous contrast. Multiplanar, multiecho pulse sequences of the orbits and surrounding structures were obtained including fat saturation techniques, before and after intravenous contrast administration. CONTRAST:  57mL GADAVIST GADOBUTROL 1 MMOL/ML IV SOLN COMPARISON:  CT of the orbits from yesterday FINDINGS: MRI HEAD FINDINGS Brain: No acute infarction, hemorrhage, hydrocephalus, extra-axial collection or mass lesion. Indistinct FLAIR hyperintensity in the pons, usually from chronic microvascular ischemia. Chronic lacune in the upper right brainstem. Age congruent brain volume. Vascular: Normal flow voids. Skull and upper cervical spine: Normal marrow signal. MRI ORBITS FINDINGS Orbits: Right cataract resection. Otherwise symmetric orbits with normal appearance of the optic nerve sheath complexes, lacrimal glands, extraocular muscles, and orbital fat. Unremarkable chiasm and suprasellar cistern. Visualized sinuses: Clear Soft tissues: Negative IMPRESSION: 1. No acute finding or specific cause for symptoms. 2. Chronic small vessel ischemia primarily in the pons with remote lacunar  infarct. Electronically Signed   By: Jorje Guild M.D.   On: 07/18/2021 12:21    Microbiology: No results found for this or any previous visit (from the past 240 hour(s)).   Labs: Basic Metabolic Panel: Recent Labs  Lab 07/18/21 0322 07/19/21 0834 07/20/21 0211 07/21/21 0129 07/22/21 0255  NA 136 136 134* 135 132*  K 4.9 4.4 4.0 3.5 3.6  CL 105 103 100 101 99  CO2 21* 22 23 24 25   GLUCOSE 216* 399* 325* 312* 181*  BUN 27* 41* 50* 48* 44*  CREATININE 1.38* 1.53* 1.71* 1.63* 1.42*  CALCIUM 9.6 10.1 9.5 9.1 8.8*  MG 1.9  --   --   --  2.1  PHOS  --   --   --   --  3.0   Liver Function Tests: Recent Labs  Lab 07/18/21 0322 07/22/21 0255  AST 21 34  ALT 20 51*  ALKPHOS 116 81  BILITOT 0.4 0.3  PROT 7.9 6.1*  ALBUMIN 3.6 2.3*   No results for input(s): LIPASE, AMYLASE in the last 168 hours. No results for input(s): AMMONIA in the last 168 hours. CBC: Recent Labs  Lab 07/17/21 1732 07/18/21 0322 07/19/21 0834 07/20/21 0211 07/21/21 0129 07/22/21 0255  WBC 12.4* 13.2* 14.2* 13.0* 12.9* 15.1*  NEUTROABS 8.2* 11.8*  --  11.0*  --  11.4*  HGB 10.4* 9.6* 9.6* 9.2* 9.3* 9.4*  HCT 31.6* 30.0* 29.5* 28.1* 27.1* 28.2*  MCV 83.2 83.3 82.6 82.2 80.9 81.5  PLT 285 270 269 252 248 238   Cardiac Enzymes: No results for input(s): CKTOTAL, CKMB, CKMBINDEX, TROPONINI in the last 168 hours. BNP: BNP (last 3 results) No results for input(s): BNP in the last 8760 hours.  ProBNP (last 3 results) No results for input(s): PROBNP in the last 8760 hours.  CBG: Recent Labs  Lab 07/21/21 1222 07/21/21 1737 07/21/21 2146 07/22/21 0814 07/22/21 1202  GLUCAP 287* 363* 216* 169* 203*       Signed:  Fayrene Helper MD.  [facility] 07/22/2021, 12:05 PM

## 2021-07-22 NOTE — Progress Notes (Signed)
VASCULAR SURGERY:  Her biopsy did not show temporal arteritis.  I updated the patient and her daughter.  Vascular surgery will be available as needed.  Cari Caraway, MD 7:05 AM

## 2022-04-15 ENCOUNTER — Encounter (HOSPITAL_BASED_OUTPATIENT_CLINIC_OR_DEPARTMENT_OTHER): Payer: Self-pay | Admitting: Emergency Medicine

## 2022-04-15 ENCOUNTER — Emergency Department (HOSPITAL_BASED_OUTPATIENT_CLINIC_OR_DEPARTMENT_OTHER): Payer: Medicaid Other

## 2022-04-15 ENCOUNTER — Inpatient Hospital Stay (HOSPITAL_BASED_OUTPATIENT_CLINIC_OR_DEPARTMENT_OTHER)
Admission: EM | Admit: 2022-04-15 | Discharge: 2022-04-20 | DRG: 286 | Disposition: A | Discharge status: Home Health | Payer: Medicaid Other | Attending: Internal Medicine | Admitting: Internal Medicine

## 2022-04-15 ENCOUNTER — Other Ambulatory Visit: Payer: Self-pay

## 2022-04-15 DIAGNOSIS — R7401 Elevation of levels of liver transaminase levels: Secondary | ICD-10-CM | POA: Diagnosis present

## 2022-04-15 DIAGNOSIS — I2510 Atherosclerotic heart disease of native coronary artery without angina pectoris: Secondary | ICD-10-CM | POA: Diagnosis present

## 2022-04-15 DIAGNOSIS — N189 Chronic kidney disease, unspecified: Secondary | ICD-10-CM

## 2022-04-15 DIAGNOSIS — E871 Hypo-osmolality and hyponatremia: Secondary | ICD-10-CM | POA: Diagnosis present

## 2022-04-15 DIAGNOSIS — E11319 Type 2 diabetes mellitus with unspecified diabetic retinopathy without macular edema: Secondary | ICD-10-CM | POA: Diagnosis present

## 2022-04-15 DIAGNOSIS — E1122 Type 2 diabetes mellitus with diabetic chronic kidney disease: Secondary | ICD-10-CM | POA: Diagnosis present

## 2022-04-15 DIAGNOSIS — D72829 Elevated white blood cell count, unspecified: Secondary | ICD-10-CM | POA: Diagnosis not present

## 2022-04-15 DIAGNOSIS — J811 Chronic pulmonary edema: Secondary | ICD-10-CM | POA: Diagnosis present

## 2022-04-15 DIAGNOSIS — Z1152 Encounter for screening for COVID-19: Secondary | ICD-10-CM

## 2022-04-15 DIAGNOSIS — Z79899 Other long term (current) drug therapy: Secondary | ICD-10-CM

## 2022-04-15 DIAGNOSIS — H5462 Unqualified visual loss, left eye, normal vision right eye: Secondary | ICD-10-CM | POA: Diagnosis present

## 2022-04-15 DIAGNOSIS — Z7982 Long term (current) use of aspirin: Secondary | ICD-10-CM

## 2022-04-15 DIAGNOSIS — J9601 Acute respiratory failure with hypoxia: Secondary | ICD-10-CM | POA: Diagnosis present

## 2022-04-15 DIAGNOSIS — N1832 Chronic kidney disease, stage 3b: Secondary | ICD-10-CM | POA: Diagnosis present

## 2022-04-15 DIAGNOSIS — Z6835 Body mass index (BMI) 35.0-35.9, adult: Secondary | ICD-10-CM

## 2022-04-15 DIAGNOSIS — E119 Type 2 diabetes mellitus without complications: Secondary | ICD-10-CM

## 2022-04-15 DIAGNOSIS — J681 Pulmonary edema due to chemicals, gases, fumes and vapors: Principal | ICD-10-CM

## 2022-04-15 DIAGNOSIS — J810 Acute pulmonary edema: Secondary | ICD-10-CM | POA: Diagnosis not present

## 2022-04-15 DIAGNOSIS — R911 Solitary pulmonary nodule: Secondary | ICD-10-CM | POA: Diagnosis present

## 2022-04-15 DIAGNOSIS — I130 Hypertensive heart and chronic kidney disease with heart failure and stage 1 through stage 4 chronic kidney disease, or unspecified chronic kidney disease: Secondary | ICD-10-CM | POA: Diagnosis present

## 2022-04-15 DIAGNOSIS — I350 Nonrheumatic aortic (valve) stenosis: Secondary | ICD-10-CM | POA: Diagnosis present

## 2022-04-15 DIAGNOSIS — I5031 Acute diastolic (congestive) heart failure: Secondary | ICD-10-CM

## 2022-04-15 DIAGNOSIS — I5033 Acute on chronic diastolic (congestive) heart failure: Secondary | ICD-10-CM | POA: Diagnosis present

## 2022-04-15 DIAGNOSIS — D631 Anemia in chronic kidney disease: Secondary | ICD-10-CM | POA: Diagnosis present

## 2022-04-15 DIAGNOSIS — N179 Acute kidney failure, unspecified: Secondary | ICD-10-CM | POA: Diagnosis present

## 2022-04-15 DIAGNOSIS — E1165 Type 2 diabetes mellitus with hyperglycemia: Secondary | ICD-10-CM | POA: Diagnosis present

## 2022-04-15 DIAGNOSIS — R6510 Systemic inflammatory response syndrome (SIRS) of non-infectious origin without acute organ dysfunction: Secondary | ICD-10-CM | POA: Insufficient documentation

## 2022-04-15 DIAGNOSIS — Z880 Allergy status to penicillin: Secondary | ICD-10-CM

## 2022-04-15 DIAGNOSIS — Z794 Long term (current) use of insulin: Secondary | ICD-10-CM | POA: Diagnosis not present

## 2022-04-15 DIAGNOSIS — I10 Essential (primary) hypertension: Secondary | ICD-10-CM | POA: Diagnosis present

## 2022-04-15 DIAGNOSIS — Z862 Personal history of diseases of the blood and blood-forming organs and certain disorders involving the immune mechanism: Secondary | ICD-10-CM

## 2022-04-15 DIAGNOSIS — E782 Mixed hyperlipidemia: Secondary | ICD-10-CM

## 2022-04-15 DIAGNOSIS — G4733 Obstructive sleep apnea (adult) (pediatric): Secondary | ICD-10-CM

## 2022-04-15 DIAGNOSIS — Z8249 Family history of ischemic heart disease and other diseases of the circulatory system: Secondary | ICD-10-CM | POA: Diagnosis not present

## 2022-04-15 DIAGNOSIS — E872 Acidosis, unspecified: Secondary | ICD-10-CM | POA: Diagnosis present

## 2022-04-15 DIAGNOSIS — E1169 Type 2 diabetes mellitus with other specified complication: Secondary | ICD-10-CM | POA: Diagnosis present

## 2022-04-15 DIAGNOSIS — R0902 Hypoxemia: Secondary | ICD-10-CM

## 2022-04-15 DIAGNOSIS — E66812 Obesity, class 2: Secondary | ICD-10-CM | POA: Diagnosis present

## 2022-04-15 DIAGNOSIS — E785 Hyperlipidemia, unspecified: Secondary | ICD-10-CM | POA: Diagnosis present

## 2022-04-15 DIAGNOSIS — E669 Obesity, unspecified: Secondary | ICD-10-CM | POA: Diagnosis present

## 2022-04-15 DIAGNOSIS — R7989 Other specified abnormal findings of blood chemistry: Secondary | ICD-10-CM | POA: Diagnosis present

## 2022-04-15 DIAGNOSIS — R0603 Acute respiratory distress: Secondary | ICD-10-CM | POA: Diagnosis present

## 2022-04-15 DIAGNOSIS — I509 Heart failure, unspecified: Secondary | ICD-10-CM | POA: Insufficient documentation

## 2022-04-15 DIAGNOSIS — Z833 Family history of diabetes mellitus: Secondary | ICD-10-CM

## 2022-04-15 HISTORY — DX: Nonrheumatic aortic (valve) stenosis: I35.0

## 2022-04-15 LAB — CBC WITH DIFFERENTIAL/PLATELET
Abs Immature Granulocytes: 0.12 10*3/uL — ABNORMAL HIGH (ref 0.00–0.07)
Basophils Absolute: 0.1 10*3/uL (ref 0.0–0.1)
Basophils Relative: 0 %
Eosinophils Absolute: 0.3 10*3/uL (ref 0.0–0.5)
Eosinophils Relative: 2 %
HCT: 30.3 % — ABNORMAL LOW (ref 36.0–46.0)
Hemoglobin: 9.4 g/dL — ABNORMAL LOW (ref 12.0–15.0)
Immature Granulocytes: 1 %
Lymphocytes Relative: 44 %
Lymphs Abs: 8.3 10*3/uL — ABNORMAL HIGH (ref 0.7–4.0)
MCH: 26.8 pg (ref 26.0–34.0)
MCHC: 31 g/dL (ref 30.0–36.0)
MCV: 86.3 fL (ref 80.0–100.0)
Monocytes Absolute: 1.1 10*3/uL — ABNORMAL HIGH (ref 0.1–1.0)
Monocytes Relative: 6 %
Neutro Abs: 9.1 10*3/uL — ABNORMAL HIGH (ref 1.7–7.7)
Neutrophils Relative %: 47 %
Platelets: 248 10*3/uL (ref 150–400)
RBC: 3.51 MIL/uL — ABNORMAL LOW (ref 3.87–5.11)
RDW: 13.9 % (ref 11.5–15.5)
WBC: 19.4 10*3/uL — ABNORMAL HIGH (ref 4.0–10.5)
nRBC: 0 % (ref 0.0–0.2)

## 2022-04-15 LAB — COMPREHENSIVE METABOLIC PANEL
ALT: 65 U/L — ABNORMAL HIGH (ref 0–44)
AST: 77 U/L — ABNORMAL HIGH (ref 15–41)
Albumin: 4.2 g/dL (ref 3.5–5.0)
Alkaline Phosphatase: 162 U/L — ABNORMAL HIGH (ref 38–126)
Anion gap: 9 (ref 5–15)
BUN: 41 mg/dL — ABNORMAL HIGH (ref 8–23)
CO2: 19 mmol/L — ABNORMAL LOW (ref 22–32)
Calcium: 9 mg/dL (ref 8.9–10.3)
Chloride: 107 mmol/L (ref 98–111)
Creatinine, Ser: 1.66 mg/dL — ABNORMAL HIGH (ref 0.44–1.00)
GFR, Estimated: 35 mL/min — ABNORMAL LOW (ref >60)
Glucose, Bld: 192 mg/dL — ABNORMAL HIGH (ref 70–99)
Potassium: 5 mmol/L (ref 3.5–5.1)
Sodium: 135 mmol/L (ref 135–145)
Total Bilirubin: 1 mg/dL (ref 0.3–1.2)
Total Protein: 8.6 g/dL — ABNORMAL HIGH (ref 6.5–8.1)

## 2022-04-15 LAB — I-STAT ARTERIAL BLOOD GAS, ED
Acid-base deficit: 5 mmol/L — ABNORMAL HIGH (ref 0.0–2.0)
Bicarbonate: 21.4 mmol/L (ref 20.0–28.0)
Calcium, Ion: 1.29 mmol/L (ref 1.15–1.40)
HCT: 26 % — ABNORMAL LOW (ref 36.0–46.0)
Hemoglobin: 8.8 g/dL — ABNORMAL LOW (ref 12.0–15.0)
O2 Saturation: 92 %
Potassium: 5.7 mmol/L — ABNORMAL HIGH (ref 3.5–5.1)
Sodium: 135 mmol/L (ref 135–145)
TCO2: 23 mmol/L (ref 22–32)
pCO2 arterial: 46.2 mmHg (ref 32–48)
pH, Arterial: 7.274 — ABNORMAL LOW (ref 7.35–7.45)
pO2, Arterial: 72 mmHg — ABNORMAL LOW (ref 83–108)

## 2022-04-15 LAB — LACTIC ACID, PLASMA
Lactic Acid, Venous: 3.2 mmol/L (ref 0.5–1.9)
Lactic Acid, Venous: 1.7 mmol/L (ref 0.5–1.9)

## 2022-04-15 LAB — URINALYSIS, COMPLETE (UACMP) WITH MICROSCOPIC
Bilirubin Urine: NEGATIVE
Glucose, UA: NEGATIVE mg/dL
Hgb urine dipstick: NEGATIVE
Ketones, ur: NEGATIVE mg/dL
Nitrite: NEGATIVE
Protein, ur: NEGATIVE mg/dL
Specific Gravity, Urine: 1.011 (ref 1.005–1.030)
pH: 6 (ref 5.0–8.0)

## 2022-04-15 LAB — I-STAT VENOUS BLOOD GAS, ED
Acid-base deficit: 8 mmol/L — ABNORMAL HIGH (ref 0.0–2.0)
Bicarbonate: 18.8 mmol/L — ABNORMAL LOW (ref 20.0–28.0)
Calcium, Ion: 1.21 mmol/L (ref 1.15–1.40)
HCT: 29 % — ABNORMAL LOW (ref 36.0–46.0)
Hemoglobin: 9.9 g/dL — ABNORMAL LOW (ref 12.0–15.0)
O2 Saturation: 91 %
Patient temperature: 96.5
Potassium: 5.3 mmol/L — ABNORMAL HIGH (ref 3.5–5.1)
Sodium: 138 mmol/L (ref 135–145)
TCO2: 20 mmol/L — ABNORMAL LOW (ref 22–32)
pCO2, Ven: 42.1 mmHg — ABNORMAL LOW (ref 44–60)
pH, Ven: 7.251 (ref 7.25–7.43)
pO2, Ven: 67 mmHg — ABNORMAL HIGH (ref 32–45)

## 2022-04-15 LAB — RAPID URINE DRUG SCREEN, HOSP PERFORMED
Amphetamines: NOT DETECTED
Barbiturates: NOT DETECTED
Benzodiazepines: NOT DETECTED
Cocaine: NOT DETECTED
Opiates: NOT DETECTED
Tetrahydrocannabinol: NOT DETECTED

## 2022-04-15 LAB — BRAIN NATRIURETIC PEPTIDE: B Natriuretic Peptide: 100.7 pg/mL — ABNORMAL HIGH (ref 0.0–100.0)

## 2022-04-15 LAB — TROPONIN I (HIGH SENSITIVITY)
Troponin I (High Sensitivity): 12 ng/L (ref <18)
Troponin I (High Sensitivity): 27 ng/L — ABNORMAL HIGH (ref <18)

## 2022-04-15 LAB — HEMOGLOBIN A1C
Hgb A1c MFr Bld: 6.8 % — ABNORMAL HIGH (ref 4.8–5.6)
Mean Plasma Glucose: 148.46 mg/dL

## 2022-04-15 LAB — GLUCOSE, CAPILLARY: Glucose-Capillary: 104 mg/dL — ABNORMAL HIGH (ref 70–99)

## 2022-04-15 LAB — PROCALCITONIN: Procalcitonin: 0.15 ng/mL

## 2022-04-15 LAB — RESP PANEL BY RT-PCR (RSV, FLU A&B, COVID)  RVPGX2
Influenza A by PCR: NEGATIVE
Influenza B by PCR: NEGATIVE
Resp Syncytial Virus by PCR: NEGATIVE
SARS Coronavirus 2 by RT PCR: NEGATIVE

## 2022-04-15 LAB — CBG MONITORING, ED
Glucose-Capillary: 197 mg/dL — ABNORMAL HIGH (ref 70–99)
Glucose-Capillary: 127 mg/dL — ABNORMAL HIGH (ref 70–99)
Glucose-Capillary: 136 mg/dL — ABNORMAL HIGH (ref 70–99)

## 2022-04-15 MED ORDER — GABAPENTIN 100 MG PO CAPS
100.0000 mg | ORAL_CAPSULE | Freq: Every day | ORAL | Status: DC
  Administered 2022-04-15 – 2022-04-19 (×5): 100 mg via ORAL
  Filled 2022-04-15 (×5): qty 1

## 2022-04-15 MED ORDER — FUROSEMIDE 10 MG/ML IJ SOLN
40.0000 mg | Freq: Once | INTRAMUSCULAR | Status: AC
  Administered 2022-04-15: 40 mg via INTRAVENOUS
  Filled 2022-04-15: qty 4

## 2022-04-15 MED ORDER — FUROSEMIDE 10 MG/ML IJ SOLN
40.0000 mg | Freq: Two times a day (BID) | INTRAMUSCULAR | Status: DC
  Administered 2022-04-15 – 2022-04-17 (×4): 40 mg via INTRAVENOUS
  Filled 2022-04-15 (×4): qty 4

## 2022-04-15 MED ORDER — CYCLOBENZAPRINE HCL 10 MG PO TABS: 10.0000 mg | ORAL_TABLET | Freq: Two times a day (BID) | ORAL | Status: DC | PRN

## 2022-04-15 MED ORDER — ACETAMINOPHEN 325 MG PO TABS: 650.0000 mg | ORAL_TABLET | Freq: Four times a day (QID) | ORAL | Status: DC | PRN

## 2022-04-15 MED ORDER — CARVEDILOL 3.125 MG PO TABS
3.1250 mg | ORAL_TABLET | Freq: Two times a day (BID) | ORAL | Status: DC
  Administered 2022-04-15 – 2022-04-20 (×10): 3.125 mg via ORAL
  Filled 2022-04-15 (×10): qty 1

## 2022-04-15 MED ORDER — INSULIN ASPART 100 UNIT/ML IJ SOLN
0.0000 [IU] | Freq: Three times a day (TID) | INTRAMUSCULAR | Status: DC
  Administered 2022-04-16: 3 [IU] via SUBCUTANEOUS
  Administered 2022-04-16 – 2022-04-17 (×3): 2 [IU] via SUBCUTANEOUS

## 2022-04-15 MED ORDER — INSULIN DETEMIR 100 UNIT/ML ~~LOC~~ SOLN
12.0000 [IU] | Freq: Every day | SUBCUTANEOUS | Status: DC
  Administered 2022-04-15: 12 [IU] via SUBCUTANEOUS
  Filled 2022-04-15 (×2): qty 0.12

## 2022-04-15 MED ORDER — LEVOFLOXACIN IN D5W 750 MG/150ML IV SOLN
750.0000 mg | Freq: Once | INTRAVENOUS | Status: AC
  Administered 2022-04-15: 750 mg via INTRAVENOUS
  Filled 2022-04-15: qty 150

## 2022-04-15 MED ORDER — MELATONIN 3 MG PO TABS: 3.0000 mg | ORAL_TABLET | Freq: Every evening | ORAL | Status: DC | PRN

## 2022-04-15 MED ORDER — HYDRALAZINE HCL 20 MG/ML IJ SOLN: 5.0000 mg | INTRAMUSCULAR | Status: DC | PRN

## 2022-04-15 MED ORDER — INSULIN DETEMIR 100 UNIT/ML ~~LOC~~ SOLN
20.0000 [IU] | Freq: Every morning | SUBCUTANEOUS | Status: DC
  Administered 2022-04-16 – 2022-04-18 (×3): 20 [IU] via SUBCUTANEOUS
  Filled 2022-04-15 (×3): qty 0.2

## 2022-04-15 MED ORDER — FERROUS SULFATE 325 (65 FE) MG PO TABS
650.0000 mg | ORAL_TABLET | Freq: Every day | ORAL | Status: DC
  Administered 2022-04-16 – 2022-04-20 (×5): 650 mg via ORAL
  Filled 2022-04-15 (×5): qty 2

## 2022-04-15 MED ORDER — ATORVASTATIN CALCIUM 40 MG PO TABS
40.0000 mg | ORAL_TABLET | Freq: Every day | ORAL | Status: DC
  Administered 2022-04-15 – 2022-04-19 (×5): 40 mg via ORAL
  Filled 2022-04-15 (×5): qty 1

## 2022-04-15 MED ORDER — LINAGLIPTIN 5 MG PO TABS
5.0000 mg | ORAL_TABLET | Freq: Every day | ORAL | Status: DC
  Filled 2022-04-15: qty 1

## 2022-04-15 MED ORDER — ACETAMINOPHEN 650 MG RE SUPP: 650.0000 mg | Freq: Four times a day (QID) | RECTAL | Status: DC | PRN

## 2022-04-15 MED ORDER — ASPIRIN 81 MG PO CHEW
81.0000 mg | CHEWABLE_TABLET | Freq: Every day | ORAL | Status: DC
  Administered 2022-04-15 – 2022-04-20 (×5): 81 mg via ORAL
  Filled 2022-04-15 (×6): qty 1

## 2022-04-15 MED ORDER — IOHEXOL 350 MG/ML SOLN
60.0000 mL | Freq: Once | INTRAVENOUS | Status: AC | PRN
  Administered 2022-04-15: 60 mL via INTRAVENOUS

## 2022-04-15 MED ORDER — IOHEXOL 350 MG/ML SOLN: 50.0000 mL | Freq: Once | INTRAVENOUS | Status: DC | PRN

## 2022-04-15 NOTE — ED Triage Notes (Signed)
Per daughter pt makes scents , started haviung shortness of breath  today iin the morning , pt diaphoretic . Per daughter pt was lethargic this morning . Hx diabetes .

## 2022-04-15 NOTE — ED Notes (Signed)
ED Provider at bedside, speaking with daughter

## 2022-04-15 NOTE — ED Notes (Signed)
RRT note late  entry, patient from car cyanotic,dusky, diaphoretic, moaning.  Taken to room 12 placed on 100% NRBM, spO2 after increased to 100%, BBS distant, rales. CO% reading 0.0 from [facility] SpO2/CO portable.

## 2022-04-15 NOTE — H&P (Signed)
History and Physical      Chelsea English [id number] DOB: Mar 28, 1960 DOA: 04/15/2022  PCP: Donnal Debar, PA-C  Patient coming from: home   I have personally briefly reviewed patient's old medical records in [place]  Chief Complaint: Shortness of breath  HPI: Chelsea English is a 62 y.o. female with medical history significant for moderate aortic stenosis, type 2 diabetes mellitus, CKD 3B associated baseline creatinine range 1.4-1.7, anemia of chronic kidney disease, hyperlipidemia, who is admitted to [facility] on 04/15/2022 by way of transfer from [place] emergency department with acute heart failure with preserved EF after presenting from home to the latter facility complaining of shortness of breath.  The patient reports 1 day progressive shortness of breath associated new nonproductive cough.  Denies any associated subjective fever, chills, rigors, generalized myalgias.  He notes slight worsening edema in the bilateral lower extremities.  No associated any hemoptysis, or new calf tenderness.  No assist with any chest pain, palpitations, diaphoresis, nausea, vomiting, dizziness, presyncope, or syncope.  He is a lifelong non-smoker, denies any known chronic underlying pulmonary pathology.  Confirms no known baseline supplemental oxygen requirements.  Per chart review had an echocardiogram performed at outside facility in 2021, which was notable for moderate aortic stenosis, but with preserved EF.  Not on any diuretic medications as an outpatient at this time.  Additionally, per chart review, the most recent set of liver enzymes were noted to be as follows, when checked in June 2023: Alkaline phosphatase 81, AST 34, ALT 51, total bilirubin 0.3.    [facility] ED Course:  Vital signs in the ED were notable for the following: Initial temperature 96.5 F, subsequently increasing to 97.1 Fahrenheit; initial heart rate in the 1 teens, [place]  improving into the 70s following initiation of IV Lasix, as quantified below; systolic blood pressures initially in the 180s, subsequent decreasing into the 140s following initiation of BiPAP as well as IV diuresis; respiratory rate initially 20-28, socially decreasing into the range of 15-20 following initiation of IV diuresis as well as initiation of BiPAP.  Initial oxygen saturations in the mid 80s on room air, prompting initiation of BiPAP, which was subsequently de-escalated to 4 L nasal cannula upon which the patient has been maintaining oxygen saturations of 99 to 100%.  Labs were notable for the following: CMP notable for the following: Sodium 135, potassium 5.0, COVID-19, anion gap 9, creatinine 1.66 compared to most recent prior serum creatinine dated 1.42 on 07/22/2021, glucose 192, alk phos 162, AST 77, ALT 65, total bilirubin 1.0.  High-sensitivity troponin I initially 12, 35 trending up slightly to 27.  BNP 100, without any prior BMP did points available per chart review provide comparison.  CBC notable for will with cell count 92,400 relative demonstrates department with cell count of 15,102 2023, hemoglobin 9.4, unchanged from most recent prior value in June 2023, and associated with normocytic/microphallus was not elevated RDW and platelet count 248.  Initial lactate 3.2, with repeat value noted on 1.7.  Procalcitonin ordered, with result currently pending.  COVID, influenza, RSV PCR were all found to be negative.  Per my interpretation, EKG in ED demonstrated the following: EKG shows sinus tachycardia with heart rate 108, normal intervals, nonspecific T wave inversion in aVL, no evidence of ST changes, including no evidence of ST elevation.  Imaging and additional notable ED work-up: CT chest showed cardiomegaly and suggestive of edema in the bilateral lungs, with small bilateral  pleural effusions, in the absence of overt evidence of infiltrate and no evidence of pneumothorax, also showing  evidence of a 6 mm right upper lobe lung nodule, with corresponding radiology recommendation for follow-up noncontrast CT chest in 6 months.  While in the ED, the following were administered: Baby aspirin x 1, Coreg 3.125 mg p.o., Lasix 40 mg IV x 1, Levaquin 750 mg IV x 1.  Subsequently, the patient was admitted to [facility] for further evaluation management of presenting acutely decompensated heart failure with preserved EF complicated by acute Evoxac respiratory failure, with presenting labs notable for slight interval increase in leukocytosis, acute transaminitis, lactic acidosis, mild elevation in troponin.     Review of Systems: As per HPI otherwise 10 point review of systems negative.   Past Medical History:  Diagnosis Date   Anemia    Aortic stenosis    Coronary artery disease    Diabetes mellitus without complication ([place])    Hyperlipidemia    Hypertension     Past Surgical History:  Procedure Laterality Date   ARTERY BIOPSY Right 07/20/2021   Procedure: BIOPSY OF RIGHT TEMPORAL ARTERY;  Surgeon: Angelia Mould, MD;  Location: [facility] OR;  Service: Vascular;  Laterality: Right;   cataract Right    HYSTEROSCOPY WITH D & C     removal of polyp   MULTIPLE TOOTH EXTRACTIONS     no teeth, no dentures    Social History:  reports that she has never smoked. She has never used smokeless tobacco. She reports that she does not drink alcohol and does not use drugs.   Allergies  Allergen Reactions   Penicillins Anaphylaxis    History reviewed. No pertinent family history.  Family history reviewed and not pertinent    Prior to Admission medications   Medication Sig Start Date End Date Taking? Authorizing Provider  amLODipine (NORVASC) 5 MG tablet Take 5 mg by mouth at bedtime. 07/03/21  Yes [provider]  aspirin 81 MG chewable tablet Chew 81 mg by mouth daily. 07/06/18  Yes [provider]  atorvastatin (LIPITOR) 40 MG tablet Take 40 mg by mouth at  bedtime. 06/01/21  Yes [provider]  BD INSULIN SYRINGE U/F 31G X 5/16" 1 ML MISC Inject 1 each into the skin 3 (three) times daily. Use to inject unsulin 02/07/22  Yes [provider]  benzonatate (TESSALON) 100 MG capsule Take 100 mg by mouth 2 (two) times daily as needed for cough. 07/12/21  Yes [provider]  cyclobenzaprine (FLEXERIL) 5 MG tablet Take 10 mg by mouth 2 (two) times daily as needed for muscle spasms. 07/06/21  Yes [provider]  ferrous sulfate 325 (65 FE) MG tablet Take 650 mg by mouth daily with breakfast.   Yes [provider]  gabapentin (NEURONTIN) 100 MG capsule Take 100 mg by mouth daily. 07/06/21  Yes [provider]  insulin NPH-regular Human (HUMULIN 70/30) (70-30) 100 UNIT/ML injection Inject 40-60 Units into the skin See admin instructions. Injecting 60 units in the morning and 40 units at bedtime. 10/20/17  Yes [provider]  JANUVIA 100 MG tablet Take 100 mg by mouth daily. 07/05/21  Yes [provider]  losartan (COZAAR) 50 MG tablet Take 50 mg by mouth daily. 04/27/21  Yes [provider]  metFORMIN (GLUCOPHAGE) 500 MG tablet Take 500 mg by mouth 2 (two) times daily. 06/04/21  Yes [provider]  Multiple Vitamins-Minerals (MULTI FOR HER 50+ PO) Take 1 tablet  by mouth daily.   Yes [provider]     Objective    Physical Exam: Vitals:   04/15/22 1630 04/15/22 1737 04/15/22 1830 04/15/22 2016  BP: (!) 151/74 (!) 140/92 132/77 (!) 144/65  Pulse: 73 79 79 77  Resp: '13 15 13 20  '$ Temp: (!) 97.1 F (36.2 C)   98.4 F (36.9 C)  TempSrc:    Oral  SpO2: 99% 100% 100% 100%  Weight:        General: appears to be stated age; alert, oriented; mildly increased work of breathing noted Skin: warm, dry, no rash Head:  AT/[place] Mouth:  Oral mucosa membranes appear moist, normal dentition Neck: supple; trachea midline Heart:  RRR; did not appreciate any M/R/G Lungs:  CTAB, did not appreciate any wheezes, rales, or rhonchi Abdomen: + BS; soft, ND, NT Vascular: 2+ pedal pulses b/l; 2+ radial pulses b/l Extremities: Trace edema in the bilateral lower extremities, no muscle wasting Neuro: strength and sensation intact in upper and lower extremities b/l     Labs on Admission: I have personally reviewed following labs and imaging studies  CBC: Recent Labs  Lab 04/15/22 1006 04/15/22 1022 04/15/22 1207  WBC 19.4*  --   --   NEUTROABS 9.1*  --   --   HGB 9.4* 9.9* 8.8*  HCT 30.3* 29.0* 26.0*  MCV 86.3  --   --   PLT 248  --   --    Basic Metabolic Panel: Recent Labs  Lab 04/15/22 1006 04/15/22 1022 04/15/22 1207  NA 135 138 135  K 5.0 5.3* 5.7*  CL 107  --   --   CO2 19*  --   --   GLUCOSE 192*  --   --   BUN 41*  --   --   CREATININE 1.66*  --   --   CALCIUM 9.0  --   --    GFR: CrCl cannot be calculated (Unknown ideal weight.). Liver Function Tests: Recent Labs  Lab 04/15/22 1006  AST 77*  ALT 65*  ALKPHOS 162*  BILITOT 1.0  PROT 8.6*  ALBUMIN 4.2   No results for input(s): "LIPASE", "AMYLASE" in the last 168 hours. No results for input(s): "AMMONIA" in the last 168 hours. Coagulation Profile: No results for input(s): "INR", "PROTIME" in the last 168 hours. Cardiac Enzymes: No results for input(s): "CKTOTAL", "CKMB", "CKMBINDEX", "TROPONINI" in the last 168 hours. BNP (last 3 results) No results for input(s): "PROBNP" in the last 8760 hours. HbA1C: No results for input(s): "HGBA1C" in the last 72 hours. CBG: Recent Labs  Lab 04/15/22 0959 04/15/22 1434 04/15/22 1658 04/15/22 2140  GLUCAP 197* 127* 136* 104*   Lipid Profile: No results for input(s): "CHOL", "HDL", "LDLCALC", "TRIG", "CHOLHDL", "LDLDIRECT" in the last 72 hours. Thyroid Function Tests: No results for input(s): "TSH", "T4TOTAL", "FREET4", "T3FREE", "THYROIDAB" in the last 72 hours. Anemia Panel: No results for input(s): "VITAMINB12", "FOLATE",  "FERRITIN", "TIBC", "IRON", "RETICCTPCT" in the last 72 hours. Urine analysis: No results found for: "COLORURINE", "APPEARANCEUR", "LABSPEC", "PHURINE", "GLUCOSEU", "HGBUR", "BILIRUBINUR", "KETONESUR", "PROTEINUR", "UROBILINOGEN", "NITRITE", "LEUKOCYTESUR"  Radiological Exams on Admission: CT CHEST WO CONTRAST  Result Date: 04/15/2022 CLINICAL DATA:  Severe shortness of breath.  Hypoxia and tachycardia EXAM: CT CHEST WITHOUT CONTRAST TECHNIQUE: Multidetector CT imaging of the chest was performed following the standard protocol without IV contrast. RADIATION DOSE REDUCTION: This exam was performed according to the departmental dose-optimization program which includes automated exposure control, adjustment of the mA  and/or kV according to patient size and/or use of iterative reconstruction technique. COMPARISON:  Chest x-ray earlier 04/15/2022 FINDINGS: Cardiovascular: Heart is slightly enlarged. Trace pericardial fluid. Prominent coronary artery calcifications. There is also significant calcifications along the aortic valve. The thoracic aorta overall otherwise has a normal course and caliber on this noncontrast exam. Significant breathing motion. Mediastinum/Nodes: There are some small but prominent axillary nodes, not pathologic by size criteria. There are several prominent nodes in the mediastinum. Many of which are not pathologically enlarged but more numerous than usually seen. One larger areas seen right paratracheal near the carina level measuring 2.1 by 1.3 cm. Few enlarged subcarinal nodes as well. The thoracic aorta grossly has a normal course and caliber Lungs/Pleura: Trace bilateral pleural effusions. Adjacent parenchymal opacities are identified. Some dependent atelectasis. However there are more diffuse areas ground-glass, vascular congestion, interstitial septal thickening. This appears somewhat confluence in the lower lobes bilaterally. Based on the overall configuration and favor a more edema  rather than infiltrate but infiltrative is still in the differential and would recommend correlate to specific clinical presentation and follow-up. There is also a noncalcified nodule right upper lobe measuring 6 mm on series 4, image 28. Upper Abdomen: Along the upper abdomen the adrenal glands are preserved. Multiple hepatic granulomas are identified. Significant vascular calcifications in the abdomen. Musculoskeletal: Mild degenerative changes seen along the spine. IMPRESSION: Enlarged heart. Bilateral areas of ground-glass opacity, interstitial septal thickening and more confluent opacity along the lung bases with small effusions. Overall favor these areas to represent edema. Component of infiltrate is difficult to completely exclude and recommend follow-up. 6 mm right upper lobe lung nodule which is noncalcified. Non-contrast chest CT at 6 months is recommended. If the nodule is stable at time of repeat CT, then future CT at 18-24 months (from today's scan) is considered optional for low-risk patients, but is recommended for high-risk patients. This recommendation follows the consensus statement: Guidelines for Management of Incidental Pulmonary Nodules Detected on CT Images: From the Fleischner Society 2017; Radiology 2017; 284:228-243. Scattered vascular calcifications including along the coronary arteries and significant calcification along the aortic valve. Aortic Atherosclerosis (ICD10-I70.0). Electronically Signed   By: Jill Side M.D.   On: 04/15/2022 13:08   DG Chest Port 1 View  Result Date: 04/15/2022 CLINICAL DATA:  SOB EXAM: PORTABLE CHEST 1 VIEW COMPARISON:  None Available. FINDINGS: Enlarged cardiac silhouette. Interstitial and alveolar opacities consistent with pulmonary edema. Probable layering pleural effusions. No pneumothorax identified. Calcified aorta. IMPRESSION: Findings suggest CHF. Electronically Signed   By: Sammie Bench M.D.   On: 04/15/2022 10:31      Assessment/Plan    Principal Problem:   Acute diastolic heart failure (HCC) Active Problems:   DM2 (diabetes mellitus, type 2) (HCC)   HLD (hyperlipidemia)   Pulmonary edema   Acute hypoxic respiratory failure (HCC)   SIRS (systemic inflammatory response syndrome) (HCC)   Lactic acidosis   Transaminitis   Elevated troponin   Nodule of upper lobe of right lung   History of anemia due to chronic kidney disease   Stage 3b chronic kidney disease (CKD) ([place])     #) Acutely decompensated heart failure with preserved EF: dx of acute decompensation on the basis of presenting shortness of breath associate with worsening edema in the lower extremities bilaterally, with elevated BNP as well as CT chest showing evidence of pulmonary edema and small bilateral pleural effusions. This is in the context of known known history of chronic diastolic heart failure,  and with report of most recent echocardiogram performed at outside facility in 2021 showing preserved LVEF.   Etiology leading to presenting acutely decompensated heart failure is not entirely clear at this time, although there is concern for interval development of chronic heart failure given cardiomegaly observed on today's CT scan, with increased risk for development of such in the context of previously known at least moderate severity aortic stenosis.  Will pursue updated echocardiogram in the morning to further evaluate.  Of note, not on a diuretic medications as an outpatient.  I suspect that mildly elevated troponin is a consequence of underlying acutely decompensated heart failure as well as as a consequence of diminished oxygen delivery capacity resulting from concomitant acute hypoxic respiratory failure as opposed to representing ACS causing presenting acute heart failure exacerbation, particularly in the absence of any recent CP, and with presenting EKG showing no evidence of acute ischemic changes. However, will continue to evaluate with further trending  of troponin and close monitoring on tele.   Of note, patient received Lasix 40 mg IV x 1 while in the ED today. Presentation warrants additional IV diuresis, as further detailed below, with close monitoring of ensuing renal function, electrolytes, and volume status.  Of note, patient was started on a beta-blocker at [facility] today.   Plan: monitor strict I's & O's and daily weights. Monitor on telemetry. Monitor continuous pulse oximetry. Repeat CMP in the morning, including for monitoring trend of potassium, bicarbonate, and renal function in response to interval diuresis efforts.  Check serum magnesium level.  Close monitoring of ensuing blood pressure response to diuresis efforts, including to help guide need for improvement in afterload reduction in order to optimize cardiac output, while holding home losartan and Norvasc for now to evaluate this interval trend in blood pressure. Trend troponin. Lasix 40 mg IV twice daily.  Echocardiogram ordered for the morning.Marland Kitchen  Urinary drug screen ordered.               #) Acute hypoxic respiratory failure: in the context of acute respiratory symptoms and no known baseline supplemental O2 requirements, presenting O2 sat in the mid 80s on room air, socially improving into the high 90s on initially BiPAP, followed by de-escalation to 4 L nasal cannula, as further quantified above. Appears to be on basis of acutely decompensated heart failure, as further detailed above, with CT chest showing evidence of pulmonary edema, small bilateral pleural effusions, without overt infiltrate to suggest underlying infection and no evidence of pneumothorax.  Will also follow for pending procalcitonin level to further evaluate for any evidence of underlying pneumonia.  No known chronic underlying pulmonary conditions .  Suspect that mildly elevated troponin is on the basis of supply demand mismatch resulting from the decrease in oxygen delivery capacity that  is a/w presenting acute hypoxic respiratory distress in addition to contribution from acute compensated heart failure itself as opposed to representing a type I process due to acute plaque rupture, particularly given presenting ekg that shows no e/o acute ischemic changes, including no evidence of STEMI, and the absence of any reported chest pain a/w this presentation. Overall, ACS is felt to be less likely relative to type 2 supply demand mismatch, as above, but will closely monitor on telemetry overnight while further evaluating and management suspected underlying acutely decompensated heart failure, as above.   In terms of other considered, Clinically, presentation is less suggestive of acute PE at this time, while COVID, influenza, and RSV were all  found to be negative.   Plan: further evaluation/management of presenting acutely decompensated heart failure with preserved EF, as above. Monitor continuous pulse ox with prn supplemental O2 to maintain O2 sats greater than or equal to 92%. monitor on telemetry. CMP/CBC in the AM. Check serum Mg and Phos levels. Check blood gas.  Follow-up result of procalcitonin level.  Trend troponin.  Echocardiogram ordered in the morning.             #) SIRS criteria present: Presentation associated with slight interval increase in white blood cell count, now 19,400 compared to most recent prior value of 15,100 in June 2023, with initial hypothermia, as quantified above.  However, in the absence of e/o underlying infection at this time, including, no overt evidence of infiltrate on CT chest, criteria for sepsis not currently met.  Follow-up result of procalcitonin to further evaluate for any underlying pneumonia, will noting negative COVID, influenza, and RSV PCR, as above.  Suspect non-infectious factors contributing to these SIRS findings, including reactive contribution to interval increase in white blood cell count as consequence of presenting acutely  decompensated heart failure. patient appears hemodynamically stable at this time.  Consequently, will refrain from initiation of IV antibiotics at this time, will noting that she did receive a single dose of IV Levaquin and in the ED prior to transfer.   Plan: Repeat CBC with diff in the AM.  Monitor strict I's and O's and daily weights.  Monitor on telemetry. Refraining from additional IV abx for now, as above.  Check urinalysis.  Follow-up result of procalcitonin level.             #) Acute transaminitis: Mild interval elevation in liver enzymes, without overtly cholestatic pattern.  Suspect that this will elevation may stem from diminished relative hepatic perfusion as consequence of diminished oxygen delivery capacity in the setting of presenting acute hypoxic respiratory failure, as above, with additional potential contribution from relative increase in congestive hepatopathy as a result of presenting acutely decompensated heart failure.  Additionally, given the pulmonary nodule identified on CT chest, also add on GGT level to further correlate mildly elevated alkaline phosphatase level.  Will refrain from dedicated abdominal imaging at this time, but can reconsider this possibility if further interval increase in degree of transaminitis per updated CMP in the morning.  May also consider holding home atorvastatin if ensuing further increase in transaminases as well.  Plan: Repeat CMP in the morning.  Check urinary drug screen, GGT, INR.  Further evaluation management of presenting acutely decompensated heart failure as well as acute Evoxac respiratory failure, as above.          #) Lactic acidosis: Mildly elevated initial lactate 3.2, subsequently decreasing to 1.7.  Suspect that this is as a consequence of relative decline in generalized tissue perfusion as consequence of presenting acute hypoxic respiratory failure in the setting of acutely decompensated heart failure, as above.   No overt evidence of underlying infectious process at this time, and criteria not met for sepsis, as further detailed above.  Also check INR to further evaluate hepatic synthetic function, given slight interval increase in transaminases, as further quantified above.  Plan: Check INR.  Repeat CMP, CBC in the morning.  Check urinalysis, follow-up for procalcitonin level.  Further evaluation management of acute decompensated failure with preserved EF complicated by acute Evoxac respiratory failure, as above.  Monitor strict I's and O's and daily weights.             #)  Right upper lobe lung nodule: Incidental finding on today's CT scan of the chest, measuring 6 mm, with corresponding radiology recommendation for follow-up noncontrast CT scan in 6 months.  Plan: Recommend repeat noncontrast CT chest in 6 months, per associated recommendation from radiology, as above.               #) Type 2 Diabetes Mellitus: documented history of such. Home insulin regimen: 70/30 insulin, 60 units subcu every morning as well as 40 units SQ nightly.  In terms of the basal insulin component of this, this equates to approximately 40 units of basal insulin in the morning and 20 units of basal insulin in the evening.. Home oral hypoglycemic agents: Metformin.  She is also on Januvia as an outpatient.. presenting blood sugar: 197.  in terms of initial dose of basal insulin to be started during this hospitalization, will resume approximately half of outpatient dose in order to reduce risk for ensuing hypoglycemia.   Plan: accuchecks QAC and HS with low dose SSI.  Check hemoglobin A1c level. hold home oral hypoglycemic agents during this hospitalization.  Levemir 20 units every morning as well as Levemir 12 units nightly, as above.             #) CKD Stage 3B: Documented history of such, with baseline creatinine 1.4-1.7, with presenting creatinine consistent with this baseline.  Close monitoring  of ensuing renal function, particular given plan for additional IV diuresis in the context of presenting acutely decompensated heart failure with preserved EF, as above.   Plan: Monitor strict I's and O's and daily weights.  Attempt to avoid nephrotoxic agents.  CMP/magnesium level in the AM.             #) Anemia of chronic kidney disease: Documented history of such, a/w with baseline hgb range 9-10, with presenting hgb consistent with this range, in the absence of any overt evidence of active bleed.     Plan: Repeat CBC in the morning.  Check INR             #) Hyperlipidemia: documented h/o such. On high intensity atorvastatin as outpatient.  Will plan to continue home statin for now, but with consideration for holding if ensuing further increase in degree of transaminitis, as further detailed above.   Plan: continue home statin.  Repeat CMP in the morning.  Check INR.       DVT prophylaxis: SCD's   Code Status: Full code Family Communication: none Disposition Plan: Per Rounding Team Consults called: none;  Admission status: Inpatient     I SPENT GREATER THAN 75  MINUTES IN CLINICAL CARE TIME/MEDICAL DECISION-MAKING IN COMPLETING THIS ADMISSION.      [place] DO [facility]  From [place]   04/15/2022, 10:08 PM

## 2022-04-15 NOTE — ED Notes (Signed)
RT removed Bipap at this time. Daughter updated at this time

## 2022-04-15 NOTE — ED Provider Notes (Signed)
[facility] Provider Note   CSN: [id number] Arrival date & time: 04/15/22  [id number]     History  Chief Complaint  Patient presents with   Shortness of Breath    Chelsea English is a 62 y.o. female, history of diabetes, hypertension, who presents to the ED secondary to severe shortness of breath starting this morning.  She arrived via car, and appeared dusky, diaphoretic and was moaning.  History was limited given patient's status.  History obtained from daughter at bedside, she states that the patient was making some incense, using Sandalwood scents, and some other stirring it over the stove, with sugar, late last night.  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  States that she woke up this morning became short of breath, and had difficulty going to the car when the daughter picked her up.  Shortness of breath progressively got worse, and she was moaning on arrival to the ER.     Home Medications Prior to Admission medications   Medication Sig Start Date End Date Taking? Authorizing Provider  amLODipine (NORVASC) 5 MG tablet Take 5 mg by mouth at bedtime. 07/03/21   [provider]  aspirin 81 MG chewable tablet Chew 81 mg by mouth daily. 07/06/18   [provider]  atorvastatin (LIPITOR) 40 MG tablet Take 40 mg by mouth at bedtime. 06/01/21   [provider]  benzonatate (TESSALON) 100 MG capsule Take 100 mg by mouth 2 (two) times daily as needed for cough. 07/12/21   [provider]  cyclobenzaprine (FLEXERIL) 5 MG tablet Take 10 mg by mouth 2 (two) times daily as needed for muscle spasms. 07/06/21   [provider]  ferrous sulfate 325 (65 FE) MG tablet Take 650 mg by mouth daily with breakfast.    [provider]  gabapentin (NEURONTIN) 100 MG capsule Take 100-300 mg by mouth at bedtime. 07/06/21   [provider]  insulin NPH-regular Human (HUMULIN  70/30) (70-30) 100 UNIT/ML injection Inject 40-60 Units into the skin See admin instructions. Injecting 60 units in the AM and 40 units at bedtime. 10/20/17   [provider]  JANUVIA 100 MG tablet Take 100 mg by mouth daily. 07/05/21   [provider]  losartan (COZAAR) 50 MG tablet Take 50 mg by mouth daily. 04/27/21   [provider]  metFORMIN (GLUCOPHAGE) 500 MG tablet Take 500 mg by mouth 2 (two) times daily. 06/04/21   [provider]  Multiple Vitamins-Minerals (MULTI FOR HER 50+ PO) Take 1 tablet by mouth daily.    [provider]      Allergies    Penicillins    Review of Systems   Review of Systems  Constitutional:  Negative for fever.  Respiratory:  Positive for shortness of breath.     Physical Exam Updated Vital Signs BP (!) 156/95   Pulse 92   Temp (!) 96.5 F (35.8 C) (Rectal)   Resp 20   Wt 90.7 kg   SpO2 100%   BMI 35.43 kg/m  Physical Exam Constitutional:      Appearance: She is obese. She is ill-appearing.  HENT:     Head: Normocephalic.  Eyes:     Extraocular Movements: Extraocular movements intact.     Pupils: Pupils are equal, round, and reactive to light.  Neck:     Comments: +edematous, no subq air noted. Cardiovascular:     Rate  and Rhythm: Regular rhythm. Tachycardia present.  Pulmonary:     Effort: Tachypnea present.     Breath sounds: Examination of the right-upper field reveals rales. Examination of the left-upper field reveals rales. Rales present.  Abdominal:     Palpations: Abdomen is soft.  Skin:    Capillary Refill: Capillary refill takes 2 to 3 seconds.     Coloration: Skin is cyanotic.  Neurological:     Comments: lethargic  Psychiatric:        Mood and Affect: Mood is anxious.     ED Results / Procedures / Treatments   Labs (all labs ordered are listed, but only abnormal results are displayed) Labs Reviewed  CBC WITH DIFFERENTIAL/PLATELET - Abnormal; Notable for the following  components:      Result Value   WBC 19.4 (*)    RBC 3.51 (*)    Hemoglobin 9.4 (*)    HCT 30.3 (*)    Neutro Abs 9.1 (*)    Lymphs Abs 8.3 (*)    Monocytes Absolute 1.1 (*)    Abs Immature Granulocytes 0.12 (*)    All other components within normal limits  COMPREHENSIVE METABOLIC PANEL - Abnormal; Notable for the following components:   CO2 19 (*)    Glucose, Bld 192 (*)    BUN 41 (*)    Creatinine, Ser 1.66 (*)    Total Protein 8.6 (*)    AST 77 (*)    ALT 65 (*)    Alkaline Phosphatase 162 (*)    GFR, Estimated 35 (*)    All other components within normal limits  BRAIN NATRIURETIC PEPTIDE - Abnormal; Notable for the following components:   B Natriuretic Peptide 100.7 (*)    All other components within normal limits  LACTIC ACID, PLASMA - Abnormal; Notable for the following components:   Lactic Acid, Venous 3.2 (*)    All other components within normal limits  I-STAT VENOUS BLOOD GAS, ED - Abnormal; Notable for the following components:   pCO2, Ven 42.1 (*)    pO2, Ven 67 (*)    Bicarbonate 18.8 (*)    TCO2 20 (*)    Acid-base deficit 8.0 (*)    Potassium 5.3 (*)    HCT 29.0 (*)    Hemoglobin 9.9 (*)    All other components within normal limits  CBG MONITORING, ED - Abnormal; Notable for the following components:   Glucose-Capillary 197 (*)    All other components within normal limits  I-STAT ARTERIAL BLOOD GAS, ED - Abnormal; Notable for the following components:   pH, Arterial 7.274 (*)    pO2, Arterial 72 (*)    Acid-base deficit 5.0 (*)    Potassium 5.7 (*)    HCT 26.0 (*)    Hemoglobin 8.8 (*)    All other components within normal limits  TROPONIN I (HIGH SENSITIVITY) - Abnormal; Notable for the following components:   Troponin I (High Sensitivity) 27 (*)    All other components within normal limits  RESP PANEL BY RT-PCR (RSV, FLU A&B, COVID)  RVPGX2  LACTIC ACID, PLASMA  COOXEMETRY PANEL  PROCALCITONIN  HEMOGLOBIN A1C  TROPONIN I (HIGH SENSITIVITY)     EKG EKG Interpretation  Date/Time:  Friday April 15 2022 10:04:34 EST Ventricular Rate:  108 PR Interval:  200 QRS Duration: 97 QT Interval:  334 QTC Calculation: 448 R Axis:   -8 Text Interpretation: Sinus tachycardia Low voltage, precordial leads Borderline repolarization abnormality No significant change since last tracing  Confirmed by Leanord Asal ([id number]) on 04/15/2022 10:06:14 AM  Radiology CT CHEST WO CONTRAST  Result Date: 04/15/2022 CLINICAL DATA:  Severe shortness of breath.  Hypoxia and tachycardia EXAM: CT CHEST WITHOUT CONTRAST TECHNIQUE: Multidetector CT imaging of the chest was performed following the standard protocol without IV contrast. RADIATION DOSE REDUCTION: This exam was performed according to the departmental dose-optimization program which includes automated exposure control, adjustment of the mA and/or kV according to patient size and/or use of iterative reconstruction technique. COMPARISON:  Chest x-ray earlier 04/15/2022 FINDINGS: Cardiovascular: Heart is slightly enlarged. Trace pericardial fluid. Prominent coronary artery calcifications. There is also significant calcifications along the aortic valve. The thoracic aorta overall otherwise has a normal course and caliber on this noncontrast exam. Significant breathing motion. Mediastinum/Nodes: There are some Zerah Hilyer but prominent axillary nodes, not pathologic by size criteria. There are several prominent nodes in the mediastinum. Many of which are not pathologically enlarged but more numerous than usually seen. One larger areas seen right paratracheal near the carina level measuring 2.1 by 1.3 cm. Few enlarged subcarinal nodes as well. The thoracic aorta grossly has a normal course and caliber Lungs/Pleura: Trace bilateral pleural effusions. Adjacent parenchymal opacities are identified. Some dependent atelectasis. However there are more diffuse areas ground-glass, vascular congestion, interstitial septal  thickening. This appears somewhat confluence in the lower lobes bilaterally. Based on the overall configuration and favor a more edema rather than infiltrate but infiltrative is still in the differential and would recommend correlate to specific clinical presentation and follow-up. There is also a noncalcified nodule right upper lobe measuring 6 mm on series 4, image 28. Upper Abdomen: Along the upper abdomen the adrenal glands are preserved. Multiple hepatic granulomas are identified. Significant vascular calcifications in the abdomen. Musculoskeletal: Mild degenerative changes seen along the spine. IMPRESSION: Enlarged heart. Bilateral areas of ground-glass opacity, interstitial septal thickening and more confluent opacity along the lung bases with Ranell Finelli effusions. Overall favor these areas to represent edema. Component of infiltrate is difficult to completely exclude and recommend follow-up. 6 mm right upper lobe lung nodule which is noncalcified. Non-contrast chest CT at 6 months is recommended. If the nodule is stable at time of repeat CT, then future CT at 18-24 months (from today's scan) is considered optional for low-risk patients, but is recommended for high-risk patients. This recommendation follows the consensus statement: Guidelines for Management of Incidental Pulmonary Nodules Detected on CT Images: From the Fleischner Society 2017; Radiology 2017; 284:228-243. Scattered vascular calcifications including along the coronary arteries and significant calcification along the aortic valve. Aortic Atherosclerosis (ICD10-I70.0). Electronically Signed   By: Jill Side M.D.   On: 04/15/2022 13:08   DG Chest Port 1 View  Result Date: 04/15/2022 CLINICAL DATA:  SOB EXAM: PORTABLE CHEST 1 VIEW COMPARISON:  None Available. FINDINGS: Enlarged cardiac silhouette. Interstitial and alveolar opacities consistent with pulmonary edema. Probable layering pleural effusions. No pneumothorax identified. Calcified aorta.  IMPRESSION: Findings suggest CHF. Electronically Signed   By: Sammie Bench M.D.   On: 04/15/2022 10:31    Procedures Procedures    Medications Ordered in ED Medications  aspirin chewable tablet 81 mg (has no administration in time range)  atorvastatin (LIPITOR) tablet 40 mg (has no administration in time range)  carvedilol (COREG) tablet 3.125 mg (has no administration in time range)  hydrALAZINE (APRESOLINE) injection 5 mg (has no administration in time range)  furosemide (LASIX) injection 40 mg (has no administration in time range)  linagliptin (TRADJENTA) tablet 5 mg (has no  administration in time range)  ferrous sulfate tablet 650 mg (has no administration in time range)  cyclobenzaprine (FLEXERIL) tablet 10 mg (has no administration in time range)  gabapentin (NEURONTIN) capsule 100-300 mg (has no administration in time range)  insulin aspart (novoLOG) injection 0-9 Units (has no administration in time range)  levofloxacin (LEVAQUIN) IVPB 750 mg (0 mg Intravenous Stopped 04/15/22 1312)  furosemide (LASIX) injection 40 mg (40 mg Intravenous Given 04/15/22 1042)  iohexol (OMNIPAQUE) 350 MG/ML injection 60 mL (60 mLs Intravenous Contrast Given 04/15/22 1243)    ED Course/ Medical Decision Making/ A&P                             Medical Decision Making Patient is a 62 year old female, here for shortness of breath that started this morning after making perfumes last night.  Upon entry to the ER she was dusky appearing, diaphoretic, and complaining of shortness of breath moaning.  O2 initially was 45%, however her hands were very cold.  She was placed on a nonrebreather, and O2 went to 100%.  We will obtain a chest x-ray, CTA PE given her shortness of breath, and acute onset, along with tachycardia.  VBG, carboxyhemoglobin, troponin, EKG ordered.  Amount and/or Complexity of Data Reviewed Labs: ordered.    Details: WBC 19k+, Elevated BNP, VBG fine. Initial lactate elevated, 2nd  WNL. Radiology: ordered.    Details: Initial chest x-ray shows pulmonary edema, chest CT shows pulmonary edema as well as well as a nodule. ECG/medicine tests:  Decision-making details documented in ED Course. Discussion of management or test interpretation with external provider(s): Patient is a 62 year old female, here for shortness of breath that is acute on onset, states that was not previously short of breath.  She appeared cyanotic, and was placed on a nonrebreather, and then transition to BiPAP.  Chest x-ray shows acute pulmonary edema.  CTA PE study initially ordered, however CT tech did not bolus correctly, thus CT noncontrast obtained.  I discussed with hospitalist, on this was not obtained, admitted to Dr. Roosevelt Locks, hospitalist service, PCU, given respiratory distress,.  Concern for acute heart failure secondary to to chemical inhalation, discussed with poison control, toxicologist believes that this may be more chronic onset, Dr. Roosevelt Locks agrees with this.  Admitted for further evaluation. Currently stable on BiPAP  Risk Prescription drug management. Decision regarding hospitalization.   Final Clinical Impression(s) / ED Diagnoses Final diagnoses:  Pulmonary edema caused by chemical fumes ([place])  Hypoxia  Acute respiratory distress  Leukocytosis, unspecified type    Rx / DC Orders ED Discharge Orders     None         Brianne Maina Carlean Jews, PA 04/15/22 1346    Leanord Asal K, DO 04/15/22 1431

## 2022-04-15 NOTE — ED Notes (Signed)
Arterial blood sample from right artery for ABG & Carboxyhgb.  Courier at lab for transport to [facility] lab.

## 2022-04-15 NOTE — ED Notes (Signed)
Co-oxemetry panel obtained by RT and couriered to the lab.  called and spoke to charge Lab received specimen 2 minutes too late to process lab. EPD notified. Can recollect once pt gets to [facility]

## 2022-04-15 NOTE — ED Notes (Signed)
Recliner in room for family members comfort

## 2022-04-15 NOTE — ED Notes (Signed)
Off BiPAP, BBS much improved, on 6LPM [place], Spo2 100%.  RT to closely monitor.

## 2022-04-15 NOTE — ED Notes (Signed)
Lab/call placed to courier for stat Cgas/carboyhgb sample

## 2022-04-15 NOTE — ED Notes (Signed)
Called [facility] for transport talked to [place] at 3:27

## 2022-04-16 ENCOUNTER — Inpatient Hospital Stay (HOSPITAL_COMMUNITY): Payer: Medicaid Other

## 2022-04-16 DIAGNOSIS — I5031 Acute diastolic (congestive) heart failure: Secondary | ICD-10-CM

## 2022-04-16 DIAGNOSIS — E669 Obesity, unspecified: Secondary | ICD-10-CM

## 2022-04-16 DIAGNOSIS — I10 Essential (primary) hypertension: Secondary | ICD-10-CM | POA: Diagnosis not present

## 2022-04-16 DIAGNOSIS — N1832 Chronic kidney disease, stage 3b: Secondary | ICD-10-CM | POA: Diagnosis not present

## 2022-04-16 DIAGNOSIS — E785 Hyperlipidemia, unspecified: Secondary | ICD-10-CM

## 2022-04-16 DIAGNOSIS — I5033 Acute on chronic diastolic (congestive) heart failure: Secondary | ICD-10-CM

## 2022-04-16 DIAGNOSIS — E1169 Type 2 diabetes mellitus with other specified complication: Secondary | ICD-10-CM | POA: Diagnosis present

## 2022-04-16 LAB — CBC WITH DIFFERENTIAL/PLATELET
Abs Immature Granulocytes: 0.04 10*3/uL (ref 0.00–0.07)
Basophils Absolute: 0 10*3/uL (ref 0.0–0.1)
Basophils Relative: 0 %
Eosinophils Absolute: 0.1 10*3/uL (ref 0.0–0.5)
Eosinophils Relative: 1 %
HCT: 25.9 % — ABNORMAL LOW (ref 36.0–46.0)
Hemoglobin: 8.6 g/dL — ABNORMAL LOW (ref 12.0–15.0)
Immature Granulocytes: 0 %
Lymphocytes Relative: 18 %
Lymphs Abs: 2 10*3/uL (ref 0.7–4.0)
MCH: 27.6 pg (ref 26.0–34.0)
MCHC: 33.2 g/dL (ref 30.0–36.0)
MCV: 83 fL (ref 80.0–100.0)
Monocytes Absolute: 0.6 10*3/uL (ref 0.1–1.0)
Monocytes Relative: 6 %
Neutro Abs: 8.3 10*3/uL — ABNORMAL HIGH (ref 1.7–7.7)
Neutrophils Relative %: 75 %
Platelets: 209 10*3/uL (ref 150–400)
RBC: 3.12 MIL/uL — ABNORMAL LOW (ref 3.87–5.11)
RDW: 13.9 % (ref 11.5–15.5)
WBC: 11.2 10*3/uL — ABNORMAL HIGH (ref 4.0–10.5)
nRBC: 0 % (ref 0.0–0.2)

## 2022-04-16 LAB — COMPREHENSIVE METABOLIC PANEL
ALT: 55 U/L — ABNORMAL HIGH (ref 0–44)
AST: 36 U/L (ref 15–41)
Albumin: 3.6 g/dL (ref 3.5–5.0)
Alkaline Phosphatase: 122 U/L (ref 38–126)
Anion gap: 11 (ref 5–15)
BUN: 39 mg/dL — ABNORMAL HIGH (ref 8–23)
CO2: 23 mmol/L (ref 22–32)
Calcium: 9.3 mg/dL (ref 8.9–10.3)
Chloride: 103 mmol/L (ref 98–111)
Creatinine, Ser: 1.75 mg/dL — ABNORMAL HIGH (ref 0.44–1.00)
GFR, Estimated: 33 mL/min — ABNORMAL LOW (ref >60)
Glucose, Bld: 224 mg/dL — ABNORMAL HIGH (ref 70–99)
Potassium: 4.9 mmol/L (ref 3.5–5.1)
Sodium: 137 mmol/L (ref 135–145)
Total Bilirubin: 1.1 mg/dL (ref 0.3–1.2)
Total Protein: 7.3 g/dL (ref 6.5–8.1)

## 2022-04-16 LAB — ECHOCARDIOGRAM COMPLETE
AR max vel: 0.51 cm2
AV Area VTI: 0.48 cm2
AV Area mean vel: 0.5 cm2
AV Mean grad: 47 mmHg
AV Peak grad: 78.9 mmHg
Ao pk vel: 4.44 m/s
Area-P 1/2: 3.65 cm2
Height: 63 in
S' Lateral: 3.4 cm
Weight: 3224.01 oz

## 2022-04-16 LAB — GLUCOSE, CAPILLARY
Glucose-Capillary: 157 mg/dL — ABNORMAL HIGH (ref 70–99)
Glucose-Capillary: 221 mg/dL — ABNORMAL HIGH (ref 70–99)
Glucose-Capillary: 181 mg/dL — ABNORMAL HIGH (ref 70–99)
Glucose-Capillary: 158 mg/dL — ABNORMAL HIGH (ref 70–99)

## 2022-04-16 LAB — BLOOD GAS, VENOUS
Acid-Base Excess: 1 mmol/L (ref 0.0–2.0)
Bicarbonate: 27.1 mmol/L (ref 20.0–28.0)
O2 Saturation: 78.4 %
Patient temperature: 36.9
pCO2, Ven: 48 mmHg (ref 44–60)
pH, Ven: 7.36 (ref 7.25–7.43)
pO2, Ven: 45 mmHg (ref 32–45)

## 2022-04-16 LAB — CARBOXYHEMOGLOBIN - COOX: Carboxyhemoglobin: 1.5 % (ref 0.5–1.5)

## 2022-04-16 LAB — PROTIME-INR
INR: 1.2 (ref 0.8–1.2)
Prothrombin Time: 15.1 seconds (ref 11.4–15.2)

## 2022-04-16 LAB — TROPONIN I (HIGH SENSITIVITY): Troponin I (High Sensitivity): 68 ng/L — ABNORMAL HIGH (ref <18)

## 2022-04-16 LAB — MAGNESIUM: Magnesium: 2 mg/dL (ref 1.7–2.4)

## 2022-04-16 LAB — PHOSPHORUS: Phosphorus: 4 mg/dL (ref 2.5–4.6)

## 2022-04-16 LAB — GAMMA GT: GGT: 35 U/L (ref 7–50)

## 2022-04-16 NOTE — Progress Notes (Signed)
  Echocardiogram 2D Echocardiogram has been performed.  Chelsea English 04/16/2022, 4:04 PM

## 2022-04-16 NOTE — Assessment & Plan Note (Addendum)
Echocardiogram with preserved LV systolic function, LV EF 60 to 65%, moderate LVH, RV systolic function preserved, RVSP 29,0, LA with mild dilatation, severe aortic valve stenosis.   Urine output is 900  ml Systolic blood pressure 116 to 140 mmHg.   Continue with carvedilol.  Limited pharmacologic options due to reduced GFR.  Continue to hold on diuretic therapy for now. Plan for cardiac catheterization today, work up for possible TAVR.   Troponin elevation due to heart failure exacerbation, ruled out for acute coronary syndrome.  Lactic acidosis due to heart failure exacerbation.   Acute hypoxemic respiratory failure due to acute cardiogenic pulmonary edema.  Oxymetry has improved post diuresis, her 02 saturation today is 95% on room air.    Follow PT and OT. Out of bed to chair tid with meals.

## 2022-04-16 NOTE — Assessment & Plan Note (Signed)
Calculated BMI is 35.69

## 2022-04-16 NOTE — Assessment & Plan Note (Addendum)
Continue blood pressure monitoring Continue with furosemide IV and oral carvedilol.

## 2022-04-16 NOTE — Hospital Course (Addendum)
Chelsea English was admitted to the hospital with the working diagnosis of decompensated heart failure.   62 yo female with the past medical history of moderate aortic stenosis, T2DM, CKD stage 3b and dyslipidemia who presented with dyspnea. Reported 24 hrs of progressive dyspnea with cough, along with lower extremity edema. Per her daughter patient was noted to have dyspnea on exertion for the last 2 days. On her initial physical examination patient was in respiratory distress and was placed on Bipap, blood pressure systolic [id number],  HR 73, RR 28  and 02 saturation 99%, lungs with no wheezing or rhonchi, heart with S1 and S2 present and tachycardic, abdomen with no distention and positive lower extremity edema.   VBG 7.25/ 42/ 67/ 18 and 91%  Na 135, K 5,0 Cl 107 bicarbonate 19 glucose 192 bun 41 cr 1,66  High sensitive troponin 12  BNP 100.7  Lactic acid 3,2 and 1,7  Wbc 19, hgb 9,4 plt 248   Sars covid 19 negative   Chest radiograph with cardiomegaly, bilateral hilar vascular congestion with bilateral interstitial infiltrates with cephalization of the vasculature, bilateral pleural effusions more right than left.   CT chest with bilateral ground glass opacities, with bilateral small pleural effusions.  6 mm nodule right upper lung nodule non calcified. Non contrast CT at 6 months is recommended.   EKG 108 bpm, left axis deviation, normal intervals, sinus rhythm with no significant ST segment or T wave changes.   02/25 patient has responded well to medical therapy and has achieved a euvolemic state.  Echocardiogram with severe aortic stenosis. Plan to consult cardiology structural/ valvular team.

## 2022-04-16 NOTE — Assessment & Plan Note (Signed)
Renal function with serum cr at 1.75 with K at 4,9 and serum bicarbonate at 23.  Na 137 and Mg 2.0   Continue diuresis with furosemide and follow up renal function in am.  Avoid hypotension and nephrotoxic medications.

## 2022-04-16 NOTE — Assessment & Plan Note (Addendum)
T2DM with uncontrolled hyperglycemia.   Fasting glucose is 327 Basal insulin 20 units.  Will increase dose of insulin sliding scale.   Hold on metformin for now.   Continue statin therapy.

## 2022-04-16 NOTE — Progress Notes (Signed)
Progress Note   Patient: Chelsea English [id number] DOB: 1960-05-15 DOA: 04/15/2022     1 DOS: the patient was seen and examined on 04/16/2022   Brief hospital course: Chelsea English was admitted to the hospital with the working diagnosis of decompensated heart failure.   62 yo female with the past medical history of moderate aortic stenosis, T2DM, CKD stage 3b and dyslipidemia who presented with dyspnea. Reported 24 hrs of progressive dyspnea with cough, along with lower extremity edema. Per her daughter patient was noted to have dyspnea on exertion for the last 2 days. On her initial physical examination patient was in respiratory distress and was placed on Bipap, blood pressure systolic [id number],  HR 73, RR 28  and 02 saturation 99%, lungs with no wheezing or rhonchi, heart with S1 and S2 present and tachycardic, abdomen with no distention and positive lower extremity edema.   VBG 7.25/ 42/ 67/ 18 and 91%  Na 135, K 5,0 Cl 107 bicarbonate 19 glucose 192 bun 41 cr 1,66  High sensitive troponin 12  BNP 100.7  Lactic acid 3,2 and 1,7  Wbc 19, hgb 9,4 plt 248   Sars covid 19 negative   Chest radiograph with cardiomegaly, bilateral hilar vascular congestion with bilateral interstitial infiltrates with cephalization of the vasculature, bilateral pleural effusions more right than left.   CT chest with bilateral ground glass opacities, with bilateral small pleural effusions.  6 mm nodule right upper lung nodule non calcified. Non contrast CT at 6 months is recommended.   EKG 108 bpm, left axis deviation, normal intervals, sinus rhythm with no significant ST segment or T wave changes.     Assessment and Plan: * Acute on chronic diastolic CHF (congestive heart failure) (HCC) Follow up echocardiogram to assess aortic valve.   Volume status has improved but not back to baseline.  Urine output is [id number] ml Systolic blood pressure is 128 to 135 mmHg.   Plan to continue diuresis with furosemide  IV  Continue with carvedilol.  Limited pharmacologic options due to reduced GFR.   Troponin elevation due to heart failure exacerbation, ruled out for acute coronary syndrome.  Lactic acidosis due to heart failure exacerbation.   Acute hypoxemic respiratory failure due to acute cardiogenic pulmonary edema.  Oxymetry has improved post diuresis, her 02 saturation today is 100% on 4 L/min per [place].   Essential hypertension Continue blood pressure monitoring Continue with furosemide IV and oral carvedilol.   Stage 3b chronic kidney disease (CKD) ([place]) Renal function with serum cr at 1.75 with K at 4,9 and serum bicarbonate at 23.  Na 137 and Mg 2.0   Continue diuresis with furosemide and follow up renal function in am.  Avoid hypotension and nephrotoxic medications.   Type 2 diabetes mellitus with hyperlipidemia (HCC) T2DM with uncontrolled hyperglycemia.   Fasting glucose is 224. Plan to continue insulin sliding scale for glucose cover and monitoring.  Patient is tolerating po well.  Hold on metformin for now.   Continue statin therapy.   Class 2 obesity Calculated BMI is 35.69        Subjective: Patient with no chest pain, dyspnea has been improving, along with lower extremity edema.  Physical Exam: Vitals:   04/16/22 0046 04/16/22 0553 04/16/22 0815 04/16/22 1127  BP: (!) 131/59 (!) 137/56 128/72 135/76  Pulse: 77 79 76 71  Resp:  '18 18 18  '$ Temp: 98.3 F (36.8 C) 98.7 F (37.1 C) 98.5 F (36.9 C) 98.6 F (37 C)  TempSrc: Oral Oral Oral Oral  SpO2: 100% 100% 100% 100%  Weight:  91.4 kg    Height:       Neurology awake and alert ENT With no pallor or icterus Cardiovasculatory with S1 and S2 present with no gallops, or rubs, positive systolic murmur at the base  No JVD (wide neck) Respiratory with mild rales bilaterally with no wheezing Abdomen with no distention  Trace lower extremity edema  Data Reviewed:    Family Communication: I spoke with patient's  daughter at the bedside, we talked in detail about patient's condition, plan of care and prognosis and all questions were addressed.   Disposition: Status is: Inpatient Remains inpatient appropriate because: heart failure   Planned Discharge Destination: Home      Author: Tawni Millers, MD 04/16/2022 1:54 PM  For on call review [email or web address].

## 2022-04-17 DIAGNOSIS — E669 Obesity, unspecified: Secondary | ICD-10-CM | POA: Diagnosis not present

## 2022-04-17 DIAGNOSIS — I10 Essential (primary) hypertension: Secondary | ICD-10-CM | POA: Diagnosis not present

## 2022-04-17 DIAGNOSIS — I5033 Acute on chronic diastolic (congestive) heart failure: Secondary | ICD-10-CM | POA: Diagnosis not present

## 2022-04-17 DIAGNOSIS — N1832 Chronic kidney disease, stage 3b: Secondary | ICD-10-CM | POA: Diagnosis not present

## 2022-04-17 LAB — BASIC METABOLIC PANEL
Anion gap: 9 (ref 5–15)
BUN: 48 mg/dL — ABNORMAL HIGH (ref 8–23)
CO2: 27 mmol/L (ref 22–32)
Calcium: 9.4 mg/dL (ref 8.9–10.3)
Chloride: 97 mmol/L — ABNORMAL LOW (ref 98–111)
Creatinine, Ser: 1.9 mg/dL — ABNORMAL HIGH (ref 0.44–1.00)
GFR, Estimated: 29 mL/min — ABNORMAL LOW (ref >60)
Glucose, Bld: 327 mg/dL — ABNORMAL HIGH (ref 70–99)
Potassium: 4.6 mmol/L (ref 3.5–5.1)
Sodium: 133 mmol/L — ABNORMAL LOW (ref 135–145)

## 2022-04-17 LAB — GLUCOSE, CAPILLARY
Glucose-Capillary: 184 mg/dL — ABNORMAL HIGH (ref 70–99)
Glucose-Capillary: 301 mg/dL — ABNORMAL HIGH (ref 70–99)
Glucose-Capillary: 181 mg/dL — ABNORMAL HIGH (ref 70–99)
Glucose-Capillary: 197 mg/dL — ABNORMAL HIGH (ref 70–99)

## 2022-04-17 LAB — MAGNESIUM: Magnesium: 1.9 mg/dL (ref 1.7–2.4)

## 2022-04-17 MED ORDER — INSULIN ASPART 100 UNIT/ML IJ SOLN: 0.0000 [IU] | Freq: Three times a day (TID) | INTRAMUSCULAR | Status: DC

## 2022-04-17 MED ORDER — INSULIN ASPART 100 UNIT/ML IJ SOLN
0.0000 [IU] | Freq: Three times a day (TID) | INTRAMUSCULAR | Status: DC
  Administered 2022-04-17: 3 [IU] via SUBCUTANEOUS
  Administered 2022-04-17: 11 [IU] via SUBCUTANEOUS
  Administered 2022-04-18: 5 [IU] via SUBCUTANEOUS
  Administered 2022-04-18: 8 [IU] via SUBCUTANEOUS
  Administered 2022-04-18 – 2022-04-20 (×6): 5 [IU] via SUBCUTANEOUS

## 2022-04-17 NOTE — Progress Notes (Addendum)
Progress Note   Patient: Chelsea English [id number] DOB: Apr 13, 1960 DOA: 04/15/2022     2 DOS: the patient was seen and examined on 04/17/2022   Brief hospital course: Mrs. Kegler was admitted to the hospital with the working diagnosis of decompensated heart failure.   62 yo female with the past medical history of moderate aortic stenosis, T2DM, CKD stage 3b and dyslipidemia who presented with dyspnea. Reported 24 hrs of progressive dyspnea with cough, along with lower extremity edema. Per her daughter patient was noted to have dyspnea on exertion for the last 2 days. On her initial physical examination patient was in respiratory distress and was placed on Bipap, blood pressure systolic 99991111,  HR 73, RR 28  and 02 saturation 99%, lungs with no wheezing or rhonchi, heart with S1 and S2 present and tachycardic, abdomen with no distention and positive lower extremity edema.   VBG 7.25/ 42/ 67/ 18 and 91%  Na 135, K 5,0 Cl 107 bicarbonate 19 glucose 192 bun 41 cr 1,66  High sensitive troponin 12  BNP 100.7  Lactic acid 3,2 and 1,7  Wbc 19, hgb 9,4 plt 248   Sars covid 19 negative   Chest radiograph with cardiomegaly, bilateral hilar vascular congestion with bilateral interstitial infiltrates with cephalization of the vasculature, bilateral pleural effusions more right than left.   CT chest with bilateral ground glass opacities, with bilateral small pleural effusions.  6 mm nodule right upper lung nodule non calcified. Non contrast CT at 6 months is recommended.   EKG 108 bpm, left axis deviation, normal intervals, sinus rhythm with no significant ST segment or T wave changes.   02/25 patient has responded well to medical therapy and has achieved a euvolemic state.  Echocardiogram with severe aortic stenosis. Plan to consult cardiology structural/ valvular team.   Assessment and Plan: * Acute on chronic diastolic CHF (congestive heart failure) (HCC) Echocardiogram with preserved LV  systolic function, LV EF 60 to 65%, moderate LVH, RV systolic function preserved, RVSP 29,0, LA with mild dilatation, severe aortic valve stenosis.   Urine output is 99991111 ml Systolic blood pressure is 140 to 135 mmHg.   Continue with carvedilol.  Limited pharmacologic options due to reduced GFR.  Plan to consult cardiology structural/ valvular team tomorrow, patient may qualify for TAVR.   Troponin elevation due to heart failure exacerbation, ruled out for acute coronary syndrome.  Lactic acidosis due to heart failure exacerbation.   Acute hypoxemic respiratory failure due to acute cardiogenic pulmonary edema.  Oxymetry has improved post diuresis, her 02 saturation today is 95% on room air.    Consult PT and OT. Out of bed to chair tid with meals.   Essential hypertension Continue blood pressure control with carvedilol. Hold on diuretic therapy for now.  Continue blood pressure monitoring.   Stage 3b chronic kidney disease (CKD) ([place]) AKI   Renal function with serum cr at 1,90 with K at 4,6 and serum bicarbonate at 27. Na 133 (glucose is 327), and Mg is 1,9   Plan to hold on diuretic therapy for now and follow up renal function in am. Avoid hypotension or nephrotoxic medications.    Anemia of chronic renal disease, cell count has been stable.   Type 2 diabetes mellitus with hyperlipidemia (HCC) T2DM with uncontrolled hyperglycemia.   Fasting glucose is 327 Basal insulin 20 units.  Will increase dose of insulin sliding scale.   Hold on metformin for now.   Continue statin therapy.   Class 2  obesity Calculated BMI is 35.69        Subjective: Patient is feeling better, her dyspnea and edema have resolved, no chest pain. Information from patient's daughter as interpreter.   Physical Exam: Vitals:   04/17/22 0429 04/17/22 0431 04/17/22 0935 04/17/22 1007  BP: 135/69  134/65   Pulse: 71  77 79  Resp: 18  19   Temp: 97.8 F (36.6 C)  97.9 F (36.6 C)    TempSrc: Oral  Oral   SpO2: 100%  95%   Weight:  88.8 kg    Height:       Neurology awake and alert ENT with no pallor Cardiovascular with S1 and S2 present with positive systolic murmur at the base with no gallops, rubs or murmurs No JVD No lower extremity edema Respiratory with no rales or wheezing, no rhonchi Abdomen with no distention  Data Reviewed:    Family Communication: I spoke with patient's daughter at the bedside, we talked in detail about patient's condition, plan of care and prognosis and all questions were addressed.   Disposition: Status is: Inpatient Remains inpatient appropriate because: heart failure, new severe aortic stenosis   Planned Discharge Destination: Home  Author: Tawni Millers, MD 04/17/2022 11:38 AM  For on call review [email or web address].

## 2022-04-18 DIAGNOSIS — E1169 Type 2 diabetes mellitus with other specified complication: Secondary | ICD-10-CM | POA: Diagnosis not present

## 2022-04-18 DIAGNOSIS — I5033 Acute on chronic diastolic (congestive) heart failure: Secondary | ICD-10-CM | POA: Diagnosis not present

## 2022-04-18 DIAGNOSIS — I10 Essential (primary) hypertension: Secondary | ICD-10-CM | POA: Diagnosis not present

## 2022-04-18 DIAGNOSIS — N1832 Chronic kidney disease, stage 3b: Secondary | ICD-10-CM | POA: Diagnosis not present

## 2022-04-18 LAB — BASIC METABOLIC PANEL
Anion gap: 14 (ref 5–15)
BUN: 69 mg/dL — ABNORMAL HIGH (ref 8–23)
CO2: 26 mmol/L (ref 22–32)
Calcium: 9.4 mg/dL (ref 8.9–10.3)
Chloride: 94 mmol/L — ABNORMAL LOW (ref 98–111)
Creatinine, Ser: 2.05 mg/dL — ABNORMAL HIGH (ref 0.44–1.00)
GFR, Estimated: 27 mL/min — ABNORMAL LOW (ref >60)
Glucose, Bld: 322 mg/dL — ABNORMAL HIGH (ref 70–99)
Potassium: 4.4 mmol/L (ref 3.5–5.1)
Sodium: 132 mmol/L — ABNORMAL LOW (ref 135–145)

## 2022-04-18 LAB — GLUCOSE, CAPILLARY
Glucose-Capillary: 257 mg/dL — ABNORMAL HIGH (ref 70–99)
Glucose-Capillary: 216 mg/dL — ABNORMAL HIGH (ref 70–99)
Glucose-Capillary: 214 mg/dL — ABNORMAL HIGH (ref 70–99)
Glucose-Capillary: 227 mg/dL — ABNORMAL HIGH (ref 70–99)

## 2022-04-18 MED ORDER — INSULIN DETEMIR 100 UNIT/ML ~~LOC~~ SOLN
20.0000 [IU] | Freq: Two times a day (BID) | SUBCUTANEOUS | Status: DC
  Administered 2022-04-18: 20 [IU] via SUBCUTANEOUS
  Filled 2022-04-18 (×3): qty 0.2

## 2022-04-18 NOTE — Consult Note (Signed)
CARDIOLOGY CONSULT NOTE  Patient ID: Chelsea English MRN: [id number] DOB/AGE: 27-Dec-1960 62 y.o.  Admit date: 04/15/2022 Referring Physician: [facility] hospitalist Reason for Consultation: Severe aortic stenosis  HPI:   62 year old [place] female with hypertension, hyperlipidemia, type 2 diabetes mellitus, diabetic retinopathy w. left eye blindness, chronic kidney disease, severe aortic stenosis, acute on chronic HFpEF  Patient speaks Arabic.  Patient's daughter Neysa Bonito is a Ship broker working at [employer], who assisted with language interpretation.  Patient lives in [place] with her other daughter.  At baseline, she is fairly functional and independent, in spite of her left eye blindness.  Recently, she has noticed shortness of breath, more so when she bends down and does more than usual physical activity at home.  She also had bilateral leg swelling.  This led to hospitalization on 04/15/2022.  While hospitalized, she had at least 1 episode of pain under her left breast, radiating to her back, lasted for about 1 hour, resolved with some NSAID.  She has not had any recurrent chest pain, at least at rest.  Workup this hospitalization showed severe aortic stenosis on echocardiogram, details below.  BNP and high-sensitivity troponin were mildly elevated.  Creatinine on admission was 1.66, now elevated to 2.05.  She is -6 L for the hospital stay.   Patient last had an echocardiogram in [facility] in 2021 which reportedly had showed moderate aortic stenosis.  Patient has never seen a cardiologist before.  At present, she denies any chest pain, shortness of breath.  Leg edema is resolved.   Past Medical History:  Diagnosis Date   Anemia    Aortic stenosis    Coronary artery disease    Diabetes mellitus without complication ([place])    Hyperlipidemia    Hypertension      Past Surgical History:  Procedure Laterality Date   ARTERY BIOPSY Right 07/20/2021   Procedure: BIOPSY OF  RIGHT TEMPORAL ARTERY;  Surgeon: Angelia Mould, MD;  Location: [facility] OR;  Service: Vascular;  Laterality: Right;   cataract Right    HYSTEROSCOPY WITH D & C     removal of polyp   MULTIPLE TOOTH EXTRACTIONS     no teeth, no dentures     History reviewed. No pertinent family history.   Social History: Social History   Socioeconomic History   Marital status: Widowed    Spouse name: Not on file   Number of children: Not on file   Years of education: Not on file   Highest education level: Not on file  Occupational History   Not on file  Tobacco Use   Smoking status: Never   Smokeless tobacco: Never  Vaping Use   Vaping Use: Never used  Substance and Sexual Activity   Alcohol use: Never   Drug use: Never   Sexual activity: Not Currently    Birth control/protection: Post-menopausal  Other Topics Concern   Not on file  Social History Narrative   Not on file   Social Determinants of Health   Financial Resource Strain: Not on file  Food Insecurity: No Food Insecurity (04/15/2022)   Hunger Vital Sign    Worried About Running Out of Food in the Last Year: Never true    Ran Out of Food in the Last Year: Never true  Transportation Needs: No Transportation Needs (04/15/2022)   PRAPARE - Transportation    Lack of Transportation (Medical): No    Lack of Transportation (Non-Medical): No  Physical Activity: Not on file  Stress: Not on file  Social Connections: Not on file  Intimate Partner Violence: Not At Risk (04/15/2022)   Humiliation, Afraid, Rape, and Kick questionnaire    Fear of Current or Ex-Partner: No    Emotionally Abused: No    Physically Abused: No    Sexually Abused: No     Medications Prior to Admission  Medication Sig Dispense Refill Last Dose   amLODipine (NORVASC) 5 MG tablet Take 5 mg by mouth at bedtime.   04/14/2022   aspirin 81 MG chewable tablet Chew 81 mg by mouth daily.   04/14/2022   atorvastatin (LIPITOR) 40 MG tablet Take 40 mg by mouth at  bedtime.   04/14/2022   BD INSULIN SYRINGE U/F 31G X 5/16" 1 ML MISC Inject 1 each into the skin 3 (three) times daily. Use to inject unsulin      benzonatate (TESSALON) 100 MG capsule Take 100 mg by mouth 2 (two) times daily as needed for cough.   04/15/2022   cyclobenzaprine (FLEXERIL) 5 MG tablet Take 10 mg by mouth 2 (two) times daily as needed for muscle spasms.   04/14/2022   ferrous sulfate 325 (65 FE) MG tablet Take 650 mg by mouth daily with breakfast.   04/14/2022   gabapentin (NEURONTIN) 100 MG capsule Take 100 mg by mouth daily.   04/14/2022   insulin NPH-regular Human (HUMULIN 70/30) (70-30) 100 UNIT/ML injection Inject 40-60 Units into the skin See admin instructions. Injecting 60 units in the morning and 40 units at bedtime.   04/14/2022   JANUVIA 100 MG tablet Take 100 mg by mouth daily.   04/14/2022   losartan (COZAAR) 50 MG tablet Take 50 mg by mouth daily.   04/14/2022   metFORMIN (GLUCOPHAGE) 500 MG tablet Take 500 mg by mouth 2 (two) times daily.   04/14/2022   Multiple Vitamins-Minerals (MULTI FOR HER 50+ PO) Take 1 tablet by mouth daily.   04/14/2022    Review of Systems  Cardiovascular:  Positive for chest pain (1 episode as per HPI, currently absent.), dyspnea on exertion (Present on admission.  Currently absent, at least at rest.) and leg swelling (Present on admission, now resolved). Negative for palpitations and syncope.      Physical Exam: Physical Exam Vitals and nursing note reviewed.  Constitutional:      General: She is not in acute distress. Neck:     Vascular: No JVD.  Cardiovascular:     Rate and Rhythm: Normal rate and regular rhythm.     Pulses:          Carotid pulses are  on the right side with bruit and  on the left side with bruit.    Heart sounds: Murmur heard.     Harsh midsystolic murmur is present with a grade of 4/6 at the upper right sternal border radiating to the neck.  Pulmonary:     Effort: Pulmonary effort is normal.     Breath sounds:  Normal breath sounds. No wheezing or rales.  Musculoskeletal:     Right lower leg: No edema.     Left lower leg: No edema.        Imaging/tests reviewed and independently interpreted: Lab Results: CBC, BMP, Trop HS, BNP  CT Chest 04/15/2022: Enlarged heart. Bilateral areas of ground-glass opacity, interstitial septal thickening and more confluent opacity along the lung bases with small effusions. Overall favor these areas to represent edema. Component of infiltrate is difficult to completely exclude and recommend follow-up.  6 mm right upper lobe lung nodule which is noncalcified. Non-contrast chest CT at 6 months is recommended. If the nodule is stable at time of repeat CT, then future CT at 18-24 months (from today's scan) is considered optional for low-risk patients, but is recommended for high-risk patients. This recommendation follows the consensus statement: Guidelines for Management of Incidental Pulmonary Nodules Detected on CT Images: From the Fleischner Society 2017; Radiology 2017; 284:228-243.   Scattered vascular calcifications including along the coronary arteries and significant calcification along the aortic valve.   Aortic Atherosclerosis  Cardiac Studies:  Telemetry 04/18/2022: No significant arrhythmia  EKG 04/15/2022: Sinus tachycardia Low voltage, precordial leads Borderline repolarization abnormality  Echocardiogram 04/16/2022:  1. The aortic valve is tricuspid. There is severe calcifcation of the  aortic valve. There is severe thickening of the aortic valve. Aortic valve  regurgitation is trivial. Severe aortic valve stenosis with AVA 0.71cm2,  Aortic valve mean gradient measures   47.0 mmHg. Aortic valve Vmax measures 4.44 m/s. DI 0.23.   2. Left ventricular ejection fraction, by estimation, is 60 to 65%. The  left ventricle has normal function. The left ventricle has no regional  wall motion abnormalities. There is moderate concentric left  ventricular  hypertrophy. Left ventricular  diastolic parameters are consistent with Grade I diastolic dysfunction  (impaired relaxation).   3. Right ventricular systolic function is normal. The right ventricular  size is normal. There is normal pulmonary artery systolic pressure. The  estimated right ventricular systolic pressure is 0000000 mmHg.   4. Left atrial size was mildly dilated.   5. The mitral valve is degenerative. Trivial mitral valve regurgitation.   6. The inferior vena cava is normal in size with greater than 50%  respiratory variability, suggesting right atrial pressure of 3 mmHg.   Comparison(s): No prior Echocardiogram.     Assessment & Recommendations:  62 year old [place] female with hypertension, hyperlipidemia, type 2 diabetes mellitus, diabetic retinopathy w. left eye blindness, chronic kidney disease, severe aortic stenosis, acute on chronic HFpEF  HFpEF, severe aortic stenosis: Valvular cardiomyopathy with preserved EF.  Acute on chronic presentation currently resolved. Severe aortic stenosis on echocardiogram.  Mildly elevated BNP and troponin, with at least 1 episode of chest pain. Risk factors for CAD including hypertension, type 2 diabetes mellitus. Currently euvolemic after aggressive diuresis, coinciding with mild elevated creatinine, acute kidney injury.  Patient will need workup for aortic valve replacement, TAVR versus SAVR depending on coronary anatomy.  Ideally, would like to perform right heart catheterization and coronary angiography this hospitalization, followed by outpatient CTA and other workup as per structural valve clinic.  I have encouraged her to increase oral hydration and intake today with hopes of improvement in renal function, as close to baseline of 1.6 as possible.  If renal function improves, I will perform coronary angiography and right heart catheterization tomorrow morning.  Risks, benefits, alternate options discussed with the  patient and her daughter in detail.  Continue aspirin, statin, carvedilol, management of diabetes. Hold any diuresis for now.  Discussed interpretation of tests and management recommendations with the primary team     [name], MD Pager: [phone] Office: [phone]

## 2022-04-18 NOTE — Progress Notes (Signed)
Progress Note   Patient: Chelsea English [id number] DOB: 1960/05/11 DOA: 04/15/2022     3 DOS: the patient was seen and examined on 04/18/2022   Brief hospital course: Mrs. Schlepp was admitted to the hospital with the working diagnosis of decompensated heart failure.   62 yo female with the past medical history of moderate aortic stenosis, T2DM, CKD stage 3b and dyslipidemia who presented with dyspnea. Reported 24 hrs of progressive dyspnea with cough, along with lower extremity edema. Per her daughter patient was noted to have dyspnea on exertion for the last 2 days. On her initial physical examination patient was in respiratory distress and was placed on Bipap, blood pressure systolic 99991111,  HR 73, RR 28  and 02 saturation 99%, lungs with no wheezing or rhonchi, heart with S1 and S2 present and tachycardic, abdomen with no distention and positive lower extremity edema.   VBG 7.25/ 42/ 67/ 18 and 91%  Na 135, K 5,0 Cl 107 bicarbonate 19 glucose 192 bun 41 cr 1,66  High sensitive troponin 12  BNP 100.7  Lactic acid 3,2 and 1,7  Wbc 19, hgb 9,4 plt 248   Sars covid 19 negative   Chest radiograph with cardiomegaly, bilateral hilar vascular congestion with bilateral interstitial infiltrates with cephalization of the vasculature, bilateral pleural effusions more right than left.   CT chest with bilateral ground glass opacities, with bilateral small pleural effusions.  6 mm nodule right upper lung nodule non calcified. Non contrast CT at 6 months is recommended.   EKG 108 bpm, left axis deviation, normal intervals, sinus rhythm with no significant ST segment or T wave changes.   02/25 patient has responded well to medical therapy and has achieved a euvolemic state.  Echocardiogram with severe aortic stenosis.  02/26 consulted cardiology plan for cardiac catheterization and start work up for possible TAVR.   Assessment and Plan: * Acute on chronic diastolic CHF (congestive heart failure)  (HCC) Echocardiogram with preserved LV systolic function, LV EF 60 to 65%, moderate LVH, RV systolic function preserved, RVSP 29,0, LA with mild dilatation, severe aortic valve stenosis.   Urine output is [id number] ml Systolic blood pressure [id number] to 150 mmHg.   Continue with carvedilol.  Limited pharmacologic options due to reduced GFR.  Continue to hold on diuretic therapy for now. Plan for cardiac catheterization tomorrow per cardiology team.    Troponin elevation due to heart failure exacerbation, ruled out for acute coronary syndrome.  Lactic acidosis due to heart failure exacerbation.   Acute hypoxemic respiratory failure due to acute cardiogenic pulmonary edema.  Oxymetry has improved post diuresis, her 02 saturation today is 95% on room air.    Follow PT and OT. Out of bed to chair tid with meals.   Essential hypertension Continue blood pressure control with carvedilol. Hold on diuretic therapy for now.  Continue blood pressure monitoring.   Stage 3b chronic kidney disease (CKD) (HCC) AKI   Renal function with serum cr at 2,0 with K at 4,4 and serum bicarbonate at 26. Na is 132   Continue to hold on diuresis and follow up renal function in am.  Avoid hypotension and nephrotoxic medications.   Anemia of chronic renal disease, cell count has been stable.   Type 2 diabetes mellitus with hyperlipidemia (HCC) T2DM with uncontrolled hyperglycemia.   Capillary glucose 197, 257 and 216  Fasting glucose is 322 Basal insulin 20 units.  Now patient on higher dose of insulin sliding scale.  Will calculate requirements before  adjusting basal insulin.   Hold on metformin for now.   Continue statin therapy.   Class 2 obesity Calculated BMI is 35.69        Subjective: Patient is feeling better, no dyspnea or edema, no chest pain. Has been out of bed to the chair.  Daughter at the bedside for interpreting.   Physical Exam: Vitals:   04/17/22 1647 04/17/22 2030 04/18/22  0040 04/18/22 0415  BP:  137/70 137/67 114/69  Pulse: 74 81 85   Resp:      Temp:  97.9 F (36.6 C) 97.9 F (36.6 C) 98.1 F (36.7 C)  TempSrc:  Oral Oral Oral  SpO2:  99%    Weight:   88.8 kg   Height:       Neurology awake and alert ENT with mild pallor Cardiovascular with S1 and S2 present and rhythmic with positive murmur at the base with no rubs or gallops No JVD No lower extremity edema Respiratory with no rales or wheezing, no rhonchi Abdomen with no distention  Data Reviewed:    Family Communication: I spoke with patient's daughter at the bedside, we talked in detail about patient's condition, plan of care and prognosis and all questions were addressed.   Disposition: Status is: Inpatient Remains inpatient appropriate because: cardiac catheterization, work up for aortic stenosis.   Planned Discharge Destination: Home    Author: Tawni Millers, MD 04/18/2022 8:36 AM  For on call review [email or web address].

## 2022-04-18 NOTE — Evaluation (Addendum)
Physical Therapy Evaluation Patient Details Name: Chelsea English MRN: [id number] DOB: 05-26-1960 Today's Date: 04/18/2022  History of Present Illness  Pt is a 62 y/o F presenting to ED on 2/23 with SOB and worsening BLE edema. CT chest with bilateral small pleural effusions. Admitted for acute on chronic CHF. PMH includes DM, HTN, moderate aortic stenosis, DM2, CKD IIIB  Clinical Impression  Pt admitted with above diagnosis. Pt able to progress ambulation in hallway with min guard assist and cues. Pt with slow pace but steady gait without challenges. Pt may benefit from rollator so she can rest at times.  Will need to be able to ascend and descend flight of stairs.  To practice this tomorrow.  Pt currently with functional limitations due to the deficits listed below (see PT Problem List). Pt will benefit from skilled PT to increase their independence and safety with mobility to allow discharge to the venue listed below.          Recommendations for follow up therapy are one component of a multi-disciplinary discharge planning process, led by the attending physician.  Recommendations may be updated based on patient status, additional functional criteria and insurance authorization.  Follow Up Recommendations Home health PT      Assistance Recommended at Discharge Intermittent Supervision/Assistance  Patient can return home with the following  A little help with walking and/or transfers;A little help with bathing/dressing/bathroom;Assistance with cooking/housework;Assist for transportation;Help with stairs or ramp for entrance    Equipment Recommendations Rollator (4 wheels)  Recommendations for Other Services       Functional Status Assessment Patient has had a recent decline in their functional status and demonstrates the ability to make significant improvements in function in a reasonable and predictable amount of time.     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) Restrictions Weight Bearing Restrictions: No      Mobility  Bed Mobility               General bed mobility comments: in chair on arrival    Transfers Overall transfer level: Needs assistance Equipment used: 1 person hand held assist Transfers: Sit to/from Stand Sit to Stand: Min assist, Min guard           General transfer comment: min A initially, fading to min guard    Ambulation/Gait Ambulation/Gait assistance: Min guard Gait Distance (Feet): 150 Feet Assistive device: 1 person hand held assist, None Gait Pattern/deviations: Step-through pattern, Decreased stride length, Wide base of support, Decreased step length - right, Decreased step length - left   Gait velocity interpretation: <1.31 ft/sec, indicative of household ambulator   General Gait Details: Pt with slow and cautious gait with pt steady as she takes her time. Pt ambulated to bathroom and was able to urinate and then walked into hallway.  Desaturate to 89% on RA but could improve breathing with pursed lip breathing. Family states pt is not at her baseline.  Stairs            Wheelchair Mobility    Modified Rankin (Stroke Patients Only)       Balance Overall balance assessment: Needs assistance Sitting-balance support: Feet supported Sitting balance-Leahy Scale: Good     Standing balance support: During functional activity Standing balance-Leahy Scale: Fair Standing balance comment: intermittently reaching out for external support  Pertinent Vitals/Pain Pain Assessment Pain Assessment: No/denies pain    Home Living Family/patient expects to be discharged to:: Private residence Living Arrangements: Children Available Help at Discharge: Family;Available 24 hours/day Type of Home: House Home Access: Level entry     Alternate Level Stairs-Number of Steps: flight Home Layout: Two level;Bed/bath  upstairs Home Equipment: None      Prior Function Prior Level of Function : Independent/Modified Independent             Mobility Comments: ind without AD ADLs Comments: assist for LB ADLs and transportation, ind with IADLs and does med mgmt for herself and her son     Hand Dominance   Dominant Hand: Right    Extremity/Trunk Assessment   Upper Extremity Assessment Upper Extremity Assessment: Defer to OT evaluation    Lower Extremity Assessment Lower Extremity Assessment: Generalized weakness    Cervical / Trunk Assessment Cervical / Trunk Assessment: Normal  Communication   Communication: Prefers language other than [place];Interpreter utilized (family present to interpret)  Cognition Arousal/Alertness: Awake/alert Behavior During Therapy: WFL for tasks assessed/performed Overall Cognitive Status: Within Functional Limits for tasks assessed                                          General Comments General comments (skin integrity, edema, etc.): HR WFL, desaturate to 89% on RA but improves with pursed lip breathing.    Exercises General Exercises - Lower Extremity Ankle Circles/Pumps: AROM, Both, 20 reps, Supine   Assessment/Plan    PT Assessment Patient needs continued PT services  PT Problem List Decreased activity tolerance;Decreased balance;Decreased mobility;Decreased knowledge of use of DME;Decreased knowledge of precautions;Decreased safety awareness;Cardiopulmonary status limiting activity       PT Treatment Interventions DME instruction;Gait training;Functional mobility training;Stair training;Therapeutic activities;Therapeutic exercise;Balance training;Patient/family education    PT Goals (Current goals can be found in the Care Plan section)  Acute Rehab PT Goals Patient Stated Goal: to go home PT Goal Formulation: With patient Time For Goal Achievement: 05/02/22 Potential to Achieve Goals: Good    Frequency Min 3X/week      Co-evaluation               AM-PAC PT "6 Clicks" Mobility  Outcome Measure Help needed turning from your back to your side while in a flat bed without using bedrails?: A Little Help needed moving from lying on your back to sitting on the side of a flat bed without using bedrails?: A Little Help needed moving to and from a bed to a chair (including a wheelchair)?: A Little Help needed standing up from a chair using your arms (e.g., wheelchair or bedside chair)?: A Little Help needed to walk in hospital room?: A Little Help needed climbing 3-5 steps with a railing? : A Little 6 Click Score: 18    End of Session Equipment Utilized During Treatment: Gait belt Activity Tolerance: Patient limited by fatigue Patient left: in chair;with call bell/phone within reach;with family/visitor present Nurse Communication: Mobility status PT Visit Diagnosis: Muscle weakness (generalized) (M62.81)    Time: 1020-1036 PT Time Calculation (min) (ACUTE ONLY): 16 min   Charges:   PT Evaluation $PT Eval Moderate Complexity: 1 Mod          Gerome Kokesh M,PT Acute Rehab Services [phone]   Alvira Philips 04/18/2022, 1:57 PM

## 2022-04-18 NOTE — Evaluation (Signed)
Occupational Therapy Evaluation Patient Details Name: Chelsea English MRN: [id number] DOB: September 23, 1960 Today's Date: 04/18/2022   History of Present Illness Pt is a 62 y/o F presenting to ED on 2/23 with SOB and worsening BLE edema. CT chest with bilateral small pleural effusions. Admitted for acute on chronic CHF. PMH includes DM, HTN, moderate aortic stenosis, DM2, CKD IIIB   Clinical Impression   Pt reports needing assist at baseline for LB ADLs and transportation, ind with IADLs including med mgmt, cooking, and cleaning around the house, lives with children in 2 story home. Pt currently needing min guard-mod A for ADLs, supervision for bed mobility, and min guard-min A for transfers without AD. VSS on RA throughout session and pt reporting she feels "weaker" moving around compared to baseline. Pt presenting with impairments listed below, will follow acutely. Recommend HHOT at d/c pending progression (may progress to no OT follow up needs).      Recommendations for follow up therapy are one component of a multi-disciplinary discharge planning process, led by the attending physician.  Recommendations may be updated based on patient status, additional functional criteria and insurance authorization.   Follow Up Recommendations  Home health OT     Assistance Recommended at Discharge Intermittent Supervision/Assistance  Patient can return home with the following A little help with bathing/dressing/bathroom;Assistance with cooking/housework;Help with stairs or ramp for entrance;Assist for transportation    Functional Status Assessment  Patient has had a recent decline in their functional status and demonstrates the ability to make significant improvements in function in a reasonable and predictable amount of time.  Equipment Recommendations  BSC/3in1    Recommendations for Other Services PT consult     Precautions / Restrictions Precautions Precautions: Other (comment) (L eye blind at  baseline) Restrictions Weight Bearing Restrictions: No      Mobility Bed Mobility Overal bed mobility: Needs Assistance Bed Mobility: Supine to Sit     Supine to sit: Supervision          Transfers Overall transfer level: Needs assistance Equipment used: 1 person hand held assist Transfers: Sit to/from Stand Sit to Stand: Min assist, Min guard           General transfer comment: min A initially, fading to min guard      Balance Overall balance assessment: Needs assistance Sitting-balance support: Feet supported Sitting balance-Leahy Scale: Good     Standing balance support: During functional activity Standing balance-Leahy Scale: Fair Standing balance comment: intermittently reaching out for external support                           ADL either performed or assessed with clinical judgement   ADL Overall ADL's : Needs assistance/impaired Eating/Feeding: Modified independent   Grooming: Min guard;Standing;Wash/dry hands   Upper Body Bathing: Min guard   Lower Body Bathing: Minimal assistance   Upper Body Dressing : Min guard   Lower Body Dressing: Moderate assistance Lower Body Dressing Details (indicate cue type and reason): to don socks and mesh underwear Toilet Transfer: Min guard;Ambulation;Regular Museum/gallery exhibitions officer and Hygiene: Min guard;Sit to/from stand Toileting - Clothing Manipulation Details (indicate cue type and reason): for pericare in standing     Functional mobility during ADLs: Min guard;Minimal assistance       Vision   Additional Comments: 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     Perception Perception Perception Tested?: No   Portis tested?: Not tested  Pertinent Vitals/Pain Pain Assessment Pain Assessment: No/denies pain     Hand Dominance Right   Extremity/Trunk Assessment Upper Extremity Assessment Upper Extremity Assessment: Generalized weakness   Lower Extremity  Assessment Lower Extremity Assessment: Defer to PT evaluation   Cervical / Trunk Assessment Cervical / Trunk Assessment: Normal   Communication Communication Communication: Prefers language other than [place];Interpreter utilized (family present to interpret)   Cognition Arousal/Alertness: Awake/alert Behavior During Therapy: WFL for tasks assessed/performed Overall Cognitive Status: Within Functional Limits for tasks assessed                                       General Comments  VSS on RA    Exercises     Shoulder Instructions      Home Living Family/patient expects to be discharged to:: Private residence Living Arrangements: Children Available Help at Discharge: Family;Available 24 hours/day Type of Home: House Home Access: Level entry     Home Layout: Two level;Bed/bath upstairs Alternate Level Stairs-Number of Steps: flight   Bathroom Shower/Tub: Tub/shower unit         Home Equipment: None          Prior Functioning/Environment Prior Level of Function : Independent/Modified Independent             Mobility Comments: ind without AD ADLs Comments: assist for LB ADLs and transportation, ind with IADLs and does med mgmt for herself and her son        OT Problem List: Decreased range of motion;Decreased activity tolerance;Decreased strength;Impaired balance (sitting and/or standing)      OT Treatment/Interventions: Self-care/ADL training;Therapeutic exercise;DME and/or AE instruction;Energy conservation;Therapeutic activities;Patient/family education;Balance training    OT Goals(Current goals can be found in the care plan section) Acute Rehab OT Goals Patient Stated Goal: none stated OT Goal Formulation: With patient Time For Goal Achievement: 05/02/22 Potential to Achieve Goals: Good ADL Goals Pt Will Perform Upper Body Dressing: with modified independence;sitting Pt Will Perform Lower Body Dressing: with modified  independence;sitting/lateral leans;sit to/from stand Pt Will Transfer to Toilet: with modified independence;ambulating;bedside commode;regular height toilet Additional ADL Goal #1: Pt will verbalize 3 energy conservation strategies in prep for ADLs  OT Frequency: Min 2X/week    Co-evaluation              AM-PAC OT "6 Clicks" Daily Activity     Outcome Measure Help from another person eating meals?: None Help from another person taking care of personal grooming?: A Little Help from another person toileting, which includes using toliet, bedpan, or urinal?: A Little Help from another person bathing (including washing, rinsing, drying)?: A Lot Help from another person to put on and taking off regular upper body clothing?: A Little Help from another person to put on and taking off regular lower body clothing?: A Lot 6 Click Score: 17   End of Session Equipment Utilized During Treatment: Gait belt Nurse Communication: Mobility status  Activity Tolerance: Patient tolerated treatment well Patient left: in chair;with call bell/phone within reach;with family/visitor present  OT Visit Diagnosis: Unsteadiness on feet (R26.81);Other abnormalities of gait and mobility (R26.89);Muscle weakness (generalized) (M62.81)                Time: [id number] OT Time Calculation (min): 21 min Charges:  OT General Charges $OT Visit: 1 Visit OT Evaluation $OT Eval Moderate Complexity: 1 Mod  Elbert Spickler K, OTD, OTR/L SecureChat Preferred Acute Rehab [phone]  [place] 04/18/2022, 9:05 AM

## 2022-04-18 NOTE — TOC Progression Note (Signed)
Transition of Care [facility]) - Progression Note    Patient Details  Name: Chelsea English MRN: [id number] Date of Birth: 1960/12/19  Transition of Care [facility]) CM/SW Contact  Zenon Mayo, RN Phone Number: 04/18/2022, 1:42 PM  Clinical Narrative:     from home, HF, await pt eval.  AS, CHF, holding lasix for kidney function.  TOC following.         Expected Discharge Plan and Services                                               Social Determinants of Health (SDOH) Interventions SDOH Screenings   Food Insecurity: No Food Insecurity (04/15/2022)  Housing: Low Risk  (04/15/2022)  Transportation Needs: No Transportation Needs (04/15/2022)  Utilities: Not At Risk (04/15/2022)  Tobacco Use: Low Risk  (04/15/2022)    Readmission Risk Interventions     No data to display

## 2022-04-18 NOTE — Progress Notes (Signed)
Inpatient Diabetes Program Recommendations  AACE/ADA: New Consensus Statement on Inpatient Glycemic Control (2015)  Target Ranges:  Prepandial:   less than 140 mg/dL      Peak postprandial:   less than 180 mg/dL (1-2 hours)      Critically ill patients:  140 - 180 mg/dL   Lab Results  Component Value Date   GLUCAP 216 (H) 04/18/2022   HGBA1C 6.8 (H) 04/15/2022    Review of Glycemic Control  Latest Reference Range & Units 04/17/22 11:31 04/17/22 15:59 04/17/22 21:04 04/18/22 06:11 04/18/22 11:25  Glucose-Capillary 70 - 99 mg/dL 301 (H) 181 (H) 197 (H) 257 (H) 216 (H)   Diabetes history: DM 2 Outpatient Diabetes medications:  Humulin 70/30- 60 units in the AM and 40 units in the PM Januvia 100 mg daily Metformin 500 mg bid Current orders for Inpatient glycemic control:  Novolog 0-15 units tid with meals Levemir 20 units q AM  Inpatient Diabetes Program Recommendations:    Note patient was taking 70/30 regimen at home (total 100 units daily).  May consider increasing Levemir to 20 units bid. May also need meal coverage eventually?   Thanks,  Adah Perl, RN, BC-ADM Inpatient Diabetes Coordinator Pager [phone]

## 2022-04-18 NOTE — Progress Notes (Signed)
Mobility Specialist - Progress Note   04/18/22 1647  Mobility  Activity Ambulated with assistance in hallway  Level of Assistance Standby assist, set-up cues, supervision of patient - no hands on  Assistive Device Four wheel walker  Distance Ambulated (ft) 300 ft  Activity Response Tolerated well  Mobility Referral Yes  $Mobility charge 1 Mobility    Pt received in bed and agreeable to mobility. No complaints on walk, took seated break x1. Left in bed w/ bed alarm on and call bell at her side.   [place] Specialist Please contact via SecureChat or Rehab office at [phone]

## 2022-04-18 NOTE — H&P (View-Only) (Signed)
CARDIOLOGY CONSULT NOTE  Patient ID: Chelsea English MRN: [id number] DOB/AGE: [birth year] 62 y.o.  Admit date: 04/15/2022 Referring Physician: [facility] hospitalist Reason for Consultation: Severe aortic stenosis  HPI:   62 year old [place] female with hypertension, hyperlipidemia, type 2 diabetes mellitus, diabetic retinopathy w. left eye blindness, chronic kidney disease, severe aortic stenosis, acute on chronic HFpEF  Patient speaks Arabic.  Patient's daughter Neysa Bonito is a Ship broker working at [employer], who assisted with language interpretation.  Patient lives in [place] with her other daughter.  At baseline, she is fairly functional and independent, in spite of her left eye blindness.  Recently, she has noticed shortness of breath, more so when she bends down and does more than usual physical activity at home.  She also had bilateral leg swelling.  This led to hospitalization on 04/15/2022.  While hospitalized, she had at least 1 episode of pain under her left breast, radiating to her back, lasted for about 1 hour, resolved with some NSAID.  She has not had any recurrent chest pain, at least at rest.  Workup this hospitalization showed severe aortic stenosis on echocardiogram, details below.  BNP and high-sensitivity troponin were mildly elevated.  Creatinine on admission was 1.66, now elevated to 2.05.  She is -6 L for the hospital stay.   Patient last had an echocardiogram in [facility] system in 2021 which reportedly had showed moderate aortic stenosis.  Patient has never seen a cardiologist before.  At present, she denies any chest pain, shortness of breath.  Leg edema is resolved.   Past Medical History:  Diagnosis Date   Anemia    Aortic stenosis    Coronary artery disease    Diabetes mellitus without complication ([place])    Hyperlipidemia    Hypertension      Past Surgical History:  Procedure Laterality Date   ARTERY BIOPSY Right 07/20/2021   Procedure: BIOPSY OF  RIGHT TEMPORAL ARTERY;  Surgeon: Angelia Mould, MD;  Location: [facility] OR;  Service: Vascular;  Laterality: Right;   cataract Right    HYSTEROSCOPY WITH D & C     removal of polyp   MULTIPLE TOOTH EXTRACTIONS     no teeth, no dentures     History reviewed. No pertinent family history.   Social History: Social History   Socioeconomic History   Marital status: Widowed    Spouse name: Not on file   Number of children: Not on file   Years of education: Not on file   Highest education level: Not on file  Occupational History   Not on file  Tobacco Use   Smoking status: Never   Smokeless tobacco: Never  Vaping Use   Vaping Use: Never used  Substance and Sexual Activity   Alcohol use: Never   Drug use: Never   Sexual activity: Not Currently    Birth control/protection: Post-menopausal  Other Topics Concern   Not on file  Social History Narrative   Not on file   Social Determinants of Health   Financial Resource Strain: Not on file  Food Insecurity: No Food Insecurity (04/15/2022)   Hunger Vital Sign    Worried About Running Out of Food in the Last Year: Never true    Ran Out of Food in the Last Year: Never true  Transportation Needs: No Transportation Needs (04/15/2022)   PRAPARE - Transportation    Lack of Transportation (Medical): No    Lack of Transportation (Non-Medical): No  Physical Activity: Not on file  Stress: Not on file  Social Connections: Not on file  Intimate Partner Violence: Not At Risk (04/15/2022)   Humiliation, Afraid, Rape, and Kick questionnaire    Fear of Current or Ex-Partner: No    Emotionally Abused: No    Physically Abused: No    Sexually Abused: No     Medications Prior to Admission  Medication Sig Dispense Refill Last Dose   amLODipine (NORVASC) 5 MG tablet Take 5 mg by mouth at bedtime.   04/14/2022   aspirin 81 MG chewable tablet Chew 81 mg by mouth daily.   04/14/2022   atorvastatin (LIPITOR) 40 MG tablet Take 40 mg by mouth at  bedtime.   04/14/2022   BD INSULIN SYRINGE U/F 31G X 5/16" 1 ML MISC Inject 1 each into the skin 3 (three) times daily. Use to inject unsulin      benzonatate (TESSALON) 100 MG capsule Take 100 mg by mouth 2 (two) times daily as needed for cough.   04/15/2022   cyclobenzaprine (FLEXERIL) 5 MG tablet Take 10 mg by mouth 2 (two) times daily as needed for muscle spasms.   04/14/2022   ferrous sulfate 325 (65 FE) MG tablet Take 650 mg by mouth daily with breakfast.   04/14/2022   gabapentin (NEURONTIN) 100 MG capsule Take 100 mg by mouth daily.   04/14/2022   insulin NPH-regular Human (HUMULIN 70/30) (70-30) 100 UNIT/ML injection Inject 40-60 Units into the skin See admin instructions. Injecting 60 units in the morning and 40 units at bedtime.   04/14/2022   JANUVIA 100 MG tablet Take 100 mg by mouth daily.   04/14/2022   losartan (COZAAR) 50 MG tablet Take 50 mg by mouth daily.   04/14/2022   metFORMIN (GLUCOPHAGE) 500 MG tablet Take 500 mg by mouth 2 (two) times daily.   04/14/2022   Multiple Vitamins-Minerals (MULTI FOR HER 50+ PO) Take 1 tablet by mouth daily.   04/14/2022    Review of Systems  Cardiovascular:  Positive for chest pain (1 episode as per HPI, currently absent.), dyspnea on exertion (Present on admission.  Currently absent, at least at rest.) and leg swelling (Present on admission, now resolved). Negative for palpitations and syncope.      Physical Exam: Physical Exam Vitals and nursing note reviewed.  Constitutional:      General: She is not in acute distress. Neck:     Vascular: No JVD.  Cardiovascular:     Rate and Rhythm: Normal rate and regular rhythm.     Pulses:          Carotid pulses are  on the right side with bruit and  on the left side with bruit.    Heart sounds: Murmur heard.     Harsh midsystolic murmur is present with a grade of 4/6 at the upper right sternal border radiating to the neck.  Pulmonary:     Effort: Pulmonary effort is normal.     Breath sounds:  Normal breath sounds. No wheezing or rales.  Musculoskeletal:     Right lower leg: No edema.     Left lower leg: No edema.        Imaging/tests reviewed and independently interpreted: Lab Results: CBC, BMP, Trop HS, BNP  CT Chest 04/15/2022: Enlarged heart. Bilateral areas of ground-glass opacity, interstitial septal thickening and more confluent opacity along the lung bases with small effusions. Overall favor these areas to represent edema. Component of infiltrate is difficult to completely exclude and recommend follow-up.  6 mm right upper lobe lung nodule which is noncalcified. Non-contrast chest CT at 6 months is recommended. If the nodule is stable at time of repeat CT, then future CT at 18-24 months (from today's scan) is considered optional for low-risk patients, but is recommended for high-risk patients. This recommendation follows the consensus statement: Guidelines for Management of Incidental Pulmonary Nodules Detected on CT Images: From the Fleischner Society 2017; Radiology 2017; 284:228-243.   Scattered vascular calcifications including along the coronary arteries and significant calcification along the aortic valve.   Aortic Atherosclerosis  Cardiac Studies:  Telemetry 04/18/2022: No significant arrhythmia  EKG 04/15/2022: Sinus tachycardia Low voltage, precordial leads Borderline repolarization abnormality  Echocardiogram 04/16/2022:  1. The aortic valve is tricuspid. There is severe calcifcation of the  aortic valve. There is severe thickening of the aortic valve. Aortic valve  regurgitation is trivial. Severe aortic valve stenosis with AVA 0.71cm2,  Aortic valve mean gradient measures   47.0 mmHg. Aortic valve Vmax measures 4.44 m/s. DI 0.23.   2. Left ventricular ejection fraction, by estimation, is 60 to 65%. The  left ventricle has normal function. The left ventricle has no regional  wall motion abnormalities. There is moderate concentric left  ventricular  hypertrophy. Left ventricular  diastolic parameters are consistent with Grade I diastolic dysfunction  (impaired relaxation).   3. Right ventricular systolic function is normal. The right ventricular  size is normal. There is normal pulmonary artery systolic pressure. The  estimated right ventricular systolic pressure is 0000000 mmHg.   4. Left atrial size was mildly dilated.   5. The mitral valve is degenerative. Trivial mitral valve regurgitation.   6. The inferior vena cava is normal in size with greater than 50%  respiratory variability, suggesting right atrial pressure of 3 mmHg.   Comparison(s): No prior Echocardiogram.     Assessment & Recommendations:  62 year old [place] female with hypertension, hyperlipidemia, type 2 diabetes mellitus, diabetic retinopathy w. left eye blindness, chronic kidney disease, severe aortic stenosis, acute on chronic HFpEF  HFpEF, severe aortic stenosis: Valvular cardiomyopathy with preserved EF.  Acute on chronic presentation currently resolved. Severe aortic stenosis on echocardiogram.  Mildly elevated BNP and troponin, with at least 1 episode of chest pain. Risk factors for CAD including hypertension, type 2 diabetes mellitus. Currently euvolemic after aggressive diuresis, coinciding with mild elevated creatinine, acute kidney injury.  Patient will need workup for aortic valve replacement, TAVR versus SAVR depending on coronary anatomy.  Ideally, would like to perform right heart catheterization and coronary angiography this hospitalization, followed by outpatient CTA and other workup as per structural valve clinic.  I have encouraged her to increase oral hydration and intake today with hopes of improvement in renal function, as close to baseline of 1.6 as possible.  If renal function improves, I will perform coronary angiography and right heart catheterization tomorrow morning.  Risks, benefits, alternate options discussed with the  patient and her daughter in detail.  Continue aspirin, statin, carvedilol, management of diabetes. Hold any diuresis for now.  Discussed interpretation of tests and management recommendations with the primary team     [name], MD Pager: [phone] Office: [phone]

## 2022-04-19 ENCOUNTER — Encounter (HOSPITAL_COMMUNITY)
Admission: EM | Disposition: A | Discharge status: Home Health | Payer: Self-pay | Source: Home / Self Care | Attending: Internal Medicine

## 2022-04-19 ENCOUNTER — Encounter (HOSPITAL_COMMUNITY): Payer: Self-pay | Admitting: Cardiology

## 2022-04-19 ENCOUNTER — Other Ambulatory Visit: Payer: Self-pay | Admitting: Cardiology

## 2022-04-19 DIAGNOSIS — I350 Nonrheumatic aortic (valve) stenosis: Secondary | ICD-10-CM

## 2022-04-19 HISTORY — PX: RIGHT AND LEFT HEART CATH: CATH118262

## 2022-04-19 LAB — POCT I-STAT 7, (LYTES, BLD GAS, ICA,H+H)
Acid-Base Excess: 0 mmol/L (ref 0.0–2.0)
Bicarbonate: 25.6 mmol/L (ref 20.0–28.0)
Calcium, Ion: 1.3 mmol/L (ref 1.15–1.40)
HCT: 29 % — ABNORMAL LOW (ref 36.0–46.0)
Hemoglobin: 9.9 g/dL — ABNORMAL LOW (ref 12.0–15.0)
O2 Saturation: 90 %
Potassium: 4.2 mmol/L (ref 3.5–5.1)
Sodium: 138 mmol/L (ref 135–145)
TCO2: 27 mmol/L (ref 22–32)
pCO2 arterial: 45.1 mmHg (ref 32–48)
pH, Arterial: 7.362 (ref 7.35–7.45)
pO2, Arterial: 61 mmHg — ABNORMAL LOW (ref 83–108)

## 2022-04-19 LAB — BASIC METABOLIC PANEL
Anion gap: 11 (ref 5–15)
BUN: 67 mg/dL — ABNORMAL HIGH (ref 8–23)
CO2: 25 mmol/L (ref 22–32)
Calcium: 9.4 mg/dL (ref 8.9–10.3)
Chloride: 97 mmol/L — ABNORMAL LOW (ref 98–111)
Creatinine, Ser: 1.74 mg/dL — ABNORMAL HIGH (ref 0.44–1.00)
GFR, Estimated: 33 mL/min — ABNORMAL LOW (ref >60)
Glucose, Bld: 197 mg/dL — ABNORMAL HIGH (ref 70–99)
Potassium: 3.8 mmol/L (ref 3.5–5.1)
Sodium: 133 mmol/L — ABNORMAL LOW (ref 135–145)

## 2022-04-19 LAB — RENAL FUNCTION PANEL
Albumin: 3.4 g/dL — ABNORMAL LOW (ref 3.5–5.0)
Anion gap: 10 (ref 5–15)
BUN: 71 mg/dL — ABNORMAL HIGH (ref 8–23)
CO2: 25 mmol/L (ref 22–32)
Calcium: 9.1 mg/dL (ref 8.9–10.3)
Chloride: 93 mmol/L — ABNORMAL LOW (ref 98–111)
Creatinine, Ser: 1.81 mg/dL — ABNORMAL HIGH (ref 0.44–1.00)
GFR, Estimated: 31 mL/min — ABNORMAL LOW (ref >60)
Glucose, Bld: 237 mg/dL — ABNORMAL HIGH (ref 70–99)
Phosphorus: 5.2 mg/dL — ABNORMAL HIGH (ref 2.5–4.6)
Potassium: 4.1 mmol/L (ref 3.5–5.1)
Sodium: 128 mmol/L — ABNORMAL LOW (ref 135–145)

## 2022-04-19 LAB — CBC
HCT: 26.6 % — ABNORMAL LOW (ref 36.0–46.0)
Hemoglobin: 9.1 g/dL — ABNORMAL LOW (ref 12.0–15.0)
MCH: 27.6 pg (ref 26.0–34.0)
MCHC: 34.2 g/dL (ref 30.0–36.0)
MCV: 80.6 fL (ref 80.0–100.0)
Platelets: 252 10*3/uL (ref 150–400)
RBC: 3.3 MIL/uL — ABNORMAL LOW (ref 3.87–5.11)
RDW: 13.2 % (ref 11.5–15.5)
WBC: 11.7 10*3/uL — ABNORMAL HIGH (ref 4.0–10.5)
nRBC: 0 % (ref 0.0–0.2)

## 2022-04-19 LAB — POCT I-STAT EG7
Acid-base deficit: 1 mmol/L (ref 0.0–2.0)
Bicarbonate: 25.1 mmol/L (ref 20.0–28.0)
Calcium, Ion: 1.23 mmol/L (ref 1.15–1.40)
HCT: 29 % — ABNORMAL LOW (ref 36.0–46.0)
Hemoglobin: 9.9 g/dL — ABNORMAL LOW (ref 12.0–15.0)
O2 Saturation: 60 %
Potassium: 4 mmol/L (ref 3.5–5.1)
Sodium: 139 mmol/L (ref 135–145)
TCO2: 27 mmol/L (ref 22–32)
pCO2, Ven: 47.5 mmHg (ref 44–60)
pH, Ven: 7.331 (ref 7.25–7.43)
pO2, Ven: 34 mmHg (ref 32–45)

## 2022-04-19 LAB — GLUCOSE, CAPILLARY
Glucose-Capillary: 225 mg/dL — ABNORMAL HIGH (ref 70–99)
Glucose-Capillary: 209 mg/dL — ABNORMAL HIGH (ref 70–99)
Glucose-Capillary: 217 mg/dL — ABNORMAL HIGH (ref 70–99)
Glucose-Capillary: 187 mg/dL — ABNORMAL HIGH (ref 70–99)

## 2022-04-19 LAB — PATHOLOGIST SMEAR REVIEW

## 2022-04-19 SURGERY — RIGHT AND LEFT HEART CATH

## 2022-04-19 MED ORDER — FENTANYL CITRATE (PF) 100 MCG/2ML IJ SOLN
INTRAMUSCULAR | Status: DC | PRN
  Administered 2022-04-19: 25 ug via INTRAVENOUS

## 2022-04-19 MED ORDER — MIDAZOLAM HCL 2 MG/2ML IJ SOLN
INTRAMUSCULAR | Status: DC | PRN
  Administered 2022-04-19: 1 mg via INTRAVENOUS

## 2022-04-19 MED ORDER — LIDOCAINE HCL (PF) 1 % IJ SOLN
INTRAMUSCULAR | Status: DC | PRN
  Administered 2022-04-19 (×2): 2 mL

## 2022-04-19 MED ORDER — LABETALOL HCL 5 MG/ML IV SOLN: 10.0000 mg | INTRAVENOUS | Status: AC | PRN

## 2022-04-19 MED ORDER — HEPARIN SODIUM (PORCINE) 1000 UNIT/ML IJ SOLN
INTRAMUSCULAR | Status: AC
  Filled 2022-04-19: qty 10

## 2022-04-19 MED ORDER — ACETAMINOPHEN 325 MG PO TABS: 650.0000 mg | ORAL_TABLET | ORAL | Status: DC | PRN

## 2022-04-19 MED ORDER — ONDANSETRON HCL 4 MG/2ML IJ SOLN: 4.0000 mg | Freq: Four times a day (QID) | INTRAMUSCULAR | Status: DC | PRN

## 2022-04-19 MED ORDER — SODIUM CHLORIDE 0.9% FLUSH
3.0000 mL | Freq: Two times a day (BID) | INTRAVENOUS | Status: DC
  Administered 2022-04-19: 3 mL via INTRAVENOUS

## 2022-04-19 MED ORDER — VERAPAMIL HCL 2.5 MG/ML IV SOLN
INTRAVENOUS | Status: DC | PRN
  Administered 2022-04-19: 10 mL via INTRA_ARTERIAL

## 2022-04-19 MED ORDER — SODIUM CHLORIDE 0.9 % IV SOLN: INTRAVENOUS | Status: DC

## 2022-04-19 MED ORDER — MIDAZOLAM HCL 2 MG/2ML IJ SOLN
INTRAMUSCULAR | Status: AC
  Filled 2022-04-19: qty 2

## 2022-04-19 MED ORDER — SODIUM CHLORIDE 0.9% FLUSH: 3.0000 mL | INTRAVENOUS | Status: DC | PRN

## 2022-04-19 MED ORDER — HEPARIN SODIUM (PORCINE) 5000 UNIT/ML IJ SOLN
5000.0000 [IU] | Freq: Three times a day (TID) | INTRAMUSCULAR | Status: DC
  Administered 2022-04-19 – 2022-04-20 (×2): 5000 [IU] via SUBCUTANEOUS
  Filled 2022-04-19 (×2): qty 1

## 2022-04-19 MED ORDER — HEPARIN SODIUM (PORCINE) 1000 UNIT/ML IJ SOLN
INTRAMUSCULAR | Status: DC | PRN
  Administered 2022-04-19: 4500 [IU] via INTRAVENOUS

## 2022-04-19 MED ORDER — ASPIRIN 81 MG PO CHEW
81.0000 mg | CHEWABLE_TABLET | ORAL | Status: AC
  Administered 2022-04-19: 81 mg via ORAL
  Filled 2022-04-19: qty 1

## 2022-04-19 MED ORDER — IOHEXOL 350 MG/ML SOLN
INTRAVENOUS | Status: DC | PRN
  Administered 2022-04-19: 30 mL

## 2022-04-19 MED ORDER — LIDOCAINE HCL (PF) 1 % IJ SOLN
INTRAMUSCULAR | Status: AC
  Filled 2022-04-19: qty 30

## 2022-04-19 MED ORDER — SODIUM CHLORIDE 0.9 % IV SOLN: INTRAVENOUS | Status: AC

## 2022-04-19 MED ORDER — HEPARIN (PORCINE) IN NACL 1000-0.9 UT/500ML-% IV SOLN
INTRAVENOUS | Status: DC | PRN
  Administered 2022-04-19 (×2): 500 mL

## 2022-04-19 MED ORDER — SODIUM CHLORIDE 0.9% FLUSH
3.0000 mL | Freq: Two times a day (BID) | INTRAVENOUS | Status: DC
  Administered 2022-04-19 – 2022-04-20 (×2): 3 mL via INTRAVENOUS

## 2022-04-19 MED ORDER — INSULIN DETEMIR 100 UNIT/ML ~~LOC~~ SOLN
25.0000 [IU] | Freq: Two times a day (BID) | SUBCUTANEOUS | Status: DC
  Administered 2022-04-19 – 2022-04-20 (×2): 25 [IU] via SUBCUTANEOUS
  Filled 2022-04-19 (×4): qty 0.25

## 2022-04-19 MED ORDER — SODIUM CHLORIDE 0.9 % IV SOLN: 250.0000 mL | INTRAVENOUS | Status: DC | PRN

## 2022-04-19 MED ORDER — FENTANYL CITRATE (PF) 100 MCG/2ML IJ SOLN
INTRAMUSCULAR | Status: AC
  Filled 2022-04-19: qty 2

## 2022-04-19 MED ORDER — HYDRALAZINE HCL 20 MG/ML IJ SOLN: 10.0000 mg | INTRAMUSCULAR | Status: AC | PRN

## 2022-04-19 MED ORDER — VERAPAMIL HCL 2.5 MG/ML IV SOLN
INTRAVENOUS | Status: AC
  Filled 2022-04-19: qty 2

## 2022-04-19 SURGICAL SUPPLY — 17 items
BAND CMPR LRG ZPHR (HEMOSTASIS) ×1
BAND ZEPHYR COMPRESS 30 LONG (HEMOSTASIS) IMPLANT
CATH 5FR JL3.5 JR4 ANG PIG MP (CATHETERS) IMPLANT
CATH BALLN WEDGE 5F 110CM (CATHETERS) IMPLANT
CATH LAUNCHER 5F EBU3.0 (CATHETERS) IMPLANT
CATHETER LAUNCHER 5F EBU3.0 (CATHETERS) ×1
GLIDESHEATH SLEND A-KIT 6F 22G (SHEATH) IMPLANT
GUIDEWIRE INQWIRE 1.5J.035X260 (WIRE) IMPLANT
INQWIRE 1.5J .035X260CM (WIRE) ×1
KIT HEART LEFT (KITS) ×1 IMPLANT
PACK CARDIAC CATHETERIZATION (CUSTOM PROCEDURE TRAY) ×1 IMPLANT
SHEATH GLIDE SLENDER 4/5FR (SHEATH) IMPLANT
SHEATH PROBE COVER 6X72 (BAG) IMPLANT
SYR MEDRAD MARK 7 150ML (SYRINGE) ×1 IMPLANT
TRANSDUCER W/STOPCOCK (MISCELLANEOUS) ×1 IMPLANT
TUBING CIL FLEX 10 FLL-RA (TUBING) ×1 IMPLANT
WIRE EMERALD 3MM-J .025X260CM (WIRE) IMPLANT

## 2022-04-19 NOTE — Progress Notes (Signed)
TR band has been remove, site is level zero, tegaderm applied to site.

## 2022-04-19 NOTE — Progress Notes (Signed)
PT Cancellation Note  Patient Details Name: Chelsea English MRN: [id number] DOB: 31-Mar-1960   Cancelled Treatment:    Reason Eval/Treat Not Completed: (P) Patient at procedure or test/unavailable (pt at Cath Lab for procedure.) Will continue efforts per PT plan of care once medically appropriate as schedule permits.   Meldrick Buttery M Zita Ozimek 04/19/2022, 1:10 PM

## 2022-04-19 NOTE — Progress Notes (Incomplete)
PROGRESS NOTE    Chelsea English  [id number] DOB: October 03, 1960 DOA: 04/15/2022 PCP: Donnal Debar, PA-C  61/F w/ Moderate aortic stenosis, T2DM, CKD stage 3b and dyslipidemia who presented with dyspnea and edema. X  2 days. In ED,  in respiratory distress and was placed on Bipap, glucose 192 bun 41 cr 1,66, BNP 100.7, CXR with cardiomegaly, bilateral hilar vascular congestion with bilateral interstitial infiltrates with cephalization of the vasculature, bilateral pleural effusions. CT chest with bilateral ground glass opacities, with bilateral small pleural effusions.  6 mm nodule right upper lung nodule non calcified. -2/25 patient has responded well to diuretics Echocardiogram with severe aortic stenosis.  -2/26 consulted cardiology plan for cardiac catheterization and start work up for possible TAVR.  -2/27 cardiac cath: minimal CAD, refer for TAVR    Subjective:   Assessment and Plan:  Severe Aortic Stenosis Acute on chronic diastolic CHF  -Echo LV EF 60 to 65%, moderate LVH, RV systolic function preserved, severe aortic valve stenosis.  -Continue with carvedilol.  -diuresed with IV lasix, GDMT limited by CKD -Cath for TAVR workup w/ no significant CAD  Troponin elevation  -due to heart failure exacerbation, ruled out for acute coronary syndrome.   Acute hypoxemic respiratory failure  -due to acute cardiogenic pulmonary edema.  -Follow PT and OT. Out of bed to chair tid with meals.   Essential hypertension Continue carvedilol  Stage 3b chronic kidney disease (CKD) (HCC) AKI, hyponatremia.  -baseline creat 1.5-1.7 -diuretic therapy has been on hold.  -Avoid hypotension and nephrotoxic medications.   Anemia of chronic renal disease, cell count has been stable.   Type 2 diabetes mellitus with hyperlipidemia (HCC) T2DM with uncontrolled hyperglycemia.  -Basal insulin 20 units bid, will increase to 25 units bid.   -metformin on hold  Class 2  obesity Calculated BMI is 35.69   DVT prophylaxis: Heparin SQ Code Status: Full Code Family Communication: Disposition Plan:   Consultants:    Procedures:   Antimicrobials:    Objective: Vitals:   04/19/22 1530 04/19/22 1545 04/19/22 1600 04/19/22 1615  BP:  132/73 130/70 139/65  Pulse: 73  75 74  Resp:    17  Temp:    97.8 F (36.6 C)  TempSrc:    Oral  SpO2: 98%  98% 98%  Weight:      Height:        Intake/Output Summary (Last 24 hours) at 04/19/2022 1846 Last data filed at 04/19/2022 1618 Gross per 24 hour  Intake 518.97 ml  Output 900 ml  Net -381.03 ml   Filed Weights   04/17/22 0431 04/18/22 0040 04/19/22 0625  Weight: 88.8 kg 88.8 kg 88.4 kg    Examination:      Data Reviewed:   CBC: Recent Labs  Lab 04/15/22 1006 04/15/22 1022 04/15/22 1207 04/16/22 0056 04/19/22 0745 04/19/22 1332  WBC 19.4*  --   --  11.2* 11.7*  --   NEUTROABS 9.1*  --   --  8.3*  --   --   HGB 9.4* 9.9* 8.8* 8.6* 9.1* 9.9*  9.9*  HCT 30.3* 29.0* 26.0* 25.9* 26.6* 29.0*  29.0*  MCV 86.3  --   --  83.0 80.6  --   PLT 248  --   --  209 252  --    Basic Metabolic Panel: Recent Labs  Lab 04/16/22 0056 04/17/22 0048 04/18/22 0040 04/19/22 0045 04/19/22 0745 04/19/22 1332  NA 137 133* 132* 128* 133* 138  139  K  4.9 4.6 4.4 4.1 3.8 4.2  4.0  CL 103 97* 94* 93* 97*  --   CO2 '23 27 26 25 25  '$ --   GLUCOSE 224* 327* 322* 237* 197*  --   BUN 39* 48* 69* 71* 67*  --   CREATININE 1.75* 1.90* 2.05* 1.81* 1.74*  --   CALCIUM 9.3 9.4 9.4 9.1 9.4  --   MG 2.0 1.9  --   --   --   --   PHOS 4.0  --   --  5.2*  --   --    GFR: Estimated Creatinine Clearance: 35.4 mL/min (A) (by C-G formula based on SCr of 1.74 mg/dL (H)). Liver Function Tests: Recent Labs  Lab 04/15/22 1006 04/16/22 0056 04/19/22 0045  AST 77* 36  --   ALT 65* 55*  --   ALKPHOS 162* 122  --   BILITOT 1.0 1.1  --   PROT 8.6* 7.3  --   ALBUMIN 4.2 3.6 3.4*   No results for input(s): "LIPASE",  "AMYLASE" in the last 168 hours. No results for input(s): "AMMONIA" in the last 168 hours. Coagulation Profile: Recent Labs  Lab 04/16/22 0056  INR 1.2   Cardiac Enzymes: No results for input(s): "CKTOTAL", "CKMB", "CKMBINDEX", "TROPONINI" in the last 168 hours. BNP (last 3 results) No results for input(s): "PROBNP" in the last 8760 hours. HbA1C: No results for input(s): "HGBA1C" in the last 72 hours. CBG: Recent Labs  Lab 04/18/22 1630 04/18/22 2123 04/19/22 0632 04/19/22 1144 04/19/22 1616  GLUCAP 214* 227* 225* 209* 217*   Lipid Profile: No results for input(s): "CHOL", "HDL", "LDLCALC", "TRIG", "CHOLHDL", "LDLDIRECT" in the last 72 hours. Thyroid Function Tests: No results for input(s): "TSH", "T4TOTAL", "FREET4", "T3FREE", "THYROIDAB" in the last 72 hours. Anemia Panel: No results for input(s): "VITAMINB12", "FOLATE", "FERRITIN", "TIBC", "IRON", "RETICCTPCT" in the last 72 hours. Urine analysis:    Component Value Date/Time   COLORURINE STRAW (A) 04/15/2022 2253   APPEARANCEUR CLEAR 04/15/2022 2253   LABSPEC 1.011 04/15/2022 2253   PHURINE 6.0 04/15/2022 2253   GLUCOSEU NEGATIVE 04/15/2022 2253   HGBUR NEGATIVE 04/15/2022 2253   BILIRUBINUR NEGATIVE 04/15/2022 2253   KETONESUR NEGATIVE 04/15/2022 2253   PROTEINUR NEGATIVE 04/15/2022 2253   NITRITE NEGATIVE 04/15/2022 2253   LEUKOCYTESUR SMALL (A) 04/15/2022 2253   Sepsis Labs: '@LABRCNTIP'$ (procalcitonin:4,lacticidven:4)  ) Recent Results (from the past 240 hour(s))  Resp panel by RT-PCR (RSV, Flu A&B, Covid) Anterior Nasal Swab     Status: None   Collection Time: 04/15/22 10:08 AM   Specimen: Anterior Nasal Swab  Result Value Ref Range Status   SARS Coronavirus 2 by RT PCR NEGATIVE NEGATIVE Final    Comment: (NOTE) SARS-CoV-2 target nucleic acids are NOT DETECTED.  The SARS-CoV-2 RNA is generally detectable in upper respiratory specimens during the acute phase of infection. The lowest concentration of  SARS-CoV-2 viral copies this assay can detect is 138 copies/mL. A negative result does not preclude SARS-Cov-2 infection and should not be used as the sole basis for treatment or other patient management decisions. A negative result may occur with  improper specimen collection/handling, submission of specimen other than nasopharyngeal swab, presence of viral mutation(s) within the areas targeted by this assay, and inadequate number of viral copies(<138 copies/mL). A negative result must be combined with clinical observations, patient history, and epidemiological information. The expected result is Negative.  Fact Sheet for Patients:  EntrepreneurPulse.com.au  Fact Sheet for Healthcare Providers:  IncredibleEmployment.be  This test is no t yet approved or cleared by the [place] and  has been authorized for detection and/or diagnosis of SARS-CoV-2 by FDA under an Emergency Use Authorization (EUA). This EUA will remain  in effect (meaning this test can be used) for the duration of the COVID-19 declaration under Section 564(b)(1) of the Act, 21 U.S.C.section 360bbb-3(b)(1), unless the authorization is terminated  or revoked sooner.       Influenza A by PCR NEGATIVE NEGATIVE Final   Influenza B by PCR NEGATIVE NEGATIVE Final    Comment: (NOTE) The Xpert Xpress SARS-CoV-2/FLU/RSV plus assay is intended as an aid in the diagnosis of influenza from Nasopharyngeal swab specimens and should not be used as a sole basis for treatment. Nasal washings and aspirates are unacceptable for Xpert Xpress SARS-CoV-2/FLU/RSV testing.  Fact Sheet for Patients: EntrepreneurPulse.com.au  Fact Sheet for Healthcare Providers: IncredibleEmployment.be  This test is not yet approved or cleared by the [place] FDA and has been authorized for detection and/or diagnosis of SARS-CoV-2 by FDA under an Emergency Use  Authorization (EUA). This EUA will remain in effect (meaning this test can be used) for the duration of the COVID-19 declaration under Section 564(b)(1) of the Act, 21 U.S.C. section 360bbb-3(b)(1), unless the authorization is terminated or revoked.     Resp Syncytial Virus by PCR NEGATIVE NEGATIVE Final    Comment: (NOTE) Fact Sheet for Patients: EntrepreneurPulse.com.au  Fact Sheet for Healthcare Providers: IncredibleEmployment.be  This test is not yet approved or cleared by the [place] FDA and has been authorized for detection and/or diagnosis of SARS-CoV-2 by FDA under an Emergency Use Authorization (EUA). This EUA will remain in effect (meaning this test can be used) for the duration of the COVID-19 declaration under Section 564(b)(1) of the Act, 21 U.S.C. section 360bbb-3(b)(1), unless the authorization is terminated or revoked.  Performed at [facility], [address] [place], [place] [address]      Radiology Studies: CARDIAC CATHETERIZATION  Result Date: 04/19/2022 Images from the original result were not included. LM: Normal LAD: No significant disease Lcx: Mid 20% disease RCA: Mid 20% disease RA: 9 mmHg RV: 45/6 mmHg PA: 37/16 mmHg, mPAP 25 mmHg PCW: 14 mmHg CO: 6.8 L/min CI: 3.5 L/min/m2 No significant coronary artery disease Will refer for TAVR [name], MD Pager: [phone] Office: [phone]    Scheduled Meds:  aspirin  81 mg Oral Daily   atorvastatin  40 mg Oral QHS   carvedilol  3.125 mg Oral BID WC   ferrous sulfate  650 mg Oral Q breakfast   gabapentin  100 mg Oral QHS   heparin  5,000 Units Subcutaneous Q8H   insulin aspart  0-15 Units Subcutaneous TID WC   insulin detemir  25 Units Subcutaneous BID   sodium chloride flush  3 mL Intravenous Q12H   Continuous Infusions:  sodium chloride       LOS: 4 days    Time spent: 56mn    PDomenic Polite MD [facility]   04/19/2022, 6:46 PM

## 2022-04-19 NOTE — Progress Notes (Signed)
Inpatient Diabetes Program Recommendations  AACE/ADA: New Consensus Statement on Inpatient Glycemic Control (2015)  Target Ranges:  Prepandial:   less than 140 mg/dL      Peak postprandial:   less than 180 mg/dL (1-2 hours)      Critically ill patients:  140 - 180 mg/dL   Lab Results  Component Value Date   GLUCAP 225 (H) 04/19/2022   HGBA1C 6.8 (H) 04/15/2022    Review of Glycemic Control  Latest Reference Range & Units 04/17/22 21:04 04/18/22 06:11 04/18/22 11:25 04/18/22 16:30 04/18/22 21:23 04/19/22 06:32  Glucose-Capillary 70 - 99 mg/dL 197 (H) 257 (H) 216 (H) 214 (H) 227 (H) 225 (H)   Diabetes history: DM  Outpatient Diabetes medications:  Humulin 70/30- 60 units in the AM and 40 units in the PM Januvia 100 mg daily Metformin 500 mg bid Current orders for Inpatient glycemic control:  Novolog 0-15 units tid with meals Levemir 20 units bid  Inpatient Diabetes Program Recommendations:    Consider increasing Levemir to 25 units bid.  Also consider adding Novolog 3 units tid with meals (hold if patient eats less than 50% and or NPO).    Thanks,  Adah Perl, RN, BC-ADM Inpatient Diabetes Coordinator Pager [phone]  (8a-5p)

## 2022-04-19 NOTE — Interval H&P Note (Signed)
History and Physical Interval Note:  04/19/2022 1:06 PM  Chelsea English  has presented today for surgery, with the diagnosis of aortic stenosis.  The various methods of treatment have been discussed with the patient and family. After consideration of risks, benefits and other options for treatment, the patient has consented to  Procedure(s): RIGHT/LEFT HEART CATH AND CORONARY ANGIOGRAPHY (N/A) as a surgical intervention.  The patient's history has been reviewed, patient examined, no change in status, stable for surgery.  I have reviewed the patient's chart and labs.  Questions were answered to the patient's satisfaction.    Cr improved to 1.75.  2012 Appropriate Use Criteria for Diagnostic Catheterization Valvular Disease (Right and Left Heart Catheterization or Right Heart Catheterization Alone With or Without Left Ventriculography and Coronary Angiography) Indication:  Preoperative assessment before valvular surgery A (7) Indication: 70; Score 7   Kerri-Anne Haeberle J Emmajane Altamura

## 2022-04-19 NOTE — Progress Notes (Signed)
Progress Note   Patient: Chelsea English [id number] DOB: 04-19-1960 DOA: 04/15/2022     4 DOS: the patient was seen and examined on 04/19/2022   Brief hospital course: Mrs. Gronau was admitted to the hospital with the working diagnosis of decompensated heart failure in the setting of worsening aortic stenosis.   62 yo female with the past medical history of moderate aortic stenosis, T2DM, CKD stage 3b and dyslipidemia who presented with dyspnea. Reported 24 hrs of progressive dyspnea with cough, along with lower extremity edema. Per her daughter patient was noted to have dyspnea on exertion for the last 2 days. On her initial physical examination patient was in respiratory distress and was placed on Bipap, blood pressure systolic 99991111,  HR 73, RR 28  and 02 saturation 99%, lungs with no wheezing or rhonchi, heart with S1 and S2 present and tachycardic, abdomen with no distention and positive lower extremity edema.   VBG 7.25/ 42/ 67/ 18 and 91%  Na 135, K 5,0 Cl 107 bicarbonate 19 glucose 192 bun 41 cr 1,66  High sensitive troponin 12  BNP 100.7  Lactic acid 3,2 and 1,7  Wbc 19, hgb 9,4 plt 248   Sars covid 19 negative   Chest radiograph with cardiomegaly, bilateral hilar vascular congestion with bilateral interstitial infiltrates with cephalization of the vasculature, bilateral pleural effusions more right than left.   CT chest with bilateral ground glass opacities, with bilateral small pleural effusions.  6 mm nodule right upper lung nodule non calcified. Non contrast CT at 6 months is recommended.   EKG 108 bpm, left axis deviation, normal intervals, sinus rhythm with no significant ST segment or T wave changes.   02/25 patient has responded well to medical therapy and has achieved a euvolemic state.  Echocardiogram with severe aortic stenosis.  02/26 consulted cardiology plan for cardiac catheterization and start work up for possible TAVR.  02/27 cardiac catheterization for today.    Assessment and Plan: * Acute on chronic diastolic CHF (congestive heart failure) (HCC) Echocardiogram with preserved LV systolic function, LV EF 60 to 65%, moderate LVH, RV systolic function preserved, RVSP 29,0, LA with mild dilatation, severe aortic valve stenosis.   Urine output is 123XX123  ml Systolic blood pressure 99991111 to 140 mmHg.   Continue with carvedilol.  Limited pharmacologic options due to reduced GFR.  Continue to hold on diuretic therapy for now. Plan for cardiac catheterization today, work up for possible TAVR.   Troponin elevation due to heart failure exacerbation, ruled out for acute coronary syndrome.  Lactic acidosis due to heart failure exacerbation.   Acute hypoxemic respiratory failure due to acute cardiogenic pulmonary edema.  Oxymetry has improved post diuresis, her 02 saturation today is 95% on room air.    Follow PT and OT. Out of bed to chair tid with meals.   Essential hypertension Continue blood pressure control with carvedilol. Continue blood pressure monitoring.   Stage 3b chronic kidney disease (CKD) (HCC) AKI, hyponatremia.   Patient with improved volume status, diuretic therapy has been on hold.  Renal function with serum cr at 1,74 with K at 3,8 and serum bicarbonate at 25. Na 133   Continue to hold on diuresis and follow up renal function in am.  Avoid hypotension and nephrotoxic medications.   Anemia of chronic renal disease, cell count has been stable.   Type 2 diabetes mellitus with hyperlipidemia (HCC) T2DM with uncontrolled hyperglycemia.   Capillary glucose 227, 225 and 209  Fasting glucose this  am 197  Basal insulin 20 units bid, will increase to 25 units bid.    Hold on metformin for now.   Continue statin therapy.   Class 2 obesity Calculated BMI is 35.69        Subjective: Patient with no chest pain or dyspnea, she has been out of bed to chair. Her family is at the bedside to interpret.   Physical Exam: Vitals:    04/19/22 0625 04/19/22 0634 04/19/22 0751 04/19/22 1146  BP:  123/66 (!) 116/59 (!) 141/74  Pulse:  81 80 72  Resp:  '18 18 20  '$ Temp:  98 F (36.7 C) 98 F (36.7 C) 97.7 F (36.5 C)  TempSrc:  Oral Oral Oral  SpO2:  91% 97% 96%  Weight: 88.4 kg     Height:       Neurology awake and alert ENT with mild pallor Cardiovascular with S1 and S2 present and rhythmic, positive systolic murmur at the base with no gallops, rubs or murmurs Respiratory with no rales or wheezing, no rhonchi Abdomen with no distention  No lower extremity edema  Data Reviewed:    Family Communication: I spoke with patient's daughters at the bedside, we talked in detail about patient's condition, plan of care and prognosis and all questions were addressed.   Disposition: Status is: Inpatient Remains inpatient appropriate because: cardiac work up for possible TAVR.   Planned Discharge Destination: Home      Author: Tawni Millers, MD 04/19/2022 12:24 PM  For on call review [email or web address].

## 2022-04-20 LAB — RENAL FUNCTION PANEL
Albumin: 3.3 g/dL — ABNORMAL LOW (ref 3.5–5.0)
Anion gap: 9 (ref 5–15)
BUN: 65 mg/dL — ABNORMAL HIGH (ref 8–23)
CO2: 24 mmol/L (ref 22–32)
Calcium: 8.9 mg/dL (ref 8.9–10.3)
Chloride: 100 mmol/L (ref 98–111)
Creatinine, Ser: 1.73 mg/dL — ABNORMAL HIGH (ref 0.44–1.00)
GFR, Estimated: 33 mL/min — ABNORMAL LOW (ref >60)
Glucose, Bld: 266 mg/dL — ABNORMAL HIGH (ref 70–99)
Phosphorus: 3.8 mg/dL (ref 2.5–4.6)
Potassium: 4.1 mmol/L (ref 3.5–5.1)
Sodium: 133 mmol/L — ABNORMAL LOW (ref 135–145)

## 2022-04-20 LAB — GLUCOSE, CAPILLARY
Glucose-Capillary: 242 mg/dL — ABNORMAL HIGH (ref 70–99)
Glucose-Capillary: 249 mg/dL — ABNORMAL HIGH (ref 70–99)

## 2022-04-20 MED ORDER — INSULIN DETEMIR 100 UNIT/ML ~~LOC~~ SOLN
30.0000 [IU] | Freq: Two times a day (BID) | SUBCUTANEOUS | Status: DC
  Filled 2022-04-20: qty 0.3

## 2022-04-20 MED ORDER — CARVEDILOL 3.125 MG PO TABS: 3.1250 mg | ORAL_TABLET | Freq: Two times a day (BID) | ORAL | 0 refills | Status: DC

## 2022-04-20 MED ORDER — FUROSEMIDE 20 MG PO TABS: 20.0000 mg | ORAL_TABLET | Freq: Every day | ORAL | 0 refills | Status: DC

## 2022-04-20 NOTE — Progress Notes (Signed)
Cr stable. Follow up will be arranged with TAVR clinic.  Okay to use PO lasix 20 mg daily on discharge.   Cardiology will sign off.   [name], MD Pager: [phone] Office: [phone]

## 2022-04-20 NOTE — TOC Transition Note (Addendum)
Transition of Care [facility]) - CM/SW Discharge Note   Patient Details  Name: Chelsea English MRN: [id number] Date of Birth: [birth year]  Transition of Care [facility]) CM/SW Contact:  Zenon Mayo, RN Phone Number: 04/20/2022, 10:52 AM   Clinical Narrative:    Patient is for dc today, she will need BSC and rollator and HHPT, [place].  NCM offered choice, daughter states she has no preference.  NCM made referral to [employer] for DME , which will be delivered to room prior to dc.  NCM made referral to [place], [facility] , [place] and [facility] awaiting to hear back from [place], to see if they can take referral,  Daughter will transport patient home at dc. Patient did not qualify for oxygen, but daughter wants to order oxygen and self pay for the oxygen because she uses it at night.  Daughter wanted to know how much the oxygen is for self pay,  Jermaine with [employer] states 185.00 per month.  She wants to go with that price. [facility]on [facility]ills will have the oxygen delivered to her home. Per Amy with [facility] they can take referral.    Final next level of care: Home w Home Health Services Barriers to Discharge: No Barriers Identified   Patient Goals and CMS Choice CMS Medicare.gov Compare Post Acute Care list provided to:: Patient Represenative (must comment) Choice offered to / list presented to : Adult Children  Discharge Placement                         Discharge Plan and Services Additional resources added to the After Visit Summary for                  DME Arranged: Walker rolling with seat, Bedside commode DME Agency: [employer] Date DME Agency Contacted: 04/20/22 Time DME Agency Contacted: [id number] Representative spoke with at DME Agency: [employer] HH Arranged: PT, OT HH Agency: [place] Date [place]: 04/20/22 Time [place]: [id number] Representative spoke with at [place]: Amy  Social Determinants of Health ([place]) Interventions SDOH Screenings   Food  Insecurity: No Food Insecurity (04/15/2022)  Housing: Low Risk  (04/15/2022)  Transportation Needs: No Transportation Needs (04/15/2022)  Utilities: Not At Risk (04/15/2022)  Tobacco Use: Low Risk  (04/19/2022)     Readmission Risk Interventions     No data to display

## 2022-04-20 NOTE — Progress Notes (Addendum)
Occupational Therapy Treatment Patient Details Name: Chelsea English MRN: [id number] DOB: 10/06/1960 Today's Date: 04/20/2022   History of present illness Pt is a 62 y/o F presenting to ED on 2/23 with SOB and worsening BLE edema. CT chest with bilateral small pleural effusions. Admitted for acute on chronic CHF. PMH includes DM, HTN, moderate aortic stenosis, DM2, CKD IIIB   OT comments  Pt progressing well towards goals this session, needing min guard-mod A for ADLs, and min guard for transfers/hallway mobility with and without use of rollator (rollator used for hallway mobility only). SpO2 92-93% on RA throughout session, pt needing min cues for limited use of RUE due to heart cath yesterday. Pt/family educated on use of 3in1 as shower seat for home. Pt presenting with impairments listed below, will follow acutely. Continue to recommend HHOT at d/c.   Recommendations for follow up therapy are one component of a multi-disciplinary discharge planning process, led by the attending physician.  Recommendations may be updated based on patient status, additional functional criteria and insurance authorization.    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) Restrictions Weight Bearing Restrictions: No       Mobility Bed Mobility               General bed mobility comments: seated EOB upon arrival and departure    Transfers Overall transfer level: Needs assistance Equipment used: Rollator (4 wheels), 1 person hand held  assist Transfers: Sit to/from Stand Sit to Stand: Min guard           General transfer comment: use of rollator for longer distance     Balance Overall balance assessment: Needs assistance Sitting-balance support: Feet supported Sitting balance-Leahy Scale: Good     Standing balance support: During functional activity Standing balance-Leahy Scale: Fair Standing balance comment: intermittently reaching out for external support                           ADL either performed or assessed with clinical judgement   ADL Overall ADL's : Needs assistance/impaired                 Upper Body Dressing : Minimal assistance;Standing Upper Body Dressing Details (indicate cue type and reason): donning gown on backside Lower Body Dressing: Moderate assistance;Sit to/from stand Lower Body Dressing Details (indicate cue type and reason): family assisting to don brief Toilet Transfer: Min guard;Ambulation;Rollator (4 wheels)       Tub/ Shower Transfer: Min guard;Tub transfer Tub/Shower Transfer Details (indicate cue type and reason): simulatd Functional mobility during ADLs: Min guard;Rollator (4 wheels)      Extremity/Trunk Assessment Upper Extremity Assessment Upper Extremity Assessment: Generalized weakness   Lower Extremity Assessment Lower Extremity Assessment: Defer to PT evaluation        Vision   Additional Comments: 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   Perception Perception Perception: Not tested   Praxis Praxis Praxis: Not tested    Cognition Arousal/Alertness: Awake/alert Behavior During Therapy: WFL for tasks assessed/performed Overall Cognitive Status: Within Functional Limits for tasks assessed  Exercises      Shoulder Instructions       General Comments SpO2 92-93% on RA throughout session, family present to interpret    Pertinent Vitals/ Pain       Pain Assessment Pain Assessment:  No/denies pain  Home Living                                          Prior Functioning/Environment              Frequency  Min 2X/week        Progress Toward Goals  OT Goals(current goals can now be found in the care plan section)  Progress towards OT goals: Progressing toward goals  Acute Rehab OT Goals Patient Stated Goal: none stated OT Goal Formulation: With patient Time For Goal Achievement: 05/02/22 Potential to Achieve Goals: Good ADL Goals Pt Will Perform Upper Body Dressing: with modified independence;sitting Pt Will Perform Lower Body Dressing: with modified independence;sitting/lateral leans;sit to/from stand Pt Will Transfer to Toilet: with modified independence;ambulating;bedside commode;regular height toilet Additional ADL Goal #1: Pt will verbalize 3 energy conservation strategies in prep for ADLs  Plan Discharge plan remains appropriate;Frequency remains appropriate    Co-evaluation                 AM-PAC OT "6 Clicks" Daily Activity     Outcome Measure   Help from another person eating meals?: None Help from another person taking care of personal grooming?: A Little Help from another person toileting, which includes using toliet, bedpan, or urinal?: A Little Help from another person bathing (including washing, rinsing, drying)?: A Lot Help from another person to put on and taking off regular upper body clothing?: A Little Help from another person to put on and taking off regular lower body clothing?: A Lot 6 Click Score: 17    End of Session Equipment Utilized During Treatment: Gait belt;Rollator (4 wheels)  OT Visit Diagnosis: Unsteadiness on feet (R26.81);Other abnormalities of gait and mobility (R26.89);Muscle weakness (generalized) (M62.81)   Activity Tolerance Patient tolerated treatment well   Patient Left in bed;with call bell/phone within reach;with family/visitor present   Nurse Communication Mobility  status        Time: [id number] OT Time Calculation (min): 23 min  Charges: OT General Charges $OT Visit: 1 Visit OT Treatments $Self Care/Home Management : 8-22 mins $Therapeutic Activity: 8-22 mins  Renaye Rakers, OTD, OTR/L SecureChat Preferred Acute Rehab [phone] - [place] 04/20/2022, 10:25 AM

## 2022-04-20 NOTE — Progress Notes (Signed)
Physical Therapy Treatment Patient Details Name: Chelsea English MRN: [id number] DOB: 05-16-1960 Today's Date: 04/20/2022   History of Present Illness Pt is a 62 y/o F presenting to ED on 2/23 with SOB and worsening BLE edema. CT chest with bilateral small pleural effusions. Admitted for acute on chronic CHF. PMH includes DM, HTN, moderate aortic stenosis, DM2, CKD IIIB    PT Comments    Pt tolerated treatment well today. Pt was able to ambulate in hallway with supervision and no AD. Pt was also able to navigate stairs at supervision level. Pt has met all acute PT goals and is adequate for DC. No change in DC/DME recs. Reconsult PT if mobility status changes.  Recommendations for follow up therapy are one component of a multi-disciplinary discharge planning process, led by the attending physician.  Recommendations may be updated based on patient status, additional functional criteria and insurance authorization.  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) Restrictions Weight Bearing Restrictions: No     Mobility  Bed Mobility Overal bed mobility: Modified Independent Bed Mobility: Supine to Sit     Supine to sit: Modified independent (Device/Increase time)          Transfers Overall transfer level: Independent Equipment used: None Transfers: Sit to/from Stand Sit to Stand: Independent                Ambulation/Gait Ambulation/Gait assistance: Supervision Gait Distance  (Feet): 200 Feet (1 seated rest break) Assistive device: None Gait Pattern/deviations: Step-through pattern, Decreased stride length, Wide base of support, Decreased step length - right, Decreased step length - left Gait velocity: decreased     General Gait Details: Pt with slow cautious gait however no LOB noted   Stairs Stairs: Yes Stairs assistance: Supervision Stair Management: One rail Right, Alternating pattern, Forwards Number of Stairs: 15     Wheelchair Mobility    Modified Rankin (Stroke Patients Only)       Balance Overall balance assessment: No apparent balance deficits (not formally assessed)                                          Cognition Arousal/Alertness: Awake/alert Behavior During Therapy: WFL for tasks assessed/performed Overall Cognitive Status: Within Functional Limits for tasks assessed                                          Exercises      General Comments General comments (skin integrity, edema, etc.): VSS on RA      Pertinent Vitals/Pain Pain Assessment Pain Assessment: No/denies pain    Home Living                          Prior Function            PT Goals (current goals can now be found in the  care plan section) Progress towards PT goals: Goals met/education completed, patient discharged from PT    Frequency    Other (Comment) (DC)      PT Plan Other (comment) (DC)    Co-evaluation              AM-PAC PT "6 Clicks" Mobility   Outcome Measure  Help needed turning from your back to your side while in a flat bed without using bedrails?: None Help needed moving from lying on your back to sitting on the side of a flat bed without using bedrails?: None Help needed moving to and from a bed to a chair (including a wheelchair)?: None Help needed standing up from a chair using your arms (e.g., wheelchair or bedside chair)?: None Help needed to walk in hospital room?:  None Help needed climbing 3-5 steps with a railing? : None 6 Click Score: 24    End of Session Equipment Utilized During Treatment: Gait belt Activity Tolerance: Patient tolerated treatment well;Patient limited by fatigue Patient left: in bed;with call bell/phone within reach;with nursing/sitter in room;with family/visitor present (Seated EOB) Nurse Communication: Mobility status PT Visit Diagnosis: Muscle weakness (generalized) (M62.81)     Time: [id number] PT Time Calculation (min) (ACUTE ONLY): 17 min  Charges:  $Gait Training: 8-22 mins                     Shelby Mattocks, PT, DPT Acute Rehab Services [id number]    Viann Shove 04/20/2022, 12:00 PM

## 2022-04-20 NOTE — Discharge Summary (Addendum)
Physician Discharge Summary  Chelsea English [id number] DOB: Jun 21, 1960 DOA: 04/15/2022  PCP: Chelsea Debar, PA-C  Admit date: 04/15/2022 Discharge date: 04/20/2022  Time spent: 35 minutes  Recommendations for Outpatient Follow-up:  Cardiology Dr. Virgina English in 2 weeks T CTS Dr. Lavonna English for TAVR eval on 3/4 Pulmonary referral sent for sleep study   Discharge Diagnoses:  Principal Problem:   Acute on chronic diastolic CHF (congestive heart failure) (HCC) Severe aortic stenosis   Essential hypertension   Stage 3b chronic kidney disease (CKD) ([place])   Type 2 diabetes mellitus with hyperlipidemia ([place])   Class 2 obesity   Discharge Condition: Improved  Diet recommendation: Low-sodium, diabetic, heart healthy  Filed Weights   04/18/22 0040 04/19/22 0625 04/20/22 0500  Weight: 88.8 kg 88.4 kg 88.8 kg    History of present illness:  [name] w/ Moderate aortic stenosis, T2DM, CKD stage 3b and dyslipidemia who presented with dyspnea and edema. X  2 days. In ED,  in respiratory distress and was placed on Bipap, glucose 192 bun 41 cr 1,66, BNP 100.7, CXR with cardiomegaly, bilateral hilar vascular congestion with bilateral interstitial infiltrates with cephalization of the vasculature, bilateral pleural effusions. CT chest with bilateral ground glass opacities, with bilateral small pleural effusions.  6 mm nodule right upper lung nodule non calcified. -2/25 patient has responded well to diuretics Echocardiogram with severe aortic stenosis.   Hospital Course:  Severe Aortic Stenosis Acute on chronic diastolic CHF  -Echo LV EF 60 to 65%, moderate LVH, RV systolic function preserved, severe aortic valve stenosis.  -Started on carvedilol.  -diuresed with IV lasix she is 8.5 L negative, GDMT limited by CKD -Cath for TAVR workup w/ no significant CAD, TAVR referral sent, she has a follow-up with Dr. Lavonna English T CTS on 3/4 -Discharged home on oral Lasix 20 Mg daily, with  instructions to call cards office for increased swelling/weight gain   Troponin elevation  -due to heart failure exacerbation, ruled out for acute coronary syndrome.    Acute hypoxemic respiratory failure  -due to acute cardiogenic pulmonary edema.  -Resolved, weaned off O2 during the day however did have overnight desaturations, concern for sleep apnea, sent request for outpatient sleep study and ordered nightly oxygen at home   Essential hypertension Continue carvedilol   Stage 3b chronic kidney disease (CKD) (HCC) AKI, hyponatremia.  -baseline creat 1.5-1.7 -Creatinine peaked at 1.8, now 1.7 and stable, losartan discontinued, Lasix resumed at discharge -Avoid hypotension and nephrotoxic medications.    Anemia of chronic renal disease,  -stable.    Type 2 diabetes mellitus with hyperlipidemia (HCC) T2DM with uncontrolled hyperglycemia.  -Insulin 70/30 continued, metformin discontinued on account of GFR   Class 2 obesity Calculated BMI is 35.69    Discharge Exam: Vitals:   04/20/22 0525 04/20/22 0758  BP: (!) 111/94 127/67  Pulse: 99 73  Resp: 20 16  Temp: 98.1 F (36.7 C) 97.7 F (36.5 C)  SpO2: 97% 100%   Gen: Awake, Alert, Oriented X 3,  HEENT: no JVD Lungs: Good air movement bilaterally, CTAB CVS: [id number] systolic murmur Abd: soft, Non tender, non distended, BS present Extremities: No edema Skin: no new rashes on exposed skin  Discharge Instructions   Discharge Instructions     Ambulatory referral to Pulmonology   Complete by: As directed    Needs Urgent Sleep study   Reason for referral: Other   Diet - low sodium heart healthy   Complete by: As directed    Increase activity slowly  Complete by: As directed       Allergies as of 04/20/2022       Reactions   Penicillins Anaphylaxis        Medication List     STOP taking these medications    amLODipine 5 MG tablet Commonly known as: NORVASC   losartan 50 MG tablet Commonly known as:  COZAAR       TAKE these medications    aspirin 81 MG chewable tablet Chew 81 mg by mouth daily.   atorvastatin 40 MG tablet Commonly known as: LIPITOR Take 40 mg by mouth at bedtime.   BD Insulin Syringe U/F 31G X 5/16" 1 ML Misc Generic drug: Insulin Syringe-Needle U-100 Inject 1 each into the skin 3 (three) times daily. Use to inject unsulin   benzonatate 100 MG capsule Commonly known as: TESSALON Take 100 mg by mouth 2 (two) times daily as needed for cough.   carvedilol 3.125 MG tablet Commonly known as: COREG Take 1 tablet (3.125 mg total) by mouth 2 (two) times daily with a meal.   cyclobenzaprine 5 MG tablet Commonly known as: FLEXERIL Take 10 mg by mouth 2 (two) times daily as needed for muscle spasms.   ferrous sulfate 325 (65 FE) MG tablet Take 650 mg by mouth daily with breakfast.   furosemide 20 MG tablet Commonly known as: Lasix Take 1 tablet (20 mg total) by mouth daily. If you notice increased swelling or weight gain, pls call Dr.Patwardhan (Cardiologist's) office, you may need extra dose of Lasix   gabapentin 100 MG capsule Commonly known as: NEURONTIN Take 100 mg by mouth daily.   HumuLIN 70/30 (70-30) 100 UNIT/ML injection Generic drug: insulin NPH-regular Human Inject 40-60 Units into the skin See admin instructions. Injecting 60 units in the morning and 40 units at bedtime.   Januvia 100 MG tablet Generic drug: sitaGLIPtin Take 100 mg by mouth daily.   metFORMIN 500 MG tablet Commonly known as: GLUCOPHAGE Take 500 mg by mouth 2 (two) times daily.   MULTI FOR HER 50+ PO Take 1 tablet by mouth daily.       Allergies  Allergen Reactions   Penicillins Anaphylaxis      The results of significant diagnostics from this hospitalization (including imaging, microbiology, ancillary and laboratory) are listed below for reference.    Significant Diagnostic Studies: CARDIAC CATHETERIZATION  Result Date: 04/19/2022 Images from the original  result were not included. LM: Normal LAD: No significant disease Lcx: Mid 20% disease RCA: Mid 20% disease RA: 9 mmHg RV: 45/6 mmHg PA: 37/16 mmHg, mPAP 25 mmHg PCW: 14 mmHg CO: 6.8 L/min CI: 3.5 L/min/m2 No significant coronary artery disease Will refer for TAVR [name], MD Pager: [phone] Office: [phone]  ECHOCARDIOGRAM COMPLETE  Result Date: 04/16/2022    ECHOCARDIOGRAM REPORT   Patient Name:   Chelsea English Date of Exam: 04/16/2022 Medical Rec #:  [id number]      Height:       63.0 in Accession #:    [id number]     Weight:       201.5 lb Date of Birth:  1960/05/29      BSA:          1.940 m Patient Age:    68 years       BP:           135/76 mmHg Patient Gender: F              HR:  75 bpm. Exam Location:  Inpatient Procedure: 2D Echo, Cardiac Doppler and Color Doppler Indications:    acute diastolic chf  History:        Patient has no prior history of Echocardiogram examinations.                 CHF, chronic kidney disease; Risk Factors:Hypertension and                 Diabetes.  Sonographer:    Johny Chess RDCS Referring Phys: [id number] [place]  1. The aortic valve is tricuspid. There is severe calcifcation of the aortic valve. There is severe thickening of the aortic valve. Aortic valve regurgitation is trivial. Severe aortic valve stenosis with AVA 0.71cm2, Aortic valve mean gradient measures  47.0 mmHg. Aortic valve Vmax measures 4.44 m/s. DI 0.23.  2. Left ventricular ejection fraction, by estimation, is 60 to 65%. The left ventricle has normal function. The left ventricle has no regional wall motion abnormalities. There is moderate concentric left ventricular hypertrophy. Left ventricular diastolic parameters are consistent with Grade I diastolic dysfunction (impaired relaxation).  3. Right ventricular systolic function is normal. The right ventricular size is normal. There is normal pulmonary artery systolic pressure. The estimated right  ventricular systolic pressure is 0000000 mmHg.  4. Left atrial size was mildly dilated.  5. The mitral valve is degenerative. Trivial mitral valve regurgitation.  6. The inferior vena cava is normal in size with greater than 50% respiratory variability, suggesting right atrial pressure of 3 mmHg. Comparison(s): No prior Echocardiogram. FINDINGS  Left Ventricle: Left ventricular ejection fraction, by estimation, is 60 to 65%. The left ventricle has normal function. The left ventricle has no regional wall motion abnormalities. The left ventricular internal cavity size was normal in size. There is  moderate concentric left ventricular hypertrophy. Left ventricular diastolic parameters are consistent with Grade I diastolic dysfunction (impaired relaxation). Right Ventricle: The right ventricular size is normal. No increase in right ventricular wall thickness. Right ventricular systolic function is normal. There is normal pulmonary artery systolic pressure. The tricuspid regurgitant velocity is 2.55 m/s, and  with an assumed right atrial pressure of 3 mmHg, the estimated right ventricular systolic pressure is 0000000 mmHg. Left Atrium: Left atrial size was mildly dilated. Right Atrium: Right atrial size was normal in size. Pericardium: There is no evidence of pericardial effusion. Mitral Valve: The mitral valve is degenerative in appearance. There is mild thickening of the mitral valve leaflet(s). There is mild calcification of the mitral valve leaflet(s). Trivial mitral valve regurgitation. Tricuspid Valve: The tricuspid valve is normal in structure. Tricuspid valve regurgitation is trivial. Aortic Valve: AVA 0.71cm2, DI 0.23. The aortic valve is tricuspid. There is severe calcifcation of the aortic valve. There is severe thickening of the aortic valve. Aortic valve regurgitation is trivial. Severe aortic stenosis is present. Aortic valve mean gradient measures 47.0 mmHg. Aortic valve peak gradient measures 78.9 mmHg. Aortic  valve area, by VTI measures 0.48 cm. Pulmonic Valve: The pulmonic valve was normal in structure. Pulmonic valve regurgitation is trivial. Aorta: The aortic root and ascending aorta are structurally normal, with no evidence of dilitation. Venous: The inferior vena cava is normal in size with greater than 50% respiratory variability, suggesting right atrial pressure of 3 mmHg. IAS/Shunts: The atrial septum is grossly normal.  LEFT VENTRICLE PLAX 2D LVIDd:         5.00 cm LVIDs:         3.40 cm LV PW:  1.40 cm LV IVS:        1.30 cm LVOT diam:     1.70 cm LV SV:         50 LV SV Index:   26 LVOT Area:     2.27 cm  RIGHT VENTRICLE             IVC RV Basal diam:  2.40 cm     IVC diam: 1.80 cm RV S prime:     13.70 cm/s TAPSE (M-mode): 2.1 cm LEFT ATRIUM             Index        RIGHT ATRIUM           Index LA diam:        4.40 cm 2.27 cm/m   RA Area:     13.30 cm LA Vol (A2C):   61.6 ml 31.76 ml/m  RA Volume:   32.10 ml  16.55 ml/m LA Vol (A4C):   59.7 ml 30.78 ml/m LA Biplane Vol: 64.2 ml 33.10 ml/m  AORTIC VALVE AV Area (Vmax):    0.51 cm AV Area (Vmean):   0.50 cm AV Area (VTI):     0.48 cm AV Vmax:           444.00 cm/s AV Vmean:          322.000 cm/s AV VTI:            1.040 m AV Peak Grad:      78.9 mmHg AV Mean Grad:      47.0 mmHg LVOT Vmax:         99.45 cm/s LVOT Vmean:        71.300 cm/s LVOT VTI:          0.222 m LVOT/AV VTI ratio: 0.21  AORTA Ao Root diam: 2.60 cm Ao Asc diam:  3.10 cm MITRAL VALVE                TRICUSPID VALVE MV Area (PHT): 3.65 cm     TR Peak grad:   26.0 mmHg MV Decel Time: 208 msec     TR Vmax:        255.00 cm/s MV E velocity: 90.10 cm/s MV A velocity: 123.00 cm/s  SHUNTS MV E/A ratio:  0.73         Systemic VTI:  0.22 m                             Systemic Diam: 1.70 cm Gwyndolyn Kaufman MD Electronically signed by Gwyndolyn Kaufman MD Signature Date/Time: 04/16/2022/4:10:15 PM    Final    CT CHEST WO CONTRAST  Result Date: 04/15/2022 CLINICAL DATA:  Severe  shortness of breath.  Hypoxia and tachycardia EXAM: CT CHEST WITHOUT CONTRAST TECHNIQUE: Multidetector CT imaging of the chest was performed following the standard protocol without IV contrast. RADIATION DOSE REDUCTION: This exam was performed according to the departmental dose-optimization program which includes automated exposure control, adjustment of the mA and/or kV according to patient size and/or use of iterative reconstruction technique. COMPARISON:  Chest x-ray earlier 04/15/2022 FINDINGS: Cardiovascular: Heart is slightly enlarged. Trace pericardial fluid. Prominent coronary artery calcifications. There is also significant calcifications along the aortic valve. The thoracic aorta overall otherwise has a normal course and caliber on this noncontrast exam. Significant breathing motion. Mediastinum/Nodes: There are some small but prominent axillary nodes, not pathologic by size criteria. There are several prominent  nodes in the mediastinum. Many of which are not pathologically enlarged but more numerous than usually seen. One larger areas seen right paratracheal near the carina level measuring 2.1 by 1.3 cm. Few enlarged subcarinal nodes as well. The thoracic aorta grossly has a normal course and caliber Lungs/Pleura: Trace bilateral pleural effusions. Adjacent parenchymal opacities are identified. Some dependent atelectasis. However there are more diffuse areas ground-glass, vascular congestion, interstitial septal thickening. This appears somewhat confluence in the lower lobes bilaterally. Based on the overall configuration and favor a more edema rather than infiltrate but infiltrative is still in the differential and would recommend correlate to specific clinical presentation and follow-up. There is also a noncalcified nodule right upper lobe measuring 6 mm on series 4, image 28. Upper Abdomen: Along the upper abdomen the adrenal glands are preserved. Multiple hepatic granulomas are identified. Significant  vascular calcifications in the abdomen. Musculoskeletal: Mild degenerative changes seen along the spine. IMPRESSION: Enlarged heart. Bilateral areas of ground-glass opacity, interstitial septal thickening and more confluent opacity along the lung bases with small effusions. Overall favor these areas to represent edema. Component of infiltrate is difficult to completely exclude and recommend follow-up. 6 mm right upper lobe lung nodule which is noncalcified. Non-contrast chest CT at 6 months is recommended. If the nodule is stable at time of repeat CT, then future CT at 18-24 months (from today's scan) is considered optional for low-risk patients, but is recommended for high-risk patients. This recommendation follows the consensus statement: Guidelines for Management of Incidental Pulmonary Nodules Detected on CT Images: From the Fleischner Society 2017; Radiology 2017; 284:228-243. Scattered vascular calcifications including along the coronary arteries and significant calcification along the aortic valve. Aortic Atherosclerosis (ICD10-I70.0). Electronically Signed   By: Jill Side M.D.   On: 04/15/2022 13:08   DG Chest Port 1 View  Result Date: 04/15/2022 CLINICAL DATA:  SOB EXAM: PORTABLE CHEST 1 VIEW COMPARISON:  None Available. FINDINGS: Enlarged cardiac silhouette. Interstitial and alveolar opacities consistent with pulmonary edema. Probable layering pleural effusions. No pneumothorax identified. Calcified aorta. IMPRESSION: Findings suggest CHF. Electronically Signed   By: Sammie Bench M.D.   On: 04/15/2022 10:31    Microbiology: Recent Results (from the past 240 hour(s))  Resp panel by RT-PCR (RSV, Flu A&B, Covid) Anterior Nasal Swab     Status: None   Collection Time: 04/15/22 10:08 AM   Specimen: Anterior Nasal Swab  Result Value Ref Range Status   SARS Coronavirus 2 by RT PCR NEGATIVE NEGATIVE Final    Comment: (NOTE) SARS-CoV-2 target nucleic acids are NOT DETECTED.  The SARS-CoV-2  RNA is generally detectable in upper respiratory specimens during the acute phase of infection. The lowest concentration of SARS-CoV-2 viral copies this assay can detect is 138 copies/mL. A negative result does not preclude SARS-Cov-2 infection and should not be used as the sole basis for treatment or other patient management decisions. A negative result may occur with  improper specimen collection/handling, submission of specimen other than nasopharyngeal swab, presence of viral mutation(s) within the areas targeted by this assay, and inadequate number of viral copies(<138 copies/mL). A negative result must be combined with clinical observations, patient history, and epidemiological information. The expected result is Negative.  Fact Sheet for Patients:  EntrepreneurPulse.com.au  Fact Sheet for Healthcare Providers:  IncredibleEmployment.be  This test is no t yet approved or cleared by the [place] FDA and  has been authorized for detection and/or diagnosis of SARS-CoV-2 by FDA under an Emergency Use Authorization (EUA). This  EUA will remain  in effect (meaning this test can be used) for the duration of the COVID-19 declaration under Section 564(b)(1) of the Act, 21 U.S.C.section 360bbb-3(b)(1), unless the authorization is terminated  or revoked sooner.       Influenza A by PCR NEGATIVE NEGATIVE Final   Influenza B by PCR NEGATIVE NEGATIVE Final    Comment: (NOTE) The Xpert Xpress SARS-CoV-2/FLU/RSV plus assay is intended as an aid in the diagnosis of influenza from Nasopharyngeal swab specimens and should not be used as a sole basis for treatment. Nasal washings and aspirates are unacceptable for Xpert Xpress SARS-CoV-2/FLU/RSV testing.  Fact Sheet for Patients: EntrepreneurPulse.com.au  Fact Sheet for Healthcare Providers: IncredibleEmployment.be  This test is not yet approved or cleared by the  [place] FDA and has been authorized for detection and/or diagnosis of SARS-CoV-2 by FDA under an Emergency Use Authorization (EUA). This EUA will remain in effect (meaning this test can be used) for the duration of the COVID-19 declaration under Section 564(b)(1) of the Act, 21 U.S.C. section 360bbb-3(b)(1), unless the authorization is terminated or revoked.     Resp Syncytial Virus by PCR NEGATIVE NEGATIVE Final    Comment: (NOTE) Fact Sheet for Patients: EntrepreneurPulse.com.au  Fact Sheet for Healthcare Providers: IncredibleEmployment.be  This test is not yet approved or cleared by the [place] FDA and has been authorized for detection and/or diagnosis of SARS-CoV-2 by FDA under an Emergency Use Authorization (EUA). This EUA will remain in effect (meaning this test can be used) for the duration of the COVID-19 declaration under Section 564(b)(1) of the Act, 21 U.S.C. section 360bbb-3(b)(1), unless the authorization is terminated or revoked.  Performed at [facility], [address] [place], [place] [address]      Labs: Basic Metabolic Panel: Recent Labs  Lab 04/16/22 0056 04/17/22 0048 04/18/22 0040 04/19/22 0045 04/19/22 0745 04/19/22 1332 04/20/22 0349  NA 137 133* 132* 128* 133* 138  139 133*  K 4.9 4.6 4.4 4.1 3.8 4.2  4.0 4.1  CL 103 97* 94* 93* 97*  --  100  CO2 '23 27 26 25 25  '$ --  24  GLUCOSE 224* 327* 322* 237* 197*  --  266*  BUN 39* 48* 69* 71* 67*  --  65*  CREATININE 1.75* 1.90* 2.05* 1.81* 1.74*  --  1.73*  CALCIUM 9.3 9.4 9.4 9.1 9.4  --  8.9  MG 2.0 1.9  --   --   --   --   --   PHOS 4.0  --   --  5.2*  --   --  3.8   Liver Function Tests: Recent Labs  Lab 04/15/22 1006 04/16/22 0056 04/19/22 0045 04/20/22 0349  AST 77* 36  --   --   ALT 65* 55*  --   --   ALKPHOS 162* 122  --   --   BILITOT 1.0 1.1  --   --   PROT 8.6* 7.3  --   --   ALBUMIN 4.2 3.6 3.4* 3.3*   No results  for input(s): "LIPASE", "AMYLASE" in the last 168 hours. No results for input(s): "AMMONIA" in the last 168 hours. CBC: Recent Labs  Lab 04/15/22 1006 04/15/22 1022 04/15/22 1207 04/16/22 0056 04/19/22 0745 04/19/22 1332  WBC 19.4*  --   --  11.2* 11.7*  --   NEUTROABS 9.1*  --   --  8.3*  --   --   HGB 9.4* 9.9* 8.8*  8.6* 9.1* 9.9*  9.9*  HCT 30.3* 29.0* 26.0* 25.9* 26.6* 29.0*  29.0*  MCV 86.3  --   --  83.0 80.6  --   PLT 248  --   --  209 252  --    Cardiac Enzymes: No results for input(s): "CKTOTAL", "CKMB", "CKMBINDEX", "TROPONINI" in the last 168 hours. BNP: BNP (last 3 results) Recent Labs    04/15/22 1035  BNP 100.7*    ProBNP (last 3 results) No results for input(s): "PROBNP" in the last 8760 hours.  CBG: Recent Labs  Lab 04/19/22 0632 04/19/22 1144 04/19/22 1616 04/19/22 2044 04/20/22 0558  GLUCAP 225* 209* 217* 187* 242*       Signed:  Domenic Polite MD.  [facility] 04/20/2022, 10:26 AM

## 2022-04-22 LAB — LIPOPROTEIN A (LPA): Lipoprotein (a): 410.4 nmol/L — ABNORMAL HIGH (ref <75.0)

## 2022-04-25 ENCOUNTER — Institutional Professional Consult (permissible substitution) (INDEPENDENT_AMBULATORY_CARE_PROVIDER_SITE_OTHER): Payer: Medicaid Other | Admitting: Thoracic Surgery (Cardiothoracic Vascular Surgery)

## 2022-04-25 ENCOUNTER — Encounter: Payer: Self-pay | Admitting: Thoracic Surgery (Cardiothoracic Vascular Surgery)

## 2022-04-25 VITALS — BP 126/72 | HR 94 | Resp 20 | Ht 62.0 in | Wt 195.0 lb

## 2022-04-25 DIAGNOSIS — I350 Nonrheumatic aortic (valve) stenosis: Secondary | ICD-10-CM | POA: Diagnosis not present

## 2022-04-25 NOTE — Patient Instructions (Signed)
TAVR CTA and discuss her candidacy following that

## 2022-04-25 NOTE — Progress Notes (Signed)
[address]       [place] [address]             [phone]           Maximina Hoerner Edwards Medical Record [id number] Date of Birth: 10-08-1960  [name], MD Donnal Debar, PA-C  Chief Complaint:  SOB and AS  History of Present Illness:     Pt is a 62 e[place]ian female living with her daughter who was having increasing SOB and lower ext edema and occasional L breast back pain and was admitted with CHF recently. Work up revealed Normal LV function with severe AS with a mean gradient of 26mHg. She had no CAD by cath. Pt has DM and has chronic blindness from that and also CKD stage 3. She has a Cr that has been around 2 with diuresis. Pt was discharged and by accounts is doing better but still with DOE. She has no lightheadedness nor syncope. Pt interview was performed with her daughter who works as nMarine scientistat [employer] the supplied [employer] Pt has known AS from [facility]     Past Medical History:  Diagnosis Date   Anemia    Aortic stenosis    Coronary artery disease    Diabetes mellitus without complication ([place]    Hyperlipidemia    Hypertension     Past Surgical History:  Procedure Laterality Date   ARTERY BIOPSY Right 07/20/2021   Procedure: BIOPSY OF RIGHT TEMPORAL ARTERY;  Surgeon: DAngelia Mould MD;  [facility];  Service: Vascular;  Laterality: Right;   cataract Right    HYSTEROSCOPY WITH D & C     removal of polyp   MULTIPLE TOOTH EXTRACTIONS     no teeth, no dentures   RIGHT AND LEFT HEART CATH N/A 04/19/2022   Procedure: RIGHT AND LEFT HEART CATH;  Surgeon: PNigel Mormon MD;  [facility];  Service: Cardiovascular;  Laterality: N/A;    Social History   Tobacco Use  Smoking Status Never  Smokeless Tobacco Never    Social History   Substance and Sexual Activity  Alcohol Use Never    Social History   Socioeconomic History   Marital  status: Widowed    Spouse name: Not on file   Number of children: Not on file   Years of education: Not on file   Highest education level: Not on file  Occupational History   Not on file  Tobacco Use   Smoking status: Never   Smokeless tobacco: Never  Vaping Use   Vaping Use: Never used  Substance and Sexual Activity   Alcohol use: Never   Drug use: Never   Sexual activity: Not Currently    Birth control/protection: Post-menopausal  Other Topics Concern   Not on file  Social History Narrative   Not on file   Social Determinants of Health   Financial Resource Strain: Not on file  Food Insecurity: No Food Insecurity (04/15/2022)   Hunger Vital Sign    Worried About Running Out of Food in the Last Year: Never true    Ran Out of Food in the Last Year: Never true  Transportation Needs: No Transportation Needs (04/15/2022)   PRAPARE - Transportation    Lack of Transportation (Medical): No    Lack of Transportation (Non-Medical): No  Physical Activity: Not on file  Stress: Not on file  Social  Connections: Not on file  Intimate Partner Violence: Not At Risk (04/15/2022)   Humiliation, Afraid, Rape, and Kick questionnaire    Fear of Current or Ex-Partner: No    Emotionally Abused: No    Physically Abused: No    Sexually Abused: No    Allergies  Allergen Reactions   Penicillins Anaphylaxis    Current Outpatient Medications  Medication Sig Dispense Refill   aspirin 81 MG chewable tablet Chew 81 mg by mouth daily.     atorvastatin (LIPITOR) 40 MG tablet Take 40 mg by mouth at bedtime.     BD INSULIN SYRINGE U/F 31G X 5/16" 1 ML MISC Inject 1 each into the skin 3 (three) times daily. Use to inject unsulin     benzonatate (TESSALON) 100 MG capsule Take 100 mg by mouth 2 (two) times daily as needed for cough.     carvedilol (COREG) 3.125 MG tablet Take 1 tablet (3.125 mg total) by mouth 2 (two) times daily with a meal. 60 tablet 0   cyclobenzaprine (FLEXERIL) 5 MG tablet Take 10  mg by mouth 2 (two) times daily as needed for muscle spasms.     ferrous sulfate 325 (65 FE) MG tablet Take 650 mg by mouth daily with breakfast.     furosemide (LASIX) 20 MG tablet Take 1 tablet (20 mg total) by mouth daily. If you notice increased swelling or weight gain, pls call Dr.Patwardhan (Cardiologist's) office, you may need extra dose of Lasix 30 tablet 0   gabapentin (NEURONTIN) 100 MG capsule Take 100 mg by mouth daily.     insulin NPH-regular Human (HUMULIN 70/30) (70-30) 100 UNIT/ML injection Inject 40-60 Units into the skin See admin instructions. Injecting 60 units in the morning and 40 units at bedtime.     JANUVIA 100 MG tablet Take 100 mg by mouth daily.     Multiple Vitamins-Minerals (MULTI FOR HER 50+ PO) Take 1 tablet by mouth daily.     No current facility-administered medications for this visit.     No family history on file.     Physical Exam: Teeth appear in good condition Neuro: alert no focal deficits Lungs: decreased at bases Card: RR with 3/6 sem Ext: no edema     Diagnostic Studies & Laboratory data: I have personally reviewed the following studies and agree with the findings   TTE (03/2022) IMPRESSIONS     1. The aortic valve is tricuspid. There is severe calcifcation of the  aortic valve. There is severe thickening of the aortic valve. Aortic valve  regurgitation is trivial. Severe aortic valve stenosis with AVA 0.71cm2,  Aortic valve mean gradient measures   47.0 mmHg. Aortic valve Vmax measures 4.44 m/s. DI 0.23.   2. Left ventricular ejection fraction, by estimation, is 60 to 65%. The  left ventricle has normal function. The left ventricle has no regional  wall motion abnormalities. There is moderate concentric left ventricular  hypertrophy. Left ventricular  diastolic parameters are consistent with Grade I diastolic dysfunction  (impaired relaxation).   3. Right ventricular systolic function is normal. The right ventricular  size is  normal. There is normal pulmonary artery systolic pressure. The  estimated right ventricular systolic pressure is 0000000 mmHg.   4. Left atrial size was mildly dilated.   5. The mitral valve is degenerative. Trivial mitral valve regurgitation.   6. The inferior vena cava is normal in size with greater than 50%  respiratory variability, suggesting right atrial pressure of 3 mmHg.  Comparison(s): No prior Echocardiogram.   FINDINGS   Left Ventricle: Left ventricular ejection fraction, by estimation, is 60  to 65%. The left ventricle has normal function. The left ventricle has no  regional wall motion abnormalities. The left ventricular internal cavity  size was normal in size. There is   moderate concentric left ventricular hypertrophy. Left ventricular  diastolic parameters are consistent with Grade I diastolic dysfunction  (impaired relaxation).   Right Ventricle: The right ventricular size is normal. No increase in  right ventricular wall thickness. Right ventricular systolic function is  normal. There is normal pulmonary artery systolic pressure. The tricuspid  regurgitant velocity is 2.55 m/s, and   with an assumed right atrial pressure of 3 mmHg, the estimated right  ventricular systolic pressure is 0000000 mmHg.   Left Atrium: Left atrial size was mildly dilated.   Right Atrium: Right atrial size was normal in size.   Pericardium: There is no evidence of pericardial effusion.   Mitral Valve: The mitral valve is degenerative in appearance. There is  mild thickening of the mitral valve leaflet(s). There is mild  calcification of the mitral valve leaflet(s). Trivial mitral valve  regurgitation.   Tricuspid Valve: The tricuspid valve is normal in structure. Tricuspid  valve regurgitation is trivial.   Aortic Valve: AVA 0.71cm2, DI 0.23. The aortic valve is tricuspid. There  is severe calcifcation of the aortic valve. There is severe thickening of  the aortic valve. Aortic valve  regurgitation is trivial. Severe aortic  stenosis is present. Aortic valve  mean gradient measures 47.0 mmHg. Aortic valve peak gradient measures 78.9  mmHg. Aortic valve area, by VTI measures 0.48 cm.   Pulmonic Valve: The pulmonic valve was normal in structure. Pulmonic valve  regurgitation is trivial.   Aorta: The aortic root and ascending aorta are structurally normal, with  no evidence of dilitation.   Venous: The inferior vena cava is normal in size with greater than 50%  respiratory variability, suggesting right atrial pressure of 3 mmHg.   IAS/Shunts: The atrial septum is grossly normal.     LEFT VENTRICLE  PLAX 2D  LVIDd:         5.00 cm  LVIDs:         3.40 cm  LV PW:         1.40 cm  LV IVS:        1.30 cm  LVOT diam:     1.70 cm  LV SV:         50  LV SV Index:   26  LVOT Area:     2.27 cm     RIGHT VENTRICLE             IVC  RV Basal diam:  2.40 cm     IVC diam: 1.80 cm  RV S prime:     13.70 cm/s  TAPSE (M-mode): 2.1 cm   LEFT ATRIUM             Index        RIGHT ATRIUM           Index  LA diam:        4.40 cm 2.27 cm/m   RA Area:     13.30 cm  LA Vol (A2C):   61.6 ml 31.76 ml/m  RA Volume:   32.10 ml  16.55 ml/m  LA Vol (A4C):   59.7 ml 30.78 ml/m  LA Biplane Vol: 64.2 ml 33.10 ml/m  AORTIC VALVE  AV Area (Vmax):    0.51 cm  AV Area (Vmean):   0.50 cm  AV Area (VTI):     0.48 cm  AV Vmax:           444.00 cm/s  AV Vmean:          322.000 cm/s  AV VTI:            1.040 m  AV Peak Grad:      78.9 mmHg  AV Mean Grad:      47.0 mmHg  LVOT Vmax:         99.45 cm/s  LVOT Vmean:        71.300 cm/s  LVOT VTI:          0.222 m  LVOT/AV VTI ratio: 0.21    AORTA  Ao Root diam: 2.60 cm  Ao Asc diam:  3.10 cm   MITRAL VALVE                TRICUSPID VALVE  MV Area (PHT): 3.65 cm     TR Peak grad:   26.0 mmHg  MV Decel Time: 208 msec     TR Vmax:        255.00 cm/s  MV E velocity: 90.10 cm/s  MV A velocity: 123.00 cm/s  SHUNTS  MV E/A ratio:   0.73         Systemic VTI:  0.22 m                              Systemic Diam: 1.70 cm   CATH (03/2022) Conclusion  LM: Normal LAD: No significant disease Lcx: Mid 20% disease RCA: Mid 20% disease   RA: 9 mmHg RV: 45/6 mmHg PA: 37/16 mmHg, mPAP 25 mmHg PCW: 14 mmHg   CO: 6.8 L/min CI: 3.5 L/min/m2   No significant coronary artery disease Will refer for TAVR   Recent Radiology Findings:   CT CHEST (03/2022) FINDINGS: Cardiovascular: Heart is slightly enlarged. Trace pericardial fluid. Prominent coronary artery calcifications. There is also significant calcifications along the aortic valve. The thoracic aorta overall otherwise has a normal course and caliber on this noncontrast exam. Significant breathing motion.   Mediastinum/Nodes: There are some small but prominent axillary nodes, not pathologic by size criteria. There are several prominent nodes in the mediastinum. Many of which are not pathologically enlarged but more numerous than usually seen. One larger areas seen right paratracheal near the carina level measuring 2.1 by 1.3 cm. Few enlarged subcarinal nodes as well. The thoracic aorta grossly has a normal course and caliber   Lungs/Pleura: Trace bilateral pleural effusions. Adjacent parenchymal opacities are identified. Some dependent atelectasis. However there are more diffuse areas ground-glass, vascular congestion, interstitial septal thickening. This appears somewhat confluence in the lower lobes bilaterally. Based on the overall configuration and favor a more edema rather than infiltrate but infiltrative is still in the differential and would recommend correlate to specific clinical presentation and follow-up. There is also a noncalcified nodule right upper lobe measuring 6 mm on series 4, image 28.   Upper Abdomen: Along the upper abdomen the adrenal glands are preserved. Multiple hepatic granulomas are identified. Significant vascular calcifications in  the abdomen.   Musculoskeletal: Mild degenerative changes seen along the spine.   IMPRESSION: Enlarged heart. Bilateral areas of ground-glass opacity, interstitial septal thickening and more confluent opacity along the lung bases with small effusions. Overall  favor these areas to represent edema. Component of infiltrate is difficult to completely exclude and recommend follow-up.   6 mm right upper lobe lung nodule which is noncalcified. Non-contrast chest CT at 6 months is recommended. If the nodule is stable at time of repeat CT, then future CT at 18-24 months (from today's scan) is considered optional for low-risk patients, but is recommended for high-risk patients. This recommendation follows the consensus statement: Guidelines for Management of Incidental Pulmonary Nodules Detected on CT Images: From the Fleischner Society 2017; Radiology 2017; 284:228-243.   Scattered vascular calcifications including along the coronary arteries and significant calcification along the aortic valve.   Aortic Atherosclerosis (ICD10-I70.0).      Recent Lab Findings: Lab Results  Component Value Date   WBC 11.7 (H) 04/19/2022   HGB 9.9 (L) 04/19/2022   HGB 9.9 (L) 04/19/2022   HCT 29.0 (L) 04/19/2022   HCT 29.0 (L) 04/19/2022   PLT 252 04/19/2022   GLUCOSE 266 (H) 04/20/2022   CHOL 152 07/18/2021   TRIG 156 (H) 07/18/2021   HDL 27 (L) 07/18/2021   LDLCALC 94 07/18/2021   ALT 55 (H) 04/16/2022   AST 36 04/16/2022   NA 133 (L) 04/20/2022   K 4.1 04/20/2022   CL 100 04/20/2022   CREATININE 1.73 (H) 04/20/2022   BUN 65 (H) 04/20/2022   CO2 24 04/20/2022   TSH 1.502 07/18/2021   INR 1.2 04/16/2022   HGBA1C 6.8 (H) 04/15/2022      Assessment / Plan:     62 yo female with NYHA class 2 symptoms of severe AS with normal LV function and no CAD. Pt comorbidities include blindness from DM and CRI. She with the interpreter and daughter had a discussion about her need for AVR. She was  given the risks of SAVR vs TAVR and explained at her age she would most likely have best long term results with SAVR but it is a more risk surgery due to her renal insufficiency and DM retinopathy. It appears from discussion she understands this and has decided that she does not wish to have better long term results and would rather have TAVR. She will need TAVR CTA to confirm her anatomy is ok with TAVR. Will let TAVR team know and move forward following TAVR CTA   I have spent 60 min in review of the records, viewing studies and in face to face with patient and in coordination of future care    Coralie Common 04/25/2022 9:04 AM

## 2022-04-26 ENCOUNTER — Other Ambulatory Visit: Payer: Self-pay

## 2022-04-27 ENCOUNTER — Other Ambulatory Visit: Payer: Self-pay

## 2022-04-27 DIAGNOSIS — I350 Nonrheumatic aortic (valve) stenosis: Secondary | ICD-10-CM

## 2022-05-03 ENCOUNTER — Ambulatory Visit (HOSPITAL_COMMUNITY)
Admission: RE | Admit: 2022-05-03 | Discharge: 2022-05-03 | Disposition: A | Discharge status: Home / Self Care | Payer: Medicaid Other | Source: Ambulatory Visit | Attending: Internal Medicine | Admitting: Internal Medicine

## 2022-05-03 DIAGNOSIS — I350 Nonrheumatic aortic (valve) stenosis: Secondary | ICD-10-CM

## 2022-05-03 MED ORDER — IOHEXOL 350 MG/ML SOLN
80.0000 mL | Freq: Once | INTRAVENOUS | Status: AC | PRN
  Administered 2022-05-03: 80 mL via INTRAVENOUS

## 2022-05-03 MED ORDER — SODIUM CHLORIDE 0.9 % WEIGHT BASED INFUSION: 1.0000 mL/kg/h | INTRAVENOUS | Status: DC

## 2022-05-03 MED ORDER — SODIUM CHLORIDE 0.9 % WEIGHT BASED INFUSION
3.0000 mL/kg/h | INTRAVENOUS | Status: AC
  Administered 2022-05-03: 3 mL/kg/h via INTRAVENOUS

## 2022-05-08 NOTE — Progress Notes (Unsigned)
Patient ID: Chelsea English MRN: [id number] DOB/AGE: 1960/03/29 62 y.o.  Primary Care Physician:[place], Rosanna Randy, PA-C Primary Cardiologist: Vernell Leep, MD  FOCUSED CARDIOVASCULAR PROBLEM LIST:   1.  Severe aortic stenosis with an aortic valve area of 0.71 cm, mean gradient 47 mmHg, and peak velocity of 4.4 m/s with a normal ejection fraction; EKG demonstrates sinus rhythm without bundle-branch blocks 2.  Type 2 diabetes on insulin c/b nephropathy and retinopathy 3.  Hypertension 4.  Hyperlipidemia 5.  BMI of 33 6.  CKD stage III 7.  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  BMI of 33  HISTORY OF PRESENT ILLNESS: The patient is a 62 y.o. female with the indicated medical history here for recommendations regarding her severe aortic stenosis.  The patient was admitted to the hospital recently with an acute diastolic heart failure exacerbation.  She was medically stabilized.  Echocardiography demonstrated severe aortic stenosis with preserved ejection fraction.  Coronary angiography demonstrated no significant obstructive disease.  She was optimized from medical standpoint and discharged.  She saw Dr. Lavonna Monarch recently recommend follow-up because of her multiple comorbidities.  Given her young age she was thought to be best served by a surgical aortic valve replacement however the patient was not willing to pursue this approach.  The patient is here with 2 of her daughters.  She tells me she has been feeling fairly good since being discharged from the hospital.  She denies any chest pain, shortness of breath, presyncope, or syncope.  She did have issues with orthopnea prior to admission but this is much better.  She has been completely compliant with her medications.  She fortunately has not required any emergency room visits or hospitalizations.  She does snore and suffers from daytime somnolence.  She is scheduled to see sleep medicine tomorrow.  She has not seen a dentist in  some time.  She denies any dental pain.   Past Medical History:  Diagnosis Date   Anemia    Aortic stenosis    Coronary artery disease    Diabetes mellitus without complication ([place])    Hyperlipidemia    Hypertension     Past Surgical History:  Procedure Laterality Date   ARTERY BIOPSY Right 07/20/2021   Procedure: BIOPSY OF RIGHT TEMPORAL ARTERY;  Surgeon: Angelia Mould, MD;  Location: [facility] OR;  Service: Vascular;  Laterality: Right;   cataract Right    HYSTEROSCOPY WITH D & C     removal of polyp   MULTIPLE TOOTH EXTRACTIONS     no teeth, no dentures   RIGHT AND LEFT HEART CATH N/A 04/19/2022   Procedure: RIGHT AND LEFT HEART CATH;  Surgeon: [name], MD;  Location: [facility];  Service: Cardiovascular;  Laterality: N/A;    History reviewed. No pertinent family history.  Social History   Socioeconomic History   Marital status: Widowed    Spouse name: Not on file   Number of children: Not on file   Years of education: Not on file   Highest education level: Not on file  Occupational History   Not on file  Tobacco Use   Smoking status: Never   Smokeless tobacco: Never  Vaping Use   Vaping Use: Never used  Substance and Sexual Activity   Alcohol use: Never   Drug use: Never   Sexual activity: Not Currently    Birth control/protection: Post-menopausal  Other Topics Concern   Not on file  Social History Narrative   Not  on file   Social Determinants of Health   Financial Resource Strain: Not on file  Food Insecurity: No Food Insecurity (04/15/2022)   Hunger Vital Sign    Worried About Running Out of Food in the Last Year: Never true    Ran Out of Food in the Last Year: Never true  Transportation Needs: No Transportation Needs (04/15/2022)   PRAPARE - Hydrologist (Medical): No    Lack of Transportation (Non-Medical): No  Physical Activity: Not on file  Stress: Not on file  Social Connections: Not on file   Intimate Partner Violence: Not At Risk (04/15/2022)   Humiliation, Afraid, Rape, and Kick questionnaire    Fear of Current or Ex-Partner: No    Emotionally Abused: No    Physically Abused: No    Sexually Abused: No     Prior to Admission medications   Medication Sig Start Date End Date Taking? Authorizing Provider  aspirin 81 MG chewable tablet Chew 81 mg by mouth daily. 07/06/18   [provider]  atorvastatin (LIPITOR) 40 MG tablet Take 40 mg by mouth at bedtime. 06/01/21   [provider]  BD INSULIN SYRINGE U/F 31G X 5/16" 1 ML MISC Inject 1 each into the skin 3 (three) times daily. Use to inject unsulin 02/07/22   [provider]  benzonatate (TESSALON) 100 MG capsule Take 100 mg by mouth 2 (two) times daily as needed for cough. 07/12/21   [provider]  carvedilol (COREG) 3.125 MG tablet Take 1 tablet (3.125 mg total) by mouth 2 (two) times daily with a meal. 04/20/22   Domenic Polite, MD  cyclobenzaprine (FLEXERIL) 5 MG tablet Take 10 mg by mouth 2 (two) times daily as needed for muscle spasms. 07/06/21   [provider]  ferrous sulfate 325 (65 FE) MG tablet Take 650 mg by mouth daily with breakfast.    [provider]  furosemide (LASIX) 20 MG tablet Take 1 tablet (20 mg total) by mouth daily. If you notice increased swelling or weight gain, pls call Dr.Patwardhan (Cardiologist's) office, you may need extra dose of Lasix 04/20/22   Domenic Polite, MD  gabapentin (NEURONTIN) 100 MG capsule Take 100 mg by mouth daily. 07/06/21   [provider]  insulin NPH-regular Human (HUMULIN 70/30) (70-30) 100 UNIT/ML injection Inject 40-60 Units into the skin See admin instructions. Injecting 60 units in the morning and 40 units at bedtime. 10/20/17   [provider]  JANUVIA 100 MG tablet Take 100 mg by mouth daily. 07/05/21   [provider]  Multiple Vitamins-Minerals (MULTI FOR HER 50+ PO) Take 1 tablet by mouth daily.     [provider]    Allergies  Allergen Reactions   Penicillins Anaphylaxis    REVIEW OF SYSTEMS:  General: no fevers/chills/night sweats Eyes: no blurry vision, diplopia, or amaurosis ENT: no sore throat or hearing loss Resp: no cough, wheezing, or hemoptysis CV: no edema or palpitations GI: no abdominal pain, nausea, vomiting, diarrhea, or constipation GU: no dysuria, frequency, or hematuria Skin: no rash Neuro: no headache, numbness, tingling, or weakness of extremities Musculoskeletal: no joint pain or swelling Heme: no bleeding, DVT, or easy bruising Endo: no polydipsia or polyuria  BP 130/80   Pulse 66   Ht 5\' 2"  (1.575 m)   Wt 201 lb 12.8 oz (91.5 kg)   SpO2 99%   BMI 36.91 kg/m   PHYSICAL EXAM: GEN:  AO x 3 in no  acute distress HEENT: normal Dentition: Poor Neck: JVP normal. +2 carotid upstrokes without bruits. No thyromegaly. Lungs: equal expansion, clear bilaterally CV: Apex is discrete and nondisplaced, RRR with 3/6 SEM Abd: soft, non-tender, non-distended; no bruit; positive bowel sounds Ext: no edema, ecchymoses, or cyanosis Vascular: 2+ femoral pulses, 2+ radial pulses       Skin: warm and dry without rash Neuro: CN II-XII grossly intact; motor and sensory grossly intact    DATA AND STUDIES:  EKG:  SR without conduction deficits  2D ECHO: February 2024 1. The aortic valve is tricuspid. There is severe calcifcation of the  aortic valve. There is severe thickening of the aortic valve. Aortic valve  regurgitation is trivial. Severe aortic valve stenosis with AVA 0.71cm2,  Aortic valve mean gradient measures   47.0 mmHg. Aortic valve Vmax measures 4.44 m/s. DI 0.23.   2. Left ventricular ejection fraction, by estimation, is 60 to 65%. The  left ventricle has normal function. The left ventricle has no regional  wall motion abnormalities. There is moderate concentric left ventricular  hypertrophy. Left ventricular  diastolic parameters are  consistent with Grade I diastolic dysfunction  (impaired relaxation).   3. Right ventricular systolic function is normal. The right ventricular  size is normal. There is normal pulmonary artery systolic pressure. The  estimated right ventricular systolic pressure is 0000000 mmHg.   4. Left atrial size was mildly dilated.   5. The mitral valve is degenerative. Trivial mitral valve regurgitation.   6. The inferior vena cava is normal in size with greater than 50%  respiratory variability, suggesting right atrial pressure of 3 mmHg.   CARDIAC CATH: February 2024  RA: 9 mmHg RV: 45/6 mmHg PA: 37/16 mmHg, mPAP 25 mmHg PCW: 14 mmHg   CO: 6.8 L/min CI: 3.5 L/min/m2   No significant coronary artery disease  CTA: 1. Tricuspid aortic valve with severely reduced cusp excursion. Severely thickened and severely calcified aortic valve cusps.   2.  Aortic valve calcium score: 1203   3. Annulus area: 399 mm2, suitable for 23 mm Sapien 3 valve. No LVOT calcifications. Membranous septal length 4.5 mm.   4.  Sufficient coronary artery heights from annulus.   5.  Optimum fluoroscopic angle for delivery: LAO 1 CAU 1  6.  6 mm right upper lobe lung nodule which is noncalcified. Non-contrast chest CT at 6 months is recommended. If the nodule is stable at time of repeat CT, then future CT at 18-24 months (from today's scan) is considered optional for low-risk patients, but is recommended for high-risk patients.  STS RISK CALCULATOR: Pending  NHYA CLASS: 2    ASSESSMENT AND PLAN:   Severe aortic stenosis  Type 2 diabetes mellitus with complication, with long-term current use of insulin (HCC)  Hypertension associated with diabetes ([place])  Hyperlipidemia associated with type 2 diabetes mellitus (HCC)  Stage 3b chronic kidney disease (HCC)  BMI 33.0-33.9,adult  Pulmonary nodule  I had a long conversation with the patient and her family members.  Given her young age I strongly  recommended that surgical aortic valve replacement be pursued.  This was also the recommendation of Dr. Lavonna Monarch.  I discussed that I reviewed the CT scan that was done which showed of fairly inhospitable aortic root in case a valve in valve TAVR were to be needed in the future after an initial TAVR procedure.  Her risk plane is fairly concerning in conjunction with her effaced sinuses.  On further discussion with the patient  and her family she would like 1 procedure done without any future cardiac valvular procedures.  With this in mind an aortic valve replacement with mechanical valve seems to be a reasonable option for her.  I did discuss this with Dr. Lavonna Monarch.  She is relatively older than the typical patient that a mechanical valve would be considered however in this particular situation this may be the best option.  I do not think an initial TAVR procedure is in the patient's best interest.  I did broach the issue of the patient's longevity after TAVR procedure.  The patient and her family are willing to pursue the best possible therapy.  They were under the impression that following an initial TAVR procedure, structural valve degeneration could be easily remedied with subsequent valve in valve TAVR's.  I told them that this is probably not the case based on my review of the CT scan and her relatively effaced sinuses.  After quite a lengthy discussion regarding the important issues including worsening kidney function, the patient and her family seem to be leaning towards a surgical aortic valve replacement with a mechanical prosthesis.  I will see the patient next week as she would like to discuss with more of her family.  I discussed the relevant details of this meeting with Dr. Lavonna Monarch.  We will refer the patient for a dental evaluation.  I did mention to the patient and her family that an incidental pulmonary nodule was seen on her CT scan that will require follow-up imaging in the future.  I have  personally reviewed the patients imaging data as summarized above.  I have reviewed the natural history of aortic stenosis with the patient and family members who are present today. We have discussed the limitations of medical therapy and the poor prognosis associated with symptomatic aortic stenosis. We have also reviewed potential treatment options, including palliative medical therapy, conventional surgical aortic valve replacement, and transcatheter aortic valve replacement. We discussed treatment options in the context of this patient's specific comorbid medical conditions.   All of the patient's questions were answered today. Will make further recommendations based on the results of studies outlined above.   Total time spent with patient today 80 minutes. This includes reviewing records, evaluating the patient and coordinating care.   Early Osmond, MD  05/10/2022 5:28 PM    [facility] [place], [place], [place]  [address] Phone: [phone]; Fax: [phone]

## 2022-05-10 ENCOUNTER — Ambulatory Visit: Payer: Medicaid Other | Attending: Internal Medicine | Admitting: Internal Medicine

## 2022-05-10 ENCOUNTER — Encounter: Payer: Self-pay | Admitting: Internal Medicine

## 2022-05-10 VITALS — BP 130/80 | HR 66 | Ht 62.0 in | Wt 201.8 lb

## 2022-05-10 DIAGNOSIS — E118 Type 2 diabetes mellitus with unspecified complications: Secondary | ICD-10-CM | POA: Diagnosis not present

## 2022-05-10 DIAGNOSIS — E1159 Type 2 diabetes mellitus with other circulatory complications: Secondary | ICD-10-CM | POA: Diagnosis not present

## 2022-05-10 DIAGNOSIS — E1169 Type 2 diabetes mellitus with other specified complication: Secondary | ICD-10-CM

## 2022-05-10 DIAGNOSIS — Z794 Long term (current) use of insulin: Secondary | ICD-10-CM

## 2022-05-10 DIAGNOSIS — I350 Nonrheumatic aortic (valve) stenosis: Secondary | ICD-10-CM | POA: Diagnosis not present

## 2022-05-10 DIAGNOSIS — Z6833 Body mass index (BMI) 33.0-33.9, adult: Secondary | ICD-10-CM

## 2022-05-10 DIAGNOSIS — N1832 Chronic kidney disease, stage 3b: Secondary | ICD-10-CM

## 2022-05-10 DIAGNOSIS — I152 Hypertension secondary to endocrine disorders: Secondary | ICD-10-CM

## 2022-05-10 DIAGNOSIS — R911 Solitary pulmonary nodule: Secondary | ICD-10-CM

## 2022-05-10 DIAGNOSIS — E785 Hyperlipidemia, unspecified: Secondary | ICD-10-CM

## 2022-05-10 NOTE — Patient Instructions (Addendum)
Medication Instructions:  No changes *If you need a refill on your cardiac medications before your next appointment, please call your pharmacy*   Lab Work: none  Testing/Procedures: Your physician has recommended that you have a sleep study. This test records several body functions during sleep, including: brain activity, eye movement, oxygen and carbon dioxide blood levels, heart rate and rhythm, breathing rate and rhythm, the flow of air through your mouth and nose, snoring, body muscle movements, and chest and belly movement.   Please return for your next appointment on Wed March 27 at 11:00 am.

## 2022-05-11 ENCOUNTER — Ambulatory Visit (INDEPENDENT_AMBULATORY_CARE_PROVIDER_SITE_OTHER): Payer: Medicaid Other | Admitting: Pulmonary Disease

## 2022-05-11 ENCOUNTER — Encounter: Payer: Self-pay | Admitting: Pulmonary Disease

## 2022-05-11 VITALS — BP 138/78 | HR 67 | Ht 62.0 in | Wt 201.8 lb

## 2022-05-11 DIAGNOSIS — G4733 Obstructive sleep apnea (adult) (pediatric): Secondary | ICD-10-CM | POA: Diagnosis not present

## 2022-05-11 DIAGNOSIS — R0683 Snoring: Secondary | ICD-10-CM | POA: Diagnosis not present

## 2022-05-11 NOTE — Patient Instructions (Signed)
We will schedule you for sleep study  Split-night sleep study to try and diagnose the problem and treat the problem  Try and wake up at a fixed time in the morning-this is the best way to try and help yourself sleep a little bit earlier than 3 AM  We will need about 6 to 8 hours of sleep every night  Regular exercises to try and lose some weight  Find out more information about CPAP therapy for sleep apnea  Tentative follow-up in about 3 months  Living With Sleep Apnea Sleep apnea is a condition in which breathing pauses or becomes shallow during sleep. Sleep apnea is most commonly caused by a collapsed or blocked airway. People with sleep apnea usually snore loudly. They may have times when they gasp and stop breathing for 10 seconds or more during sleep. This may happen many times during the night. The breaks in breathing also interrupt the deep sleep that you need to feel rested. Even if you do not completely wake up from the gaps in breathing, your sleep may not be restful and you feel tired during the day. You may also have a headache in the morning and low energy during the day, and you may feel anxious or depressed. How can sleep apnea affect me? Sleep apnea increases your chances of extreme tiredness during the day (daytime fatigue). It can also increase your risk for health conditions, such as: Heart attack. Stroke. Obesity. Type 2 diabetes. Heart failure. Irregular heartbeat. High blood pressure. If you have daytime fatigue as a result of sleep apnea, you may be more likely to: Perform poorly at school or work. Fall asleep while driving. Have difficulty with attention. Develop depression or anxiety. Have sexual dysfunction. What actions can I take to manage sleep apnea? Sleep apnea treatment  If you were given a device to open your airway while you sleep, use it only as told by your health care provider. You may be given: An oral appliance. This is a custom-made  mouthpiece that shifts your lower jaw forward. A continuous positive airway pressure (CPAP) device. This device blows air through a mask when you breathe out (exhale). A nasal expiratory positive airway pressure (EPAP) device. This device has valves that you put into each nostril. A bi-level positive airway pressure (BIPAP) device. This device blows air through a mask when you breathe in (inhale) and breathe out (exhale). You may need surgery if other treatments do not work for you. Sleep habits Go to sleep and wake up at the same time every day. This helps set your internal clock (circadian rhythm) for sleeping. If you stay up later than usual, such as on weekends, try to get up in the morning within 2 hours of your normal wake time. Try to get at least 7-9 hours of sleep each night. Stop using a computer, tablet, and mobile phone a few hours before bedtime. Do not take long naps during the day. If you nap, limit it to 30 minutes. Have a relaxing bedtime routine. Reading or listening to music may relax you and help you sleep. Use your bedroom only for sleep. Keep your television and computer out of your bedroom. Keep your bedroom cool, dark, and quiet. Use a supportive mattress and pillows. Follow your health care provider's instructions for other changes to sleep habits. Nutrition Do not eat heavy meals in the evening. Do not have caffeine in the later part of the day. The effects of caffeine can last for more  than 5 hours. Follow your health care provider's or dietitian's instructions for any diet changes. Lifestyle     Do not drink alcohol before bedtime. Alcohol can cause you to fall asleep at first, but then it can cause you to wake up in the middle of the night and have trouble getting back to sleep. Do not use any products that contain nicotine or tobacco. These products include cigarettes, chewing tobacco, and vaping devices, such as e-cigarettes. If you need help quitting, ask  your health care provider. Medicines Take over-the-counter and prescription medicines only as told by your health care provider. Do not use over-the-counter sleep medicine. You can become dependent on this medicine, and it can make sleep apnea worse. Do not use medicines, such as sedatives and narcotics, unless told by your health care provider. Activity Exercise on most days, but avoid exercising in the evening. Exercising near bedtime can interfere with sleeping. If possible, spend time outside every day. Natural light helps regulate your circadian rhythm. General information Lose weight if you need to, and maintain a healthy weight. Keep all follow-up visits. This is important. If you are having surgery, make sure to tell your health care provider that you have sleep apnea. You may need to bring your device with you. Where to find more information Learn more about sleep apnea and daytime fatigue from: American Sleep Association: sleepassociation.[place]: sleepfoundation.org National Heart, Lung, and Blood Institute: https://www.[place]man-[place].biz/ Summary Sleep apnea is a condition in which breathing pauses or becomes shallow during sleep. Sleep apnea can cause daytime fatigue and other serious health conditions. You may need to wear a device while sleeping to help keep your airway open. If you are having surgery, make sure to tell your health care provider that you have sleep apnea. You may need to bring your device with you. Making changes to sleep habits, diet, lifestyle, and activity can help you manage sleep apnea. This information is not intended to replace advice given to you by your health care provider. Make sure you discuss any questions you have with your health care provider. Document Revised: 09/16/2020 Document Reviewed: 01/17/2020 Elsevier Patient Education  [place].

## 2022-05-12 NOTE — Progress Notes (Signed)
Seeing you on 3/27. Just FYI. Cor clean. Not sure if impacts anything for AS. Just FYI.  Thanks [employer]

## 2022-05-12 NOTE — Progress Notes (Signed)
Chelsea English    [id number]    02-Sep-1960  Primary Care Physician:Burgoa-Rio, Rosanna Randy, PA-C  Referring Physician: Domenic Polite, MD [address] [employer] [place],  [place] [address]  Chief complaint:   Snoring, nonrestorative sleep  Patient's daughter and interpreter present, assisted with evaluation  HPI:  Many years of snoring, nonrestorative sleep Erratic breathing at night Denies choking or coughing during sleep, denies palpitations at night Was recently hospitalized, managed for acute decompensated diastolic heart failure Was started on oxygen supplementation at night  Never smoked, was exposed to some secondhand smoke Denies underlying history of lung disease  History of cardiac rhythm problems, diabetes, hypercholesterolemia, allergy and sinus problems  Usually falls asleep at about 3 AM, wakes up about 12 noon Takes a very long time to fall asleep, will sometimes go to bed about 10 PM and not fall asleep till much later Wakes up about every hour, most of the time to use the bathroom  She does try to stay active during the day but does not do any routine exercises  She does have a history of aortic stenosis, history of diabetes, hypertension, stage IIIb chronic kidney disease, class I obesity, retinopathy  Outpatient Encounter Medications as of 05/11/2022  Medication Sig   amLODipine (NORVASC) 5 MG tablet Take 5 mg by mouth daily.   aspirin 81 MG chewable tablet Chew 81 mg by mouth daily.   atorvastatin (LIPITOR) 40 MG tablet Take 40 mg by mouth at bedtime.   BD INSULIN SYRINGE U/F 31G X 5/16" 1 ML MISC Inject 1 each into the skin 3 (three) times daily. Use to inject unsulin   benzonatate (TESSALON) 100 MG capsule Take 100 mg by mouth 2 (two) times daily as needed for cough.   carvedilol (COREG) 3.125 MG tablet Take 1 tablet (3.125 mg total) by mouth 2 (two) times daily with a meal.   cyclobenzaprine (FLEXERIL) 5 MG tablet Take 10 mg by mouth 2  (two) times daily as needed for muscle spasms.   ferrous sulfate 325 (65 FE) MG tablet Take 650 mg by mouth daily with breakfast.   gabapentin (NEURONTIN) 100 MG capsule Take 100 mg by mouth daily.   insulin NPH-regular Human (HUMULIN 70/30) (70-30) 100 UNIT/ML injection Inject 40-60 Units into the skin See admin instructions. Injecting 60 units in the morning and 40 units at bedtime.   JANUVIA 100 MG tablet Take 100 mg by mouth daily.   Multiple Vitamins-Minerals (MULTI FOR HER 50+ PO) Take 1 tablet by mouth daily.   [DISCONTINUED] furosemide (LASIX) 20 MG tablet Take 1 tablet (20 mg total) by mouth daily. If you notice increased swelling or weight gain, pls call Dr.Patwardhan (Cardiologist's) office, you may need extra dose of Lasix   No facility-administered encounter medications on file as of 05/11/2022.    Allergies as of 05/11/2022 - Review Complete 05/11/2022  Allergen Reaction Noted   Penicillins Anaphylaxis 07/17/2021    Past Medical History:  Diagnosis Date   Anemia    Aortic stenosis    Coronary artery disease    Diabetes mellitus without complication ([place])    Hyperlipidemia    Hypertension     Past Surgical History:  Procedure Laterality Date   ARTERY BIOPSY Right 07/20/2021   Procedure: BIOPSY OF RIGHT TEMPORAL ARTERY;  Surgeon: Angelia Mould, MD;  Location: [facility] OR;  Service: Vascular;  Laterality: Right;   cataract Right    HYSTEROSCOPY WITH D &  C     removal of polyp   MULTIPLE TOOTH EXTRACTIONS     no teeth, no dentures   RIGHT AND LEFT HEART CATH N/A 04/19/2022   Procedure: RIGHT AND LEFT HEART CATH;  Surgeon: [name], MD;  Location: [facility] LAB;  Service: Cardiovascular;  Laterality: N/A;    No family history on file.  Social History   Socioeconomic History   Marital status: Widowed    Spouse name: Not on file   Number of children: Not on file   Years of education: Not on file   Highest education level: Not on file   Occupational History   Not on file  Tobacco Use   Smoking status: Never   Smokeless tobacco: Never  Vaping Use   Vaping Use: Never used  Substance and Sexual Activity   Alcohol use: Never   Drug use: Never   Sexual activity: Not Currently    Birth control/protection: Post-menopausal  Other Topics Concern   Not on file  Social History Narrative   Not on file   Social Determinants of Health   Financial Resource Strain: Not on file  Food Insecurity: No Food Insecurity (04/15/2022)   Hunger Vital Sign    Worried About Running Out of Food in the Last Year: Never true    Ran Out of Food in the Last Year: Never true  Transportation Needs: No Transportation Needs (04/15/2022)   PRAPARE - Transportation    Lack of Transportation (Medical): No    Lack of Transportation (Non-Medical): No  Physical Activity: Not on file  Stress: Not on file  Social Connections: Not on file  Intimate Partner Violence: Not At Risk (04/15/2022)   Humiliation, Afraid, Rape, and Kick questionnaire    Fear of Current or Ex-Partner: No    Emotionally Abused: No    Physically Abused: No    Sexually Abused: No    Review of Systems  Constitutional:  Positive for fatigue.  Eyes:  Positive for visual disturbance.  Respiratory:  Positive for shortness of breath.   Psychiatric/Behavioral:  Positive for sleep disturbance.     Vitals:   05/11/22 1019  BP: 138/78  Pulse: 67  SpO2: 100%     Physical Exam Constitutional:      Appearance: She is obese.  HENT:     Head: Normocephalic.     Nose: Nose normal.     Mouth/Throat:     Mouth: Mucous membranes are moist.     Comments: Mallampati 4, crowded oropharynx, macroglossia Eyes:     Pupils: Pupils are equal, round, and reactive to light.  Cardiovascular:     Rate and Rhythm: Normal rate and regular rhythm.     Heart sounds: Murmur heard.     No friction rub.  Pulmonary:     Effort: No respiratory distress.     Breath sounds: No stridor. No  wheezing or rhonchi.  Musculoskeletal:     Cervical back: No rigidity or tenderness.  Neurological:     Mental Status: She is alert.  Psychiatric:        Mood and Affect: Mood normal.       05/11/2022   10:00 AM  Results of the Epworth flowsheet  Sitting and reading 0  Watching TV 0  Sitting, inactive in a public place (e.g. a theatre or a meeting) 2  As a passenger in a car for an hour without a break 2  Lying down to rest in the afternoon when  circumstances permit 0  Sitting and talking to someone 0  Sitting quietly after a lunch without alcohol 0  In a car, while stopped for a few minutes in traffic 0  Total score 4    Data Reviewed: Recent echocardiogram with severe aortic stenosis -Aortic valve area of 0.71 cm  Recent cardiac catheterization with no significant coronary artery disease, pulmonary artery pressure of 37/16, cardiac index of 3.5  Recent CT scan of the chest with right upper lobe nodule, reviewed by myself  Recent consult by Dr. Lavonna Monarch reviewed, consult by Dr. Ali Lowe reviewed  Assessment:  Moderate probability of significant obstructive sleep apnea  Delayed sleep phase disorder  Daytime fatigue  Severe aortic stenosis, undergoing evaluation for aortic valve replacement  Nocturnal desaturations, currently on oxygen supplementation  Ongoing comorbidities include obesity, probable nocturnal hypoventilation syndrome, diastolic heart failure, diabetes, chronic kidney disease Most severe comorbidity will be patient's R symptomatic aortic valve stenosis-this is being addressed  Lung nodule, 6 mm  Plan/Recommendations: Will schedule the patient for split-night study  Behavioral modifications to manage delayed sleep phase disorder also discussed -Trying to phase advance by waking up earlier and trying to optimize time in bed discussed   Pathophysiology of sleep disordered breathing was discussed Treatment options discussed  Importance of weight  management discussed  Measures to assist with advancing phase of sleep  CPAP therapy as an option of treatment was discussed, information material was provided, did encourage patient's daughter to look for more information regarding CPAP therapy to ensure patient will be able to comply with treatment  She will need a CT follow-up for the lung nodule-it is likely a benign process  She will continue oxygen supplementation at present  Further evaluation and management of obstructive sleep apnea should not preclude management of symptomatic aortic stenosis  Sherrilyn Rist MD [facility] 05/12/2022, 4:12 AM  CC: Domenic Polite, MD

## 2022-05-13 NOTE — Progress Notes (Unsigned)
Patient ID: Chelsea English MRN: [id number] DOB/AGE: [birth year] 63 y.o.  Primary Care Physician:Burgo[place]-Rio, Rosanna Randy, PA-C Primary Cardiologist: Vernell Leep, MD  FOCUSED CARDIOVASCULAR PROBLEM LIST:   1.  Severe aortic stenosis with an aortic valve area of 0.71 cm, mean gradient 47 mmHg, and peak velocity of 4.4 m/s with a normal ejection fraction; EKG demonstrates sinus rhythm without bundle-branch blocks 2.  Type 2 diabetes on insulin c/b nephropathy and retinopathy 3.  Hypertension 4.  Hyperlipidemia 5.  BMI of 33 6.  CKD stage III 7.  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  BMI of 33  HISTORY OF PRESENT ILLNESS:  May 09, 2021:  The patient is a 62 y.o. female with the indicated medical history here for recommendations regarding her severe aortic stenosis.  The patient was admitted to the hospital recently with an acute diastolic heart failure exacerbation.  She was medically stabilized.  Echocardiography demonstrated severe aortic stenosis with preserved ejection fraction.  Coronary angiography demonstrated no significant obstructive disease.  She was optimized from medical standpoint and discharged.  She saw Dr. Lavonna Monarch recently recommend follow-up because of her multiple comorbidities.  Given her young age she was thought to be best served by a surgical aortic valve replacement however the patient was not willing to pursue this approach.  The patient is here with 2 of her daughters.  She tells me she has been feeling fairly good since being discharged from the hospital.  She denies any chest pain, shortness of breath, presyncope, or syncope.  She did have issues with orthopnea prior to admission but this is much better.  She has been completely compliant with her medications.  She fortunately has not required any emergency room visits or hospitalizations.  She does snore and suffers from daytime somnolence.  She is scheduled to see sleep medicine tomorrow.  She has not  seen a dentist in some time.  She denies any dental pain.  Plan:  Return next week for further discussion regarding treatment of aortic stenosis.  Today:   Past Medical History:  Diagnosis Date   Anemia    Aortic stenosis    Coronary artery disease    Diabetes mellitus without complication ([place])    Hyperlipidemia    Hypertension     Past Surgical History:  Procedure Laterality Date   ARTERY BIOPSY Right 07/20/2021   Procedure: BIOPSY OF RIGHT TEMPORAL ARTERY;  Surgeon: Angelia Mould, MD;  Location: [facility] OR;  Service: Vascular;  Laterality: Right;   cataract Right    HYSTEROSCOPY WITH D & C     removal of polyp   MULTIPLE TOOTH EXTRACTIONS     no teeth, no dentures   RIGHT AND LEFT HEART CATH N/A 04/19/2022   Procedure: RIGHT AND LEFT HEART CATH;  Surgeon: [name], MD;  Location: [facility];  Service: Cardiovascular;  Laterality: N/A;    No family history on file.  Social History   Socioeconomic History   Marital status: Widowed    Spouse name: Not on file   Number of children: Not on file   Years of education: Not on file   Highest education level: Not on file  Occupational History   Not on file  Tobacco Use   Smoking status: Never   Smokeless tobacco: Never  Vaping Use   Vaping Use: Never used  Substance and Sexual Activity   Alcohol use: Never   Drug use: Never   Sexual activity: Not Currently  Birth control/protection: Post-menopausal  Other Topics Concern   Not on file  Social History Narrative   Not on file   Social Determinants of Health   Financial Resource Strain: Not on file  Food Insecurity: No Food Insecurity (04/15/2022)   Hunger Vital Sign    Worried About Running Out of Food in the Last Year: Never true    Ran Out of Food in the Last Year: Never true  Transportation Needs: No Transportation Needs (04/15/2022)   PRAPARE - Hydrologist (Medical): No    Lack of Transportation (Non-Medical):  No  Physical Activity: Not on file  Stress: Not on file  Social Connections: Not on file  Intimate Partner Violence: Not At Risk (04/15/2022)   Humiliation, Afraid, Rape, and Kick questionnaire    Fear of Current or Ex-Partner: No    Emotionally Abused: No    Physically Abused: No    Sexually Abused: No     Prior to Admission medications   Medication Sig Start Date End Date Taking? Authorizing Provider  aspirin 81 MG chewable tablet Chew 81 mg by mouth daily. 07/06/18   [provider]  atorvastatin (LIPITOR) 40 MG tablet Take 40 mg by mouth at bedtime. 06/01/21   [provider]  BD INSULIN SYRINGE U/F 31G X 5/16" 1 ML MISC Inject 1 each into the skin 3 (three) times daily. Use to inject unsulin 02/07/22   [provider]  benzonatate (TESSALON) 100 MG capsule Take 100 mg by mouth 2 (two) times daily as needed for cough. 07/12/21   [provider]  carvedilol (COREG) 3.125 MG tablet Take 1 tablet (3.125 mg total) by mouth 2 (two) times daily with a meal. 04/20/22   Domenic Polite, MD  cyclobenzaprine (FLEXERIL) 5 MG tablet Take 10 mg by mouth 2 (two) times daily as needed for muscle spasms. 07/06/21   [provider]  ferrous sulfate 325 (65 FE) MG tablet Take 650 mg by mouth daily with breakfast.    [provider]  furosemide (LASIX) 20 MG tablet Take 1 tablet (20 mg total) by mouth daily. If you notice increased swelling or weight gain, pls call Dr.Patwardhan (Cardiologist's) office, you may need extra dose of Lasix 04/20/22   Domenic Polite, MD  gabapentin (NEURONTIN) 100 MG capsule Take 100 mg by mouth daily. 07/06/21   [provider]  insulin NPH-regular Human (HUMULIN 70/30) (70-30) 100 UNIT/ML injection Inject 40-60 Units into the skin See admin instructions. Injecting 60 units in the morning and 40 units at bedtime. 10/20/17   [provider]  JANUVIA 100 MG tablet Take 100 mg by mouth daily. 07/05/21   [provider]  Multiple Vitamins-Minerals (MULTI FOR HER 50+ PO) Take 1 tablet by mouth daily.    [provider]    Allergies  Allergen Reactions   Penicillins Anaphylaxis    REVIEW OF SYSTEMS:  General: no fevers/chills/night sweats Eyes: no blurry vision, diplopia, or amaurosis ENT: no sore throat or hearing loss Resp: no cough, wheezing, or hemoptysis CV: no edema or palpitations GI: no abdominal pain, nausea, vomiting, diarrhea, or constipation GU: no dysuria, frequency, or hematuria Skin: no rash Neuro: no headache, numbness, tingling, or weakness of extremities Musculoskeletal: no joint pain or swelling Heme: no bleeding, DVT, or easy bruising Endo: no polydipsia or polyuria  There were no vitals taken for this visit.  PHYSICAL EXAM: GEN:  AO x 3 in no acute distress HEENT: normal Dentition:  Poor Neck: JVP normal. +2 carotid upstrokes without bruits. No thyromegaly. Lungs: equal expansion, clear bilaterally CV: Apex is discrete and nondisplaced, RRR with 3/6 SEM Abd: soft, non-tender, non-distended; no bruit; positive bowel sounds Ext: no edema, ecchymoses, or cyanosis Vascular: 2+ femoral pulses, 2+ radial pulses       Skin: warm and dry without rash Neuro: CN II-XII grossly intact; motor and sensory grossly intact    DATA AND STUDIES:  EKG:  SR without conduction deficits  2D ECHO: February 2024 1. The aortic valve is tricuspid. There is severe calcifcation of the  aortic valve. There is severe thickening of the aortic valve. Aortic valve  regurgitation is trivial. Severe aortic valve stenosis with AVA 0.71cm2,  Aortic valve mean gradient measures   47.0 mmHg. Aortic valve Vmax measures 4.44 m/s. DI 0.23.   2. Left ventricular ejection fraction, by estimation, is 60 to 65%. The  left ventricle has normal function. The left ventricle has no regional  wall motion abnormalities. There is moderate concentric left ventricular  hypertrophy. Left  ventricular  diastolic parameters are consistent with Grade I diastolic dysfunction  (impaired relaxation).   3. Right ventricular systolic function is normal. The right ventricular  size is normal. There is normal pulmonary artery systolic pressure. The  estimated right ventricular systolic pressure is 0000000 mmHg.   4. Left atrial size was mildly dilated.   5. The mitral valve is degenerative. Trivial mitral valve regurgitation.   6. The inferior vena cava is normal in size with greater than 50%  respiratory variability, suggesting right atrial pressure of 3 mmHg.   CARDIAC CATH: February 2024  RA: 9 mmHg RV: 45/6 mmHg PA: 37/16 mmHg, mPAP 25 mmHg PCW: 14 mmHg   CO: 6.8 L/min CI: 3.5 L/min/m2   No significant coronary artery disease  CTA: 1. Tricuspid aortic valve with severely reduced cusp excursion. Severely thickened and severely calcified aortic valve cusps.   2.  Aortic valve calcium score: 1203   3. Annulus area: 399 mm2, suitable for 23 mm Sapien 3 valve. No LVOT calcifications. Membranous septal length 4.5 mm.   4.  Sufficient coronary artery heights from annulus.   5.  Optimum fluoroscopic angle for delivery: LAO 1 CAU 1  6.  6 mm right upper lobe lung nodule which is noncalcified. Non-contrast chest CT at 6 months is recommended. If the nodule is stable at time of repeat CT, then future CT at 18-24 months (from today's scan) is considered optional for low-risk patients, but is recommended for high-risk patients.  STS RISK CALCULATOR: Pending  NHYA CLASS: 2    ASSESSMENT AND PLAN:   Severe aortic stenosis  Type 2 diabetes mellitus with complication, with long-term current use of insulin ([place])  Hypertension associated with diabetes ([place])  Hyperlipidemia associated with type 2 diabetes mellitus ([place])  Stage 3b chronic kidney disease ([place])  BMI 33.0-33.9,adult  Pulmonary nodule        Early Osmond, MD  05/13/2022 12:50 PM    [address], [place], [place]  [address] Phone: [phone]; Fax: [phone]

## 2022-05-18 ENCOUNTER — Ambulatory Visit: Payer: Medicaid Other | Attending: Internal Medicine | Admitting: Internal Medicine

## 2022-05-18 ENCOUNTER — Encounter: Payer: Self-pay | Admitting: Internal Medicine

## 2022-05-18 VITALS — BP 140/78 | HR 70 | Ht 62.0 in | Wt 200.8 lb

## 2022-05-18 DIAGNOSIS — E1159 Type 2 diabetes mellitus with other circulatory complications: Secondary | ICD-10-CM

## 2022-05-18 DIAGNOSIS — I152 Hypertension secondary to endocrine disorders: Secondary | ICD-10-CM

## 2022-05-18 DIAGNOSIS — E1169 Type 2 diabetes mellitus with other specified complication: Secondary | ICD-10-CM | POA: Diagnosis not present

## 2022-05-18 DIAGNOSIS — E118 Type 2 diabetes mellitus with unspecified complications: Secondary | ICD-10-CM

## 2022-05-18 DIAGNOSIS — E785 Hyperlipidemia, unspecified: Secondary | ICD-10-CM

## 2022-05-18 DIAGNOSIS — Z6833 Body mass index (BMI) 33.0-33.9, adult: Secondary | ICD-10-CM

## 2022-05-18 DIAGNOSIS — I350 Nonrheumatic aortic (valve) stenosis: Secondary | ICD-10-CM | POA: Diagnosis not present

## 2022-05-18 DIAGNOSIS — Z794 Long term (current) use of insulin: Secondary | ICD-10-CM

## 2022-05-18 DIAGNOSIS — N1832 Chronic kidney disease, stage 3b: Secondary | ICD-10-CM

## 2022-05-18 DIAGNOSIS — R911 Solitary pulmonary nodule: Secondary | ICD-10-CM

## 2022-05-18 NOTE — Patient Instructions (Signed)
Medication Instructions:  No changes *If you need a refill on your cardiac medications before your next appointment, please call your pharmacy*   Lab Work: none If you have labs (blood work) drawn today and your tests are completely normal, you will receive your results only by: [place] (if you have MyChart) OR A paper copy in the mail If you have any lab test that is abnormal or we need to change your treatment, we will call you to review the results.   Testing/Procedures: none   Follow-Up: With Dr. Lavonna Monarch at [facility] - as scheduled.  Other Instructions Dental appointment is needed. Please let us know if you do not hear from Dr. Raynelle Dick office.

## 2022-05-19 ENCOUNTER — Other Ambulatory Visit: Payer: Self-pay

## 2022-05-19 ENCOUNTER — Telehealth: Payer: Self-pay

## 2022-05-19 MED ORDER — CARVEDILOL 3.125 MG PO TABS: 3.1250 mg | ORAL_TABLET | Freq: Two times a day (BID) | ORAL | 3 refills | Status: DC

## 2022-05-19 NOTE — Addendum Note (Signed)
Addended by: Rodman Key on: 05/19/2022 02:17 PM   Modules accepted: Orders

## 2022-05-19 NOTE — Telephone Encounter (Signed)
Per Med Reconciliation - lasix was sent to pharmacy yesterday - [facility].

## 2022-05-19 NOTE — Telephone Encounter (Signed)
Pt requesting refill of Furosemide. Medication is no longer on her med list (discontinued) but is listed within the [facility] notes from yesterday. Would you like to refill this medication?

## 2022-05-29 NOTE — Progress Notes (Unsigned)
[address]       [place] [address]             [phone]           Santiago Linen [facility] Medical Record #[id number] Date of Birth: [birth year]  [name], MD Freddrick March, PA-C  Chief Complaint: AS    History of Present Illness:     Pt and family return to discuss surgical care of her AS. Pt at higher risk for surgery and TAVR was anticipated. However after CTA she was noted to have a potential for issues in the future if valve deterioration occurred and surgery was again discusssed. Pt and family here to understand risks and reasons for recommendations     Past Medical History:  Diagnosis Date   Anemia    Aortic stenosis    Coronary artery disease    Diabetes mellitus without complication (HCC)    Hyperlipidemia    Hypertension     Past Surgical History:  Procedure Laterality Date   ARTERY BIOPSY Right 07/20/2021   Procedure: BIOPSY OF RIGHT TEMPORAL ARTERY;  Surgeon: Chuck Hint, MD;  Location: [facility] OR;  Service: Vascular;  Laterality: Right;   cataract Right    HYSTEROSCOPY WITH D & C     removal of polyp   MULTIPLE TOOTH EXTRACTIONS     no teeth, no dentures   RIGHT AND LEFT HEART CATH N/A 04/19/2022   Procedure: RIGHT AND LEFT HEART CATH;  Surgeon: [name], MD;  Location: [facility] INVASIVE CV LAB;  Service: Cardiovascular;  Laterality: N/A;    Social History   Tobacco Use  Smoking Status Never  Smokeless Tobacco Never    Social History   Substance and Sexual Activity  Alcohol Use Never    Social History   Socioeconomic History   Marital status: Widowed    Spouse name: Not on file   Number of children: Not on file   Years of education: Not on file   Highest education level: Not on file  Occupational History   Not on file  Tobacco Use   Smoking status: Never   Smokeless tobacco: Never  Vaping Use   Vaping Use: Never used  Substance and Sexual Activity   Alcohol use: Never    Drug use: Never   Sexual activity: Not Currently    Birth control/protection: Post-menopausal  Other Topics Concern   Not on file  Social History Narrative   Not on file   Social Determinants of Health   Financial Resource Strain: Not on file  Food Insecurity: No Food Insecurity (04/15/2022)   Hunger Vital Sign    Worried About Running Out of Food in the Last Year: Never true    Ran Out of Food in the Last Year: Never true  Transportation Needs: No Transportation Needs (04/15/2022)   PRAPARE - Transportation    Lack of Transportation (Medical): No    Lack of Transportation (Non-Medical): No  Physical Activity: Not on file  Stress: Not on file  Social Connections: Not on file  Intimate Partner Violence: Not At Risk (04/15/2022)   Humiliation, Afraid, Rape, and Kick questionnaire    Fear of Current or Ex-Partner: No    Emotionally Abused: No    Physically Abused: No    Sexually Abused: No    Allergies  Allergen Reactions   Penicillins Anaphylaxis    Current Outpatient Medications  Medication Sig Dispense Refill   amLODipine (NORVASC)  5 MG tablet Take 5 mg by mouth daily.     aspirin 81 MG chewable tablet Chew 81 mg by mouth daily.     atorvastatin (LIPITOR) 40 MG tablet Take 40 mg by mouth at bedtime.     BD INSULIN SYRINGE U/F 31G X 5/16" 1 ML MISC Inject 1 each into the skin 3 (three) times daily. Use to inject unsulin     benzonatate (TESSALON) 100 MG capsule Take 100 mg by mouth 2 (two) times daily as needed for cough.     carvedilol (COREG) 3.125 MG tablet Take 1 tablet (3.125 mg total) by mouth 2 (two) times daily with a meal. 180 tablet 3   cyclobenzaprine (FLEXERIL) 5 MG tablet Take 10 mg by mouth 2 (two) times daily as needed for muscle spasms.     ferrous sulfate 325 (65 FE) MG tablet Take 650 mg by mouth daily with breakfast.     furosemide (LASIX) 40 MG tablet Take 40 mg by mouth daily.     gabapentin (NEURONTIN) 100 MG capsule Take 100 mg by mouth daily.      insulin NPH-regular Human (HUMULIN 70/30) (70-30) 100 UNIT/ML injection Inject 40-60 Units into the skin See admin instructions. Injecting 60 units in the morning and 40 units at bedtime.     JANUVIA 100 MG tablet Take 100 mg by mouth daily.     Multiple Vitamins-Minerals (MULTI FOR HER 50+ PO) Take 1 tablet by mouth daily.     No current facility-administered medications for this visit.     No family history on file.     Physical Exam:  Deffered    Diagnostic Studies & Laboratory data: I have personally reviewed the following studies and agree with the findings     Recent Radiology Findings:       Recent Lab Findings: Lab Results  Component Value Date   WBC 11.7 (H) 04/19/2022   HGB 9.9 (L) 04/19/2022   HGB 9.9 (L) 04/19/2022   HCT 29.0 (L) 04/19/2022   HCT 29.0 (L) 04/19/2022   PLT 252 04/19/2022   GLUCOSE 266 (H) 04/20/2022   CHOL 152 07/18/2021   TRIG 156 (H) 07/18/2021   HDL 27 (L) 07/18/2021   LDLCALC 94 07/18/2021   ALT 55 (H) 04/16/2022   AST 36 04/16/2022   NA 133 (L) 04/20/2022   K 4.1 04/20/2022   CL 100 04/20/2022   CREATININE 1.73 (H) 04/20/2022   BUN 65 (H) 04/20/2022   CO2 24 04/20/2022   TSH 1.502 07/18/2021   INR 1.2 04/16/2022   HGBA1C 6.8 (H) 04/15/2022      Assessment / Plan:     Pt is a 62 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 Pt and family ( with interpreter present) were given the reasons for recommendation for mechanical AVR currently to avoid a potential issue in the future. They understand that SAVR has higher up front concerns over mortality and renal dysfunction but avoids potential for need for TAVR valve in valve in future with anatomical concern for coronary obstruction. Pt and family favor SAVR now and will discuss if this is what they want over next few days but for now proceeding with savr.   I have spent 40 min in review of the records, viewing studies and in face to  face with patient and in coordination of future care    Eugenio Hoes 05/29/2022 5:29 PM

## 2022-05-30 ENCOUNTER — Ambulatory Visit (INDEPENDENT_AMBULATORY_CARE_PROVIDER_SITE_OTHER): Payer: Medicaid Other | Admitting: Thoracic Surgery (Cardiothoracic Vascular Surgery)

## 2022-05-30 ENCOUNTER — Other Ambulatory Visit: Payer: Self-pay | Admitting: *Deleted

## 2022-05-30 ENCOUNTER — Encounter: Payer: Self-pay | Admitting: Thoracic Surgery (Cardiothoracic Vascular Surgery)

## 2022-05-30 VITALS — BP 160/84 | HR 84 | Resp 20 | Ht 62.0 in | Wt 204.0 lb

## 2022-05-30 DIAGNOSIS — I350 Nonrheumatic aortic (valve) stenosis: Secondary | ICD-10-CM | POA: Diagnosis not present

## 2022-05-30 NOTE — Patient Instructions (Signed)
SAVR with mechanical valve

## 2022-05-31 ENCOUNTER — Encounter: Payer: Self-pay | Admitting: *Deleted

## 2022-06-09 NOTE — Progress Notes (Signed)
Surgical Instructions    Your procedure is scheduled on Tuesday, 06/14/22.  Report to [facility] Main Entrance "A" at 5:30 A.M., then check in with the Admitting office.  Call this number if you have problems the morning of surgery:  [phone]   If you have any questions prior to your surgery date call [phone]: Open Monday-Friday 8am-4pm If you experience any cold or flu symptoms such as cough, fever, chills, shortness of breath, etc. between now and your scheduled surgery, please notify us at the above number     Remember:  Do not eat or drink after midnight the night before your surgery     Take these medicines the morning of surgery with A SIP OF WATER:  amLODipine (NORVASC)  carvedilol (COREG)  gabapentin (NEURONTIN)   IF NEEDED: benzonatate (TESSALON)  cyclobenzaprine (FLEXERIL)   As of today, STOP taking any Aspirin (unless otherwise instructed by your surgeon) Aleve, Naproxen, Ibuprofen, Motrin, Advil, Goody's, BC's, all herbal medications, fish oil, and all vitamins.  WHAT DO I DO ABOUT MY DIABETES MEDICATION?   Do not take oral diabetes medicines (pills) the morning of surgery.  THE NIGHT BEFORE SURGERY, take 28 units of insulin NPH-regular Human (HUMULIN 70/30).      THE MORNING OF SURGERY, do not take insulin NPH-regular Human (HUMULIN 70/30) or JANUVIA.  The day of surgery, do not take other diabetes injectables, including Byetta (exenatide), Bydureon (exenatide ER), Victoza (liraglutide), or Trulicity (dulaglutide).  If your CBG is greater than 220 mg/dL, you may take  of your sliding scale (correction) dose of insulin.   HOW TO MANAGE YOUR DIABETES BEFORE AND AFTER SURGERY  Why is it important to control my blood sugar before and after surgery? Improving blood sugar levels before and after surgery helps healing and can limit problems. A way of improving blood sugar control is eating a healthy diet by:  Eating less sugar and carbohydrates   Increasing activity/exercise  Talking with your doctor about reaching your blood sugar goals High blood sugars (greater than 180 mg/dL) can raise your risk of infections and slow your recovery, so you will need to focus on controlling your diabetes during the weeks before surgery. Make sure that the doctor who takes care of your diabetes knows about your planned surgery including the date and location.  How do I manage my blood sugar before surgery? Check your blood sugar at least 4 times a day, starting 2 days before surgery, to make sure that the level is not too high or low.  Check your blood sugar the morning of your surgery when you wake up and every 2 hours until you get to the Short Stay unit.  If your blood sugar is less than 70 mg/dL, you will need to treat for low blood sugar: Do not take insulin. Treat a low blood sugar (less than 70 mg/dL) with  cup of clear juice (cranberry or apple), 4 glucose tablets, OR glucose gel. Recheck blood sugar in 15 minutes after treatment (to make sure it is greater than 70 mg/dL). If your blood sugar is not greater than 70 mg/dL on recheck, call [phone] for further instructions. Report your blood sugar to the short stay nurse when you get to Short Stay.  If you are admitted to the hospital after surgery: Your blood sugar will be checked by the staff and you will probably be given insulin after surgery (instead of oral diabetes medicines) to make sure you have good blood sugar levels. The goal  for blood sugar control after surgery is 80-180 mg/dL.            Do not wear jewelry or makeup. Do not wear lotions, powders, perfumes or deodorant. Do not shave 48 hours prior to surgery.   Do not bring valuables to the hospital. Do not wear nail polish, gel polish, artificial nails, or any other type of covering on natural nails (fingers and toes) If you have artificial nails or gel coating that need to be removed by a nail salon, please have this  removed prior to surgery. Artificial nails or gel coating may interfere with anesthesia's ability to adequately monitor your vital signs.  Reserve is not responsible for any belongings or valuables.    Do NOT Smoke (Tobacco/Vaping)  24 hours prior to your procedure  If you use a CPAP at night, you may bring your mask for your overnight stay.   Contacts, glasses, hearing aids, dentures or partials may not be worn into surgery, please bring cases for these belongings   For patients admitted to the hospital, discharge time will be determined by your treatment team.   Patients discharged the day of surgery will not be allowed to drive home, and someone needs to stay with them for 24 hours.   SURGICAL WAITING ROOM VISITATION Patients having surgery or a procedure may have no more than 2 support people in the waiting area - these visitors may rotate.   Children under the age of 81 must have an adult with them who is not the patient. If the patient needs to stay at the hospital during part of their recovery, the visitor guidelines for inpatient rooms apply. Pre-op nurse will coordinate an appropriate time for 1 support person to accompany patient in pre-op.  This support person may not rotate.   Please refer to [email or web address] for the visitor guidelines for Inpatients (after your surgery is over and you are in a regular room).    Special instructions:    Oral Hygiene is also important to reduce your risk of infection.  Remember - BRUSH YOUR TEETH THE MORNING OF SURGERY WITH YOUR REGULAR TOOTHPASTE   [place]- Preparing For Surgery  Before surgery, you can play an important role. Because skin is not sterile, your skin needs to be as free of germs as possible. You can reduce the number of germs on your skin by washing with CHG (chlorahexidine gluconate) Soap before surgery.  CHG is an antiseptic cleaner which kills germs and bonds  with the skin to continue killing germs even after washing.     Please do not use if you have an allergy to CHG or antibacterial soaps. If your skin becomes reddened/irritated stop using the CHG.  Do not shave (including legs and underarms) for at least 48 hours prior to first CHG shower. It is OK to shave your face.  Please follow these instructions carefully.     Shower the NIGHT BEFORE SURGERY and the MORNING OF SURGERY with CHG Soap.   If you chose to wash your hair, wash your hair first as usual with your normal shampoo. After you shampoo, rinse your hair and body thoroughly to remove the shampoo.  Then [employer] and genitals (private parts) with your normal soap and rinse thoroughly to remove soap.  After that Use CHG Soap as you would any other liquid soap. You can apply CHG directly to the skin and wash gently with a scrungie or a clean washcloth.   Apply the  CHG Soap to your body ONLY FROM THE NECK DOWN.  Do not use on open wounds or open sores. Avoid contact with your eyes, ears, mouth and genitals (private parts). Wash Face and genitals (private parts)  with your normal soap.   Wash thoroughly, paying special attention to the area where your surgery will be performed.  Thoroughly rinse your body with warm water from the neck down.  DO NOT shower/wash with your normal soap after using and rinsing off the CHG Soap.  Pat yourself dry with a CLEAN TOWEL.  Wear CLEAN PAJAMAS to bed the night before surgery  Place CLEAN SHEETS on your bed the night before your surgery  DO NOT SLEEP WITH PETS.   Day of Surgery: Take a shower with CHG soap. Wear Clean/Comfortable clothing the morning of surgery Do not apply any deodorants/lotions.   Remember to brush your teeth WITH YOUR REGULAR TOOTHPASTE.    If you received a COVID test during your pre-op visit, it is requested that you wear a mask when out in public, stay away from anyone that may not be feeling well, and notify your  surgeon if you develop symptoms. If you have been in contact with anyone that has tested positive in the last 10 days, please notify your surgeon.    Please read over the following fact sheets that you were given.

## 2022-06-10 ENCOUNTER — Ambulatory Visit (HOSPITAL_COMMUNITY): Admission: RE | Admit: 2022-06-10 | Payer: Medicaid Other | Source: Ambulatory Visit

## 2022-06-10 ENCOUNTER — Encounter (HOSPITAL_COMMUNITY): Payer: Self-pay

## 2022-06-10 ENCOUNTER — Other Ambulatory Visit: Payer: Self-pay

## 2022-06-10 ENCOUNTER — Other Ambulatory Visit (HOSPITAL_COMMUNITY): Payer: Medicaid Other

## 2022-06-10 ENCOUNTER — Encounter (HOSPITAL_COMMUNITY)
Admission: RE | Admit: 2022-06-10 | Discharge: 2022-06-10 | Disposition: A | Discharge status: Home / Self Care | Payer: Medicaid Other | Source: Ambulatory Visit | Attending: Thoracic Surgery (Cardiothoracic Vascular Surgery) | Admitting: Thoracic Surgery (Cardiothoracic Vascular Surgery)

## 2022-06-10 ENCOUNTER — Ambulatory Visit (HOSPITAL_COMMUNITY)
Admission: RE | Admit: 2022-06-10 | Discharge: 2022-06-10 | Disposition: A | Discharge status: Home / Self Care | Payer: Medicaid Other | Source: Ambulatory Visit | Attending: Thoracic Surgery (Cardiothoracic Vascular Surgery) | Admitting: Thoracic Surgery (Cardiothoracic Vascular Surgery)

## 2022-06-10 VITALS — BP 152/72 | HR 87 | Temp 97.8°F | Resp 17 | Ht 62.0 in | Wt 200.0 lb

## 2022-06-10 DIAGNOSIS — I350 Nonrheumatic aortic (valve) stenosis: Secondary | ICD-10-CM | POA: Diagnosis not present

## 2022-06-10 DIAGNOSIS — Z01818 Encounter for other preprocedural examination: Secondary | ICD-10-CM | POA: Insufficient documentation

## 2022-06-10 DIAGNOSIS — Z1152 Encounter for screening for COVID-19: Secondary | ICD-10-CM | POA: Insufficient documentation

## 2022-06-10 DIAGNOSIS — E1169 Type 2 diabetes mellitus with other specified complication: Secondary | ICD-10-CM

## 2022-06-10 HISTORY — DX: Dependence on supplemental oxygen: Z99.81

## 2022-06-10 HISTORY — DX: Cardiac murmur, unspecified: R01.1

## 2022-06-10 LAB — URINALYSIS, ROUTINE W REFLEX MICROSCOPIC
Bilirubin Urine: NEGATIVE
Glucose, UA: 50 mg/dL — AB
Hgb urine dipstick: NEGATIVE
Ketones, ur: NEGATIVE mg/dL
Leukocytes,Ua: NEGATIVE
Nitrite: NEGATIVE
Protein, ur: NEGATIVE mg/dL
Specific Gravity, Urine: 1.008 (ref 1.005–1.030)
pH: 5 (ref 5.0–8.0)

## 2022-06-10 LAB — CBC
HCT: 29.5 % — ABNORMAL LOW (ref 36.0–46.0)
Hemoglobin: 9.7 g/dL — ABNORMAL LOW (ref 12.0–15.0)
MCH: 27.3 pg (ref 26.0–34.0)
MCHC: 32.9 g/dL (ref 30.0–36.0)
MCV: 83.1 fL (ref 80.0–100.0)
Platelets: 221 10*3/uL (ref 150–400)
RBC: 3.55 MIL/uL — ABNORMAL LOW (ref 3.87–5.11)
RDW: 12.4 % (ref 11.5–15.5)
WBC: 9.7 10*3/uL (ref 4.0–10.5)
nRBC: 0 % (ref 0.0–0.2)

## 2022-06-10 LAB — COMPREHENSIVE METABOLIC PANEL
ALT: 38 U/L (ref 0–44)
AST: 32 U/L (ref 15–41)
Albumin: 3.5 g/dL (ref 3.5–5.0)
Alkaline Phosphatase: 162 U/L — ABNORMAL HIGH (ref 38–126)
Anion gap: 12 (ref 5–15)
BUN: 63 mg/dL — ABNORMAL HIGH (ref 8–23)
CO2: 21 mmol/L — ABNORMAL LOW (ref 22–32)
Calcium: 9.1 mg/dL (ref 8.9–10.3)
Chloride: 98 mmol/L (ref 98–111)
Creatinine, Ser: 1.77 mg/dL — ABNORMAL HIGH (ref 0.44–1.00)
GFR, Estimated: 32 mL/min — ABNORMAL LOW (ref >60)
Glucose, Bld: 340 mg/dL — ABNORMAL HIGH (ref 70–99)
Potassium: 4.3 mmol/L (ref 3.5–5.1)
Sodium: 131 mmol/L — ABNORMAL LOW (ref 135–145)
Total Bilirubin: 0.4 mg/dL (ref 0.3–1.2)
Total Protein: 7.6 g/dL (ref 6.5–8.1)

## 2022-06-10 LAB — BLOOD GAS, ARTERIAL
Acid-Base Excess: 0.8 mmol/L (ref 0.0–2.0)
Bicarbonate: 26 mmol/L (ref 20.0–28.0)
Drawn by: 164
O2 Saturation: 96.8 %
Patient temperature: 37
pCO2 arterial: 43 mmHg (ref 32–48)
pH, Arterial: 7.39 (ref 7.35–7.45)
pO2, Arterial: 73 mmHg — ABNORMAL LOW (ref 83–108)

## 2022-06-10 LAB — HEMOGLOBIN A1C
Hgb A1c MFr Bld: 9.9 % — ABNORMAL HIGH (ref 4.8–5.6)
Mean Plasma Glucose: 237.43 mg/dL

## 2022-06-10 LAB — TYPE AND SCREEN

## 2022-06-10 LAB — APTT: aPTT: 35 seconds (ref 24–36)

## 2022-06-10 LAB — SURGICAL PCR SCREEN
MRSA, PCR: NEGATIVE
Staphylococcus aureus: NEGATIVE

## 2022-06-10 LAB — PROTIME-INR
INR: 1 (ref 0.8–1.2)
Prothrombin Time: 13.4 seconds (ref 11.4–15.2)

## 2022-06-10 LAB — GLUCOSE, CAPILLARY: Glucose-Capillary: 311 mg/dL — ABNORMAL HIGH (ref 70–99)

## 2022-06-10 LAB — SARS CORONAVIRUS 2 (TAT 6-24 HRS): SARS Coronavirus 2: NEGATIVE

## 2022-06-10 NOTE — Progress Notes (Signed)
PCP - Fayrene Fearing wofford Cardiologist - weldner, Lynnette Caffey Pulmonologist: Ol[place]  PPM/ICD - denies   Chest x-ray - 06/10/22 EKG - 06/10/22 Stress Test - denies ECHO - 04/16/22 Cardiac Cath - 04/19/22  Sleep Study - negative for sleep apean, but does wear 2L O2 at night for oxygen saturations dropping   Fasting Blood Sugar - 110-150 Checks Blood Sugar once a day   ERAS Protcol -no   COVID TEST- 06/10/22 in PAT   Anesthesia review: yes, review labs. CBG 311- notified james burns, PA-C. Will advise patient to monitor CBGs closely at home and notify office if they continue over 200.  Patient denies shortness of breath, fever, cough and chest pain at PAT appointment   All instructions explained to the patient, with a verbal understanding of the material. Patient agrees to go over the instructions while at home for a better understanding. Patient also instructed to self quarantine after being tested for COVID-19. The opportunity to ask questions was provided.

## 2022-06-10 NOTE — Progress Notes (Signed)
Notified Chelsea English and Dr. Leafy Ro about elevated A1C 9.9 and CBG 311 today at pre op appt. Alycia Rossetti will reach out to daughter and advise her about monitoring CBGs more closely this weekend and assess accordingly.

## 2022-06-13 MED ORDER — NITROGLYCERIN IN D5W 200-5 MCG/ML-% IV SOLN
2.0000 ug/min | INTRAVENOUS | Status: DC
  Filled 2022-06-13: qty 250

## 2022-06-13 MED ORDER — VANCOMYCIN HCL 1500 MG/300ML IV SOLN
1500.0000 mg | INTRAVENOUS | Status: AC
  Administered 2022-06-14: 1500 mg via INTRAVENOUS
  Filled 2022-06-13: qty 300

## 2022-06-13 MED ORDER — VANCOMYCIN HCL 1000 MG IV SOLR
INTRAVENOUS | Status: DC
  Filled 2022-06-13: qty 20

## 2022-06-13 MED ORDER — NOREPINEPHRINE 4 MG/250ML-% IV SOLN
0.0000 ug/min | INTRAVENOUS | Status: DC
  Filled 2022-06-13: qty 250

## 2022-06-13 MED ORDER — TRANEXAMIC ACID 1000 MG/10ML IV SOLN
1.5000 mg/kg/h | INTRAVENOUS | Status: AC
  Administered 2022-06-14: 1.5 mg/kg/h via INTRAVENOUS
  Filled 2022-06-13: qty 25

## 2022-06-13 MED ORDER — TRANEXAMIC ACID (OHS) PUMP PRIME SOLUTION
2.0000 mg/kg | INTRAVENOUS | Status: DC
  Filled 2022-06-13: qty 1.81

## 2022-06-13 MED ORDER — HEPARIN 30,000 UNITS/1000 ML (OHS) CELLSAVER SOLUTION
Status: DC
  Filled 2022-06-13: qty 1000

## 2022-06-13 MED ORDER — DEXMEDETOMIDINE HCL IN NACL 400 MCG/100ML IV SOLN
0.1000 ug/kg/h | INTRAVENOUS | Status: AC
  Administered 2022-06-14: .2 ug/kg/h via INTRAVENOUS
  Filled 2022-06-13: qty 100

## 2022-06-13 MED ORDER — PLASMA-LYTE A IV SOLN
INTRAVENOUS | Status: DC
  Filled 2022-06-13: qty 2.5

## 2022-06-13 MED ORDER — POTASSIUM CHLORIDE 2 MEQ/ML IV SOLN
80.0000 meq | INTRAVENOUS | Status: DC
  Filled 2022-06-13: qty 40

## 2022-06-13 MED ORDER — TRANEXAMIC ACID (OHS) BOLUS VIA INFUSION
15.0000 mg/kg | INTRAVENOUS | Status: AC
  Administered 2022-06-14: 1360.5 mg via INTRAVENOUS
  Filled 2022-06-13: qty 1361

## 2022-06-13 MED ORDER — LEVOFLOXACIN IN D5W 500 MG/100ML IV SOLN
500.0000 mg | INTRAVENOUS | Status: AC
  Administered 2022-06-14: 500 mg via INTRAVENOUS
  Filled 2022-06-13: qty 100

## 2022-06-13 MED ORDER — MILRINONE LACTATE IN DEXTROSE 20-5 MG/100ML-% IV SOLN
0.3000 ug/kg/min | INTRAVENOUS | Status: DC
  Filled 2022-06-13: qty 100

## 2022-06-13 MED ORDER — INSULIN REGULAR(HUMAN) IN NACL 100-0.9 UT/100ML-% IV SOLN
INTRAVENOUS | Status: AC
  Administered 2022-06-14: 2 [IU]/h via INTRAVENOUS
  Filled 2022-06-13: qty 100

## 2022-06-13 MED ORDER — MANNITOL 20 % IV SOLN
INTRAVENOUS | Status: DC
  Filled 2022-06-13: qty 13

## 2022-06-13 MED ORDER — PHENYLEPHRINE HCL-NACL 20-0.9 MG/250ML-% IV SOLN
30.0000 ug/min | INTRAVENOUS | Status: AC
  Administered 2022-06-14: 25 ug/min via INTRAVENOUS
  Filled 2022-06-13: qty 250

## 2022-06-13 MED ORDER — EPINEPHRINE HCL 5 MG/250ML IV SOLN IN NS
0.0000 ug/min | INTRAVENOUS | Status: DC
  Filled 2022-06-13: qty 250

## 2022-06-13 MED ORDER — VANCOMYCIN HCL 1250 MG/250ML IV SOLN
1250.0000 mg | INTRAVENOUS | Status: DC
  Filled 2022-06-13: qty 250

## 2022-06-13 NOTE — H&P (Signed)
[address]       [place] [address]             [phone]                                   Malley Hauter [facility] Medical Record #[id number] Date of Birth: 1960-10-23   [name], MD Freddrick March, PA-C   Chief Complaint: AS     History of Present Illness:     Pt and family return to discuss surgical care of her AS. Pt at higher risk for surgery and TAVR was anticipated. However after CTA she was noted to have a potential for issues in the future if valve deterioration occurred and surgery was again discusssed. Pt and family here to understand risks and reasons for recommendations           Past Medical History:  Diagnosis Date   Anemia     Aortic stenosis     Coronary artery disease     Diabetes mellitus without complication (HCC)     Hyperlipidemia     Hypertension             Past Surgical History:  Procedure Laterality Date   ARTERY BIOPSY Right 07/20/2021    Procedure: BIOPSY OF RIGHT TEMPORAL ARTERY;  Surgeon: Chuck Hint, MD;  Location: [facility] OR;  Service: Vascular;  Laterality: Right;   cataract Right     HYSTEROSCOPY WITH D & C        removal of polyp   MULTIPLE TOOTH EXTRACTIONS        no teeth, no dentures   RIGHT AND LEFT HEART CATH N/A 04/19/2022    Procedure: RIGHT AND LEFT HEART CATH;  Surgeon: [name], MD;  Location: [facility] INVASIVE CV LAB;  Service: Cardiovascular;  Laterality: N/A;      Social History       Tobacco Use  Smoking Status Never  Smokeless Tobacco Never    Social History       Substance and Sexual Activity  Alcohol Use Never      Social History         Socioeconomic History   Marital status: Widowed      Spouse name: Not on file   Number of children: Not on file   Years of education: Not on file   Highest education level: Not on file  Occupational History   Not on file  Tobacco Use   Smoking status: Never   Smokeless tobacco: Never  Vaping Use    Vaping Use: Never used  Substance and Sexual Activity   Alcohol use: Never   Drug use: Never   Sexual activity: Not Currently      Birth control/protection: Post-menopausal  Other Topics Concern   Not on file  Social History Narrative   Not on file    Social Determinants of Health        Financial Resource Strain: Not on file  Food Insecurity: No Food Insecurity (04/15/2022)    Hunger Vital Sign     Worried About Running Out of Food in the Last Year: Never true     Ran Out of Food in the Last Year: Never true  Transportation Needs: No Transportation Needs (04/15/2022)    PRAPARE - Transportation     Lack of Transportation (Medical): No     Lack  of Transportation (Non-Medical): No  Physical Activity: Not on file  Stress: Not on file  Social Connections: Not on file  Intimate Partner Violence: Not At Risk (04/15/2022)    Humiliation, Afraid, Rape, and Kick questionnaire     Fear of Current or Ex-Partner: No     Emotionally Abused: No     Physically Abused: No     Sexually Abused: No          Allergies  Allergen Reactions   Penicillins Anaphylaxis            Current Outpatient Medications  Medication Sig Dispense Refill   amLODipine (NORVASC) 5 MG tablet Take 5 mg by mouth daily.       aspirin 81 MG chewable tablet Chew 81 mg by mouth daily.       atorvastatin (LIPITOR) 40 MG tablet Take 40 mg by mouth at bedtime.       BD INSULIN SYRINGE U/F 31G X 5/16" 1 ML MISC Inject 1 each into the skin 3 (three) times daily. Use to inject unsulin       benzonatate (TESSALON) 100 MG capsule Take 100 mg by mouth 2 (two) times daily as needed for cough.       carvedilol (COREG) 3.125 MG tablet Take 1 tablet (3.125 mg total) by mouth 2 (two) times daily with a meal. 180 tablet 3   cyclobenzaprine (FLEXERIL) 5 MG tablet Take 10 mg by mouth 2 (two) times daily as needed for muscle spasms.       ferrous sulfate 325 (65 FE) MG tablet Take 650 mg by mouth daily with breakfast.        furosemide (LASIX) 40 MG tablet Take 40 mg by mouth daily.       gabapentin (NEURONTIN) 100 MG capsule Take 100 mg by mouth daily.       insulin NPH-regular Human (HUMULIN 70/30) (70-30) 100 UNIT/ML injection Inject 40-60 Units into the skin See admin instructions. Injecting 60 units in the morning and 40 units at bedtime.       JANUVIA 100 MG tablet Take 100 mg by mouth daily.       Multiple Vitamins-Minerals (MULTI FOR HER 50+ PO) Take 1 tablet by mouth daily.        No current facility-administered medications for this visit.        No family history on file.         Physical Exam:   Deffered       Diagnostic Studies & Laboratory data: I have personally reviewed the following studies and agree with the findings     Recent Radiology Findings:        Recent Lab Findings: Recent Labs       Lab Results  Component Value Date    WBC 11.7 (H) 04/19/2022    HGB 9.9 (L) 04/19/2022    HGB 9.9 (L) 04/19/2022    HCT 29.0 (L) 04/19/2022    HCT 29.0 (L) 04/19/2022    PLT 252 04/19/2022    GLUCOSE 266 (H) 04/20/2022    CHOL 152 07/18/2021    TRIG 156 (H) 07/18/2021    HDL 27 (L) 07/18/2021    LDLCALC 94 07/18/2021    ALT 55 (H) 04/16/2022    AST 36 04/16/2022    NA 133 (L) 04/20/2022    K 4.1 04/20/2022    CL 100 04/20/2022    CREATININE 1.73 (H) 04/20/2022    BUN 65 (H) 04/20/2022    CO2  24 04/20/2022    TSH 1.502 07/18/2021    INR 1.2 04/16/2022    HGBA1C 6.8 (H) 04/15/2022            Assessment / Plan:     Pt is a 62 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 Pt and family ( with interpreter present) were given the reasons for recommendation for mechanical AVR currently to avoid a potential issue in the future. They understand that SAVR has higher up front concerns over mortality and renal dysfunction but avoids potential for need for TAVR valve in valve in future with anatomical concern for coronary obstruction.  Pt and family favor SAVR now and will discuss if this is what they want over next few days but for now proceeding with savr.

## 2022-06-13 NOTE — Anesthesia Preprocedure Evaluation (Signed)
Anesthesia Evaluation  Patient identified by MRN, date of birth, ID band Patient awake    Reviewed: Allergy & Precautions, NPO status , Patient's Chart, lab work & pertinent test results  Airway Mallampati: III  TM Distance: >3 FB Neck ROM: Full    Dental  (+) Dental Advisory Given, Teeth Intact, Missing   Pulmonary neg pulmonary ROS   Pulmonary exam normal breath sounds clear to auscultation       Cardiovascular hypertension, Pt. on medications and Pt. on home beta blockers + CAD and +CHF  + dysrhythmias + Valvular Problems/Murmurs  Rhythm:Regular Rate:Normal + Systolic murmurs Echo 03/2022  1. The aortic valve is tricuspid. There is severe calcifcation of the aortic valve. There is severe thickening of the aortic valve. Aortic valve regurgitation is trivial. Severe aortic valve stenosis with AVA 0.71cm2, Aortic valve mean gradient measures 47.0 mmHg. Aortic valve Vmax measures 4.44 m/s. DI 0.23.   2. Left ventricular ejection fraction, by estimation, is 60 to 65%. The left ventricle has normal function. The left ventricle has no regional wall motion abnormalities. There is moderate concentric left ventricular hypertrophy. Left ventricular diastolic parameters are consistent with Grade I diastolic dysfunction (impaired relaxation).   3. Right ventricular systolic function is normal. The right ventricular size is normal. There is normal pulmonary artery systolic pressure. The estimated right ventricular systolic pressure is 29.0 mmHg.   4. Left atrial size was mildly dilated.   5. The mitral valve is degenerative. Trivial mitral valve regurgitation.   6. The inferior vena cava is normal in size with greater than 50% respiratory variability, suggesting right atrial pressure of 3 mmHg.      Neuro/Psych  Neuromuscular disease    GI/Hepatic Neg liver ROS,GERD  ,,  Endo/Other  diabetes, Type 2, Insulin Dependent, Oral Hypoglycemic Agents     Renal/GU CRFRenal disease     Musculoskeletal   Abdominal  (+) + obese  Peds  Hematology  (+) Blood dyscrasia, anemia   Anesthesia Other Findings   Reproductive/Obstetrics                              Anesthesia Physical Anesthesia Plan  ASA: 4  Anesthesia Plan: General   Post-op Pain Management:    Induction: Intravenous  PONV Risk Score and Plan: 3 and Ondansetron, Treatment may vary due to age or medical condition and Midazolam  Airway Management Planned: Oral ETT  Additional Equipment: Arterial line, CVP, PA Cath, TEE and Ultrasound Guidance Line Placement  Intra-op Plan:   Post-operative Plan: Post-operative intubation/ventilation  Informed Consent: I have reviewed the patients History and Physical, chart, labs and discussed the procedure including the risks, benefits and alternatives for the proposed anesthesia with the patient or authorized representative who has indicated his/her understanding and acceptance.     Dental advisory given  Plan Discussed with: CRNA  Anesthesia Plan Comments:          Anesthesia Quick Evaluation

## 2022-06-14 ENCOUNTER — Inpatient Hospital Stay (HOSPITAL_COMMUNITY): Payer: Medicaid Other

## 2022-06-14 ENCOUNTER — Encounter (HOSPITAL_COMMUNITY): Payer: Self-pay | Admitting: Thoracic Surgery (Cardiothoracic Vascular Surgery)

## 2022-06-14 ENCOUNTER — Inpatient Hospital Stay (HOSPITAL_COMMUNITY)
Admission: RE | Admit: 2022-06-14 | Discharge: 2022-06-22 | DRG: 219 | Disposition: A | Discharge status: Home Health | Payer: Medicaid Other | Attending: Thoracic Surgery (Cardiothoracic Vascular Surgery) | Admitting: Thoracic Surgery (Cardiothoracic Vascular Surgery)

## 2022-06-14 ENCOUNTER — Inpatient Hospital Stay (HOSPITAL_COMMUNITY): Payer: Medicaid Other | Admitting: Anesthesiology

## 2022-06-14 ENCOUNTER — Other Ambulatory Visit (HOSPITAL_COMMUNITY): Payer: Medicaid Other

## 2022-06-14 ENCOUNTER — Telehealth: Payer: Self-pay | Admitting: *Deleted

## 2022-06-14 ENCOUNTER — Other Ambulatory Visit: Payer: Self-pay

## 2022-06-14 ENCOUNTER — Encounter (HOSPITAL_COMMUNITY)
Admission: RE | Disposition: A | Discharge status: Home Health | Payer: Self-pay | Source: Home / Self Care | Attending: Thoracic Surgery (Cardiothoracic Vascular Surgery)

## 2022-06-14 ENCOUNTER — Inpatient Hospital Stay (HOSPITAL_COMMUNITY): Payer: Medicaid Other | Admitting: Physician Assistant

## 2022-06-14 DIAGNOSIS — D6959 Other secondary thrombocytopenia: Secondary | ICD-10-CM | POA: Diagnosis not present

## 2022-06-14 DIAGNOSIS — K5900 Constipation, unspecified: Secondary | ICD-10-CM | POA: Diagnosis not present

## 2022-06-14 DIAGNOSIS — Z9981 Dependence on supplemental oxygen: Secondary | ICD-10-CM

## 2022-06-14 DIAGNOSIS — D62 Acute posthemorrhagic anemia: Secondary | ICD-10-CM | POA: Diagnosis not present

## 2022-06-14 DIAGNOSIS — E1122 Type 2 diabetes mellitus with diabetic chronic kidney disease: Secondary | ICD-10-CM | POA: Diagnosis present

## 2022-06-14 DIAGNOSIS — Z7982 Long term (current) use of aspirin: Secondary | ICD-10-CM

## 2022-06-14 DIAGNOSIS — E1165 Type 2 diabetes mellitus with hyperglycemia: Secondary | ICD-10-CM | POA: Diagnosis not present

## 2022-06-14 DIAGNOSIS — I441 Atrioventricular block, second degree: Secondary | ICD-10-CM | POA: Diagnosis not present

## 2022-06-14 DIAGNOSIS — E11319 Type 2 diabetes mellitus with unspecified diabetic retinopathy without macular edema: Secondary | ICD-10-CM | POA: Diagnosis present

## 2022-06-14 DIAGNOSIS — Z6839 Body mass index (BMI) 39.0-39.9, adult: Secondary | ICD-10-CM

## 2022-06-14 DIAGNOSIS — I509 Heart failure, unspecified: Secondary | ICD-10-CM

## 2022-06-14 DIAGNOSIS — Z952 Presence of prosthetic heart valve: Principal | ICD-10-CM

## 2022-06-14 DIAGNOSIS — E876 Hypokalemia: Secondary | ICD-10-CM | POA: Diagnosis not present

## 2022-06-14 DIAGNOSIS — N990 Postprocedural (acute) (chronic) kidney failure: Secondary | ICD-10-CM | POA: Diagnosis not present

## 2022-06-14 DIAGNOSIS — I97190 Other postprocedural cardiac functional disturbances following cardiac surgery: Secondary | ICD-10-CM | POA: Diagnosis not present

## 2022-06-14 DIAGNOSIS — I130 Hypertensive heart and chronic kidney disease with heart failure and stage 1 through stage 4 chronic kidney disease, or unspecified chronic kidney disease: Secondary | ICD-10-CM

## 2022-06-14 DIAGNOSIS — E785 Hyperlipidemia, unspecified: Secondary | ICD-10-CM | POA: Diagnosis present

## 2022-06-14 DIAGNOSIS — E871 Hypo-osmolality and hyponatremia: Secondary | ICD-10-CM | POA: Diagnosis not present

## 2022-06-14 DIAGNOSIS — I350 Nonrheumatic aortic (valve) stenosis: Secondary | ICD-10-CM

## 2022-06-14 DIAGNOSIS — N189 Chronic kidney disease, unspecified: Secondary | ICD-10-CM | POA: Diagnosis not present

## 2022-06-14 DIAGNOSIS — Z794 Long term (current) use of insulin: Secondary | ICD-10-CM | POA: Diagnosis not present

## 2022-06-14 DIAGNOSIS — Y839 Surgical procedure, unspecified as the cause of abnormal reaction of the patient, or of later complication, without mention of misadventure at the time of the procedure: Secondary | ICD-10-CM | POA: Diagnosis not present

## 2022-06-14 DIAGNOSIS — N1832 Chronic kidney disease, stage 3b: Secondary | ICD-10-CM | POA: Diagnosis present

## 2022-06-14 DIAGNOSIS — I5032 Chronic diastolic (congestive) heart failure: Secondary | ICD-10-CM | POA: Diagnosis present

## 2022-06-14 DIAGNOSIS — D72829 Elevated white blood cell count, unspecified: Secondary | ICD-10-CM | POA: Diagnosis not present

## 2022-06-14 DIAGNOSIS — Z7984 Long term (current) use of oral hypoglycemic drugs: Secondary | ICD-10-CM

## 2022-06-14 DIAGNOSIS — Z79899 Other long term (current) drug therapy: Secondary | ICD-10-CM | POA: Diagnosis not present

## 2022-06-14 DIAGNOSIS — D631 Anemia in chronic kidney disease: Secondary | ICD-10-CM | POA: Diagnosis not present

## 2022-06-14 DIAGNOSIS — E669 Obesity, unspecified: Secondary | ICD-10-CM | POA: Diagnosis present

## 2022-06-14 DIAGNOSIS — I2510 Atherosclerotic heart disease of native coronary artery without angina pectoris: Secondary | ICD-10-CM | POA: Diagnosis present

## 2022-06-14 DIAGNOSIS — E1169 Type 2 diabetes mellitus with other specified complication: Secondary | ICD-10-CM

## 2022-06-14 DIAGNOSIS — J9601 Acute respiratory failure with hypoxia: Secondary | ICD-10-CM | POA: Diagnosis not present

## 2022-06-14 DIAGNOSIS — E875 Hyperkalemia: Secondary | ICD-10-CM | POA: Diagnosis not present

## 2022-06-14 DIAGNOSIS — Z880 Allergy status to penicillin: Secondary | ICD-10-CM | POA: Diagnosis not present

## 2022-06-14 DIAGNOSIS — R001 Bradycardia, unspecified: Secondary | ICD-10-CM | POA: Diagnosis not present

## 2022-06-14 HISTORY — PX: TEE WITHOUT CARDIOVERSION: SHX5443

## 2022-06-14 HISTORY — PX: AORTIC VALVE REPLACEMENT: SHX41

## 2022-06-14 HISTORY — DX: Presence of prosthetic heart valve: Z95.2

## 2022-06-14 LAB — POCT I-STAT, CHEM 8
BUN: 45 mg/dL — ABNORMAL HIGH (ref 8–23)
BUN: 48 mg/dL — ABNORMAL HIGH (ref 8–23)
BUN: 48 mg/dL — ABNORMAL HIGH (ref 8–23)
BUN: 44 mg/dL — ABNORMAL HIGH (ref 8–23)
Calcium, Ion: 1.29 mmol/L (ref 1.15–1.40)
Calcium, Ion: 1.23 mmol/L (ref 1.15–1.40)
Calcium, Ion: 1.27 mmol/L (ref 1.15–1.40)
Calcium, Ion: 1.15 mmol/L (ref 1.15–1.40)
Chloride: 102 mmol/L (ref 98–111)
Chloride: 100 mmol/L (ref 98–111)
Chloride: 100 mmol/L (ref 98–111)
Chloride: 101 mmol/L (ref 98–111)
Creatinine, Ser: 1.6 mg/dL — ABNORMAL HIGH (ref 0.44–1.00)
Creatinine, Ser: 1.5 mg/dL — ABNORMAL HIGH (ref 0.44–1.00)
Creatinine, Ser: 1.6 mg/dL — ABNORMAL HIGH (ref 0.44–1.00)
Creatinine, Ser: 1.4 mg/dL — ABNORMAL HIGH (ref 0.44–1.00)
Glucose, Bld: 122 mg/dL — ABNORMAL HIGH (ref 70–99)
Glucose, Bld: 125 mg/dL — ABNORMAL HIGH (ref 70–99)
Glucose, Bld: 139 mg/dL — ABNORMAL HIGH (ref 70–99)
Glucose, Bld: 141 mg/dL — ABNORMAL HIGH (ref 70–99)
HCT: 30 % — ABNORMAL LOW (ref 36.0–46.0)
HCT: 22 % — ABNORMAL LOW (ref 36.0–46.0)
HCT: 25 % — ABNORMAL LOW (ref 36.0–46.0)
HCT: 24 % — ABNORMAL LOW (ref 36.0–46.0)
Hemoglobin: 10.2 g/dL — ABNORMAL LOW (ref 12.0–15.0)
Hemoglobin: 7.5 g/dL — ABNORMAL LOW (ref 12.0–15.0)
Hemoglobin: 8.5 g/dL — ABNORMAL LOW (ref 12.0–15.0)
Hemoglobin: 8.2 g/dL — ABNORMAL LOW (ref 12.0–15.0)
Potassium: 4.2 mmol/L (ref 3.5–5.1)
Potassium: 4.2 mmol/L (ref 3.5–5.1)
Potassium: 4.8 mmol/L (ref 3.5–5.1)
Potassium: 4.9 mmol/L (ref 3.5–5.1)
Sodium: 139 mmol/L (ref 135–145)
Sodium: 136 mmol/L (ref 135–145)
Sodium: 137 mmol/L (ref 135–145)
Sodium: 137 mmol/L (ref 135–145)
TCO2: 26 mmol/L (ref 22–32)
TCO2: 25 mmol/L (ref 22–32)
TCO2: 27 mmol/L (ref 22–32)
TCO2: 26 mmol/L (ref 22–32)

## 2022-06-14 LAB — POCT I-STAT 7, (LYTES, BLD GAS, ICA,H+H)
Acid-Base Excess: 0 mmol/L (ref 0.0–2.0)
Acid-Base Excess: 1 mmol/L (ref 0.0–2.0)
Acid-Base Excess: 1 mmol/L (ref 0.0–2.0)
Acid-base deficit: 1 mmol/L (ref 0.0–2.0)
Acid-base deficit: 1 mmol/L (ref 0.0–2.0)
Acid-base deficit: 3 mmol/L — ABNORMAL HIGH (ref 0.0–2.0)
Acid-base deficit: 3 mmol/L — ABNORMAL HIGH (ref 0.0–2.0)
Bicarbonate: 25 mmol/L (ref 20.0–28.0)
Bicarbonate: 23.9 mmol/L (ref 20.0–28.0)
Bicarbonate: 26.2 mmol/L (ref 20.0–28.0)
Bicarbonate: 24.7 mmol/L (ref 20.0–28.0)
Bicarbonate: 24.9 mmol/L (ref 20.0–28.0)
Bicarbonate: 24.7 mmol/L (ref 20.0–28.0)
Bicarbonate: 23.5 mmol/L (ref 20.0–28.0)
Calcium, Ion: 1.31 mmol/L (ref 1.15–1.40)
Calcium, Ion: 1.11 mmol/L — ABNORMAL LOW (ref 1.15–1.40)
Calcium, Ion: 1.19 mmol/L (ref 1.15–1.40)
Calcium, Ion: 1.14 mmol/L — ABNORMAL LOW (ref 1.15–1.40)
Calcium, Ion: 1.2 mmol/L (ref 1.15–1.40)
Calcium, Ion: 1.23 mmol/L (ref 1.15–1.40)
Calcium, Ion: 1.23 mmol/L (ref 1.15–1.40)
HCT: 27 % — ABNORMAL LOW (ref 36.0–46.0)
HCT: 20 % — ABNORMAL LOW (ref 36.0–46.0)
HCT: 21 % — ABNORMAL LOW (ref 36.0–46.0)
HCT: 21 % — ABNORMAL LOW (ref 36.0–46.0)
HCT: 24 % — ABNORMAL LOW (ref 36.0–46.0)
HCT: 26 % — ABNORMAL LOW (ref 36.0–46.0)
HCT: 24 % — ABNORMAL LOW (ref 36.0–46.0)
Hemoglobin: 9.2 g/dL — ABNORMAL LOW (ref 12.0–15.0)
Hemoglobin: 6.8 g/dL — CL (ref 12.0–15.0)
Hemoglobin: 7.1 g/dL — ABNORMAL LOW (ref 12.0–15.0)
Hemoglobin: 7.1 g/dL — ABNORMAL LOW (ref 12.0–15.0)
Hemoglobin: 8.2 g/dL — ABNORMAL LOW (ref 12.0–15.0)
Hemoglobin: 8.8 g/dL — ABNORMAL LOW (ref 12.0–15.0)
Hemoglobin: 8.2 g/dL — ABNORMAL LOW (ref 12.0–15.0)
O2 Saturation: 100 %
O2 Saturation: 100 %
O2 Saturation: 100 %
O2 Saturation: 100 %
O2 Saturation: 98 %
O2 Saturation: 96 %
O2 Saturation: 98 %
Patient temperature: 35.9
Patient temperature: 36.7
Patient temperature: 36.9
Potassium: 4.3 mmol/L (ref 3.5–5.1)
Potassium: 4.1 mmol/L (ref 3.5–5.1)
Potassium: 4.8 mmol/L (ref 3.5–5.1)
Potassium: 4.8 mmol/L (ref 3.5–5.1)
Potassium: 4.7 mmol/L (ref 3.5–5.1)
Potassium: 5.7 mmol/L — ABNORMAL HIGH (ref 3.5–5.1)
Potassium: 5.4 mmol/L — ABNORMAL HIGH (ref 3.5–5.1)
Sodium: 139 mmol/L (ref 135–145)
Sodium: 137 mmol/L (ref 135–145)
Sodium: 136 mmol/L (ref 135–145)
Sodium: 137 mmol/L (ref 135–145)
Sodium: 137 mmol/L (ref 135–145)
Sodium: 138 mmol/L (ref 135–145)
Sodium: 138 mmol/L (ref 135–145)
TCO2: 26 mmol/L (ref 22–32)
TCO2: 25 mmol/L (ref 22–32)
TCO2: 27 mmol/L (ref 22–32)
TCO2: 26 mmol/L (ref 22–32)
TCO2: 26 mmol/L (ref 22–32)
TCO2: 27 mmol/L (ref 22–32)
TCO2: 25 mmol/L (ref 22–32)
pCO2 arterial: 42.9 mmHg (ref 32–48)
pCO2 arterial: 37.6 mmHg (ref 32–48)
pCO2 arterial: 41.5 mmHg (ref 32–48)
pCO2 arterial: 35.7 mmHg (ref 32–48)
pCO2 arterial: 45.3 mmHg (ref 32–48)
pCO2 arterial: 58.4 mmHg — ABNORMAL HIGH (ref 32–48)
pCO2 arterial: 47.8 mmHg (ref 32–48)
pH, Arterial: 7.374 (ref 7.35–7.45)
pH, Arterial: 7.412 (ref 7.35–7.45)
pH, Arterial: 7.408 (ref 7.35–7.45)
pH, Arterial: 7.448 (ref 7.35–7.45)
pH, Arterial: 7.344 — ABNORMAL LOW (ref 7.35–7.45)
pH, Arterial: 7.233 — ABNORMAL LOW (ref 7.35–7.45)
pH, Arterial: 7.299 — ABNORMAL LOW (ref 7.35–7.45)
pO2, Arterial: 274 mmHg — ABNORMAL HIGH (ref 83–108)
pO2, Arterial: 369 mmHg — ABNORMAL HIGH (ref 83–108)
pO2, Arterial: 365 mmHg — ABNORMAL HIGH (ref 83–108)
pO2, Arterial: 373 mmHg — ABNORMAL HIGH (ref 83–108)
pO2, Arterial: 114 mmHg — ABNORMAL HIGH (ref 83–108)
pO2, Arterial: 96 mmHg (ref 83–108)
pO2, Arterial: 110 mmHg — ABNORMAL HIGH (ref 83–108)

## 2022-06-14 LAB — POCT I-STAT EG7
Acid-base deficit: 1 mmol/L (ref 0.0–2.0)
Bicarbonate: 24.2 mmol/L (ref 20.0–28.0)
Calcium, Ion: 1.13 mmol/L — ABNORMAL LOW (ref 1.15–1.40)
HCT: 21 % — ABNORMAL LOW (ref 36.0–46.0)
Hemoglobin: 7.1 g/dL — ABNORMAL LOW (ref 12.0–15.0)
O2 Saturation: 65 %
Potassium: 4.1 mmol/L (ref 3.5–5.1)
Sodium: 138 mmol/L (ref 135–145)
TCO2: 25 mmol/L (ref 22–32)
pCO2, Ven: 39.2 mmHg — ABNORMAL LOW (ref 44–60)
pH, Ven: 7.398 (ref 7.25–7.43)
pO2, Ven: 34 mmHg (ref 32–45)

## 2022-06-14 LAB — BASIC METABOLIC PANEL
Anion gap: 8 (ref 5–15)
BUN: 45 mg/dL — ABNORMAL HIGH (ref 8–23)
CO2: 23 mmol/L (ref 22–32)
Calcium: 8 mg/dL — ABNORMAL LOW (ref 8.9–10.3)
Chloride: 103 mmol/L (ref 98–111)
Creatinine, Ser: 1.56 mg/dL — ABNORMAL HIGH (ref 0.44–1.00)
GFR, Estimated: 37 mL/min — ABNORMAL LOW (ref >60)
Glucose, Bld: 128 mg/dL — ABNORMAL HIGH (ref 70–99)
Potassium: 5.4 mmol/L — ABNORMAL HIGH (ref 3.5–5.1)
Sodium: 134 mmol/L — ABNORMAL LOW (ref 135–145)

## 2022-06-14 LAB — PLATELET COUNT: Platelets: 164 10*3/uL (ref 150–400)

## 2022-06-14 LAB — GLUCOSE, CAPILLARY
Glucose-Capillary: 66 mg/dL — ABNORMAL LOW (ref 70–99)
Glucose-Capillary: 142 mg/dL — ABNORMAL HIGH (ref 70–99)
Glucose-Capillary: 144 mg/dL — ABNORMAL HIGH (ref 70–99)
Glucose-Capillary: 136 mg/dL — ABNORMAL HIGH (ref 70–99)
Glucose-Capillary: 121 mg/dL — ABNORMAL HIGH (ref 70–99)
Glucose-Capillary: 142 mg/dL — ABNORMAL HIGH (ref 70–99)
Glucose-Capillary: 140 mg/dL — ABNORMAL HIGH (ref 70–99)
Glucose-Capillary: 137 mg/dL — ABNORMAL HIGH (ref 70–99)
Glucose-Capillary: 123 mg/dL — ABNORMAL HIGH (ref 70–99)
Glucose-Capillary: 149 mg/dL — ABNORMAL HIGH (ref 70–99)

## 2022-06-14 LAB — APTT: aPTT: 42 seconds — ABNORMAL HIGH (ref 24–36)

## 2022-06-14 LAB — CBC
HCT: 25.4 % — ABNORMAL LOW (ref 36.0–46.0)
HCT: 25.6 % — ABNORMAL LOW (ref 36.0–46.0)
Hemoglobin: 8.6 g/dL — ABNORMAL LOW (ref 12.0–15.0)
Hemoglobin: 8.3 g/dL — ABNORMAL LOW (ref 12.0–15.0)
MCH: 27.8 pg (ref 26.0–34.0)
MCH: 27.5 pg (ref 26.0–34.0)
MCHC: 33.9 g/dL (ref 30.0–36.0)
MCHC: 32.4 g/dL (ref 30.0–36.0)
MCV: 82.2 fL (ref 80.0–100.0)
MCV: 84.8 fL (ref 80.0–100.0)
Platelets: 140 10*3/uL — ABNORMAL LOW (ref 150–400)
Platelets: 125 10*3/uL — ABNORMAL LOW (ref 150–400)
RBC: 3.09 MIL/uL — ABNORMAL LOW (ref 3.87–5.11)
RBC: 3.02 MIL/uL — ABNORMAL LOW (ref 3.87–5.11)
RDW: 12.7 % (ref 11.5–15.5)
RDW: 12.9 % (ref 11.5–15.5)
WBC: 14.6 10*3/uL — ABNORMAL HIGH (ref 4.0–10.5)
WBC: 16.7 10*3/uL — ABNORMAL HIGH (ref 4.0–10.5)
nRBC: 0 % (ref 0.0–0.2)
nRBC: 0 % (ref 0.0–0.2)

## 2022-06-14 LAB — BPAM RBC

## 2022-06-14 LAB — MAGNESIUM: Magnesium: 4 mg/dL — ABNORMAL HIGH (ref 1.7–2.4)

## 2022-06-14 LAB — ECHO INTRAOPERATIVE TEE: Weight: 3199.99 oz

## 2022-06-14 LAB — HEMOGLOBIN AND HEMATOCRIT, BLOOD
HCT: 19.2 % — ABNORMAL LOW (ref 36.0–46.0)
Hemoglobin: 6.4 g/dL — CL (ref 12.0–15.0)

## 2022-06-14 LAB — PROTIME-INR
INR: 1.5 — ABNORMAL HIGH (ref 0.8–1.2)
Prothrombin Time: 18 seconds — ABNORMAL HIGH (ref 11.4–15.2)

## 2022-06-14 LAB — PREPARE RBC (CROSSMATCH)

## 2022-06-14 LAB — TYPE AND SCREEN

## 2022-06-14 LAB — ABO/RH: ABO/RH(D): B POS

## 2022-06-14 SURGERY — REPLACEMENT, AORTIC VALVE, OPEN
Anesthesia: General | Site: Chest

## 2022-06-14 MED ORDER — MIDAZOLAM HCL (PF) 5 MG/ML IJ SOLN
INTRAMUSCULAR | Status: DC | PRN
  Administered 2022-06-14: 2 mg via INTRAVENOUS
  Administered 2022-06-14: 1 mg via INTRAVENOUS
  Administered 2022-06-14: 4 mg via INTRAVENOUS
  Administered 2022-06-14: 1 mg via INTRAVENOUS
  Administered 2022-06-14: 2 mg via INTRAVENOUS

## 2022-06-14 MED ORDER — LACTATED RINGERS IV SOLN: INTRAVENOUS | Status: DC | PRN

## 2022-06-14 MED ORDER — HEPARIN SODIUM (PORCINE) 1000 UNIT/ML IJ SOLN
INTRAMUSCULAR | Status: AC
  Filled 2022-06-14: qty 1

## 2022-06-14 MED ORDER — SODIUM CHLORIDE 0.9 % IV SOLN: INTRAVENOUS | Status: DC | PRN

## 2022-06-14 MED ORDER — PLASMA-LYTE A IV SOLN: INTRAVENOUS | Status: DC | PRN

## 2022-06-14 MED ORDER — 0.9 % SODIUM CHLORIDE (POUR BTL) OPTIME
TOPICAL | Status: DC | PRN
  Administered 2022-06-14: 5000 mL

## 2022-06-14 MED ORDER — PHENOL 1.4 % MT LIQD
1.0000 | OROMUCOSAL | Status: DC | PRN
  Filled 2022-06-14: qty 177

## 2022-06-14 MED ORDER — MAGNESIUM SULFATE 4 GM/100ML IV SOLN
4.0000 g | Freq: Once | INTRAVENOUS | Status: AC
  Administered 2022-06-14: 4 g via INTRAVENOUS
  Filled 2022-06-14: qty 100

## 2022-06-14 MED ORDER — ACETAMINOPHEN 650 MG RE SUPP
650.0000 mg | Freq: Once | RECTAL | Status: AC
  Administered 2022-06-14: 650 mg via RECTAL

## 2022-06-14 MED ORDER — ACETAMINOPHEN 500 MG PO TABS
1000.0000 mg | ORAL_TABLET | Freq: Four times a day (QID) | ORAL | Status: AC
  Administered 2022-06-14 – 2022-06-19 (×20): 1000 mg via ORAL
  Filled 2022-06-14 (×20): qty 2

## 2022-06-14 MED ORDER — HEPARIN SODIUM (PORCINE) 1000 UNIT/ML IJ SOLN
INTRAMUSCULAR | Status: DC | PRN
  Administered 2022-06-14: 31000 [IU] via INTRAVENOUS

## 2022-06-14 MED ORDER — METOPROLOL TARTRATE 12.5 MG HALF TABLET
12.5000 mg | ORAL_TABLET | Freq: Once | ORAL | Status: AC
  Administered 2022-06-14: 12.5 mg via ORAL
  Filled 2022-06-14: qty 1

## 2022-06-14 MED ORDER — ALBUMIN HUMAN 5 % IV SOLN: INTRAVENOUS | Status: DC | PRN

## 2022-06-14 MED ORDER — FENTANYL CITRATE (PF) 250 MCG/5ML IJ SOLN
INTRAMUSCULAR | Status: AC
  Filled 2022-06-14: qty 5

## 2022-06-14 MED ORDER — LEVOFLOXACIN IN D5W 750 MG/150ML IV SOLN
750.0000 mg | INTRAVENOUS | Status: AC
  Administered 2022-06-15: 750 mg via INTRAVENOUS
  Filled 2022-06-14: qty 150

## 2022-06-14 MED ORDER — ORAL CARE MOUTH RINSE: 15.0000 mL | OROMUCOSAL | Status: DC | PRN

## 2022-06-14 MED ORDER — CHLORHEXIDINE GLUCONATE 0.12 % MT SOLN
15.0000 mL | OROMUCOSAL | Status: AC
  Administered 2022-06-14: 15 mL via OROMUCOSAL
  Filled 2022-06-14: qty 15

## 2022-06-14 MED ORDER — SODIUM CHLORIDE 0.45 % IV SOLN: INTRAVENOUS | Status: DC | PRN

## 2022-06-14 MED ORDER — VANCOMYCIN HCL 1000 MG IV SOLR: INTRAVENOUS | Status: DC | PRN

## 2022-06-14 MED ORDER — CHLORHEXIDINE GLUCONATE 0.12 % MT SOLN
15.0000 mL | Freq: Once | OROMUCOSAL | Status: AC
  Administered 2022-06-14: 15 mL via OROMUCOSAL
  Filled 2022-06-14: qty 15

## 2022-06-14 MED ORDER — ACETAMINOPHEN 160 MG/5ML PO SOLN: 650.0000 mg | Freq: Once | ORAL | Status: AC

## 2022-06-14 MED ORDER — SODIUM CHLORIDE 0.9 % IV SOLN: 250.0000 mL | INTRAVENOUS | Status: DC

## 2022-06-14 MED ORDER — ASPIRIN 325 MG PO TBEC
325.0000 mg | DELAYED_RELEASE_TABLET | Freq: Every day | ORAL | Status: DC
  Administered 2022-06-15: 325 mg via ORAL
  Filled 2022-06-14: qty 1

## 2022-06-14 MED ORDER — BISACODYL 5 MG PO TBEC
10.0000 mg | DELAYED_RELEASE_TABLET | Freq: Every day | ORAL | Status: DC
  Administered 2022-06-15 – 2022-06-19 (×5): 10 mg via ORAL
  Filled 2022-06-14 (×6): qty 2

## 2022-06-14 MED ORDER — OXYCODONE HCL 5 MG PO TABS
5.0000 mg | ORAL_TABLET | ORAL | Status: DC | PRN
  Administered 2022-06-14 – 2022-06-21 (×8): 10 mg via ORAL
  Filled 2022-06-14 (×8): qty 2

## 2022-06-14 MED ORDER — FAMOTIDINE IN NACL 20-0.9 MG/50ML-% IV SOLN
20.0000 mg | Freq: Two times a day (BID) | INTRAVENOUS | Status: AC
  Administered 2022-06-14 (×2): 20 mg via INTRAVENOUS
  Filled 2022-06-14 (×2): qty 50

## 2022-06-14 MED ORDER — LACTATED RINGERS IV SOLN: 500.0000 mL | Freq: Once | INTRAVENOUS | Status: DC | PRN

## 2022-06-14 MED ORDER — SODIUM CHLORIDE 0.9% FLUSH: 10.0000 mL | INTRAVENOUS | Status: DC | PRN

## 2022-06-14 MED ORDER — METOPROLOL TARTRATE 5 MG/5ML IV SOLN
2.5000 mg | INTRAVENOUS | Status: DC | PRN
  Administered 2022-06-14: 5 mg via INTRAVENOUS
  Filled 2022-06-14: qty 5

## 2022-06-14 MED ORDER — SODIUM CHLORIDE 0.9% FLUSH
3.0000 mL | Freq: Two times a day (BID) | INTRAVENOUS | Status: DC
  Administered 2022-06-15 – 2022-06-22 (×14): 3 mL via INTRAVENOUS

## 2022-06-14 MED ORDER — ACETAMINOPHEN 160 MG/5ML PO SOLN: 1000.0000 mg | Freq: Four times a day (QID) | ORAL | Status: AC

## 2022-06-14 MED ORDER — ONDANSETRON HCL 4 MG/2ML IJ SOLN: 4.0000 mg | Freq: Four times a day (QID) | INTRAMUSCULAR | Status: DC | PRN

## 2022-06-14 MED ORDER — PROTAMINE SULFATE 10 MG/ML IV SOLN
INTRAVENOUS | Status: DC | PRN
  Administered 2022-06-14: 310 mg via INTRAVENOUS

## 2022-06-14 MED ORDER — POTASSIUM CHLORIDE 10 MEQ/50ML IV SOLN: 10.0000 meq | INTRAVENOUS | Status: AC

## 2022-06-14 MED ORDER — NITROGLYCERIN IN D5W 200-5 MCG/ML-% IV SOLN: 0.0000 ug/min | INTRAVENOUS | Status: DC

## 2022-06-14 MED ORDER — METOPROLOL TARTRATE 12.5 MG HALF TABLET: 12.5000 mg | ORAL_TABLET | Freq: Two times a day (BID) | ORAL | Status: DC

## 2022-06-14 MED ORDER — PROPOFOL 10 MG/ML IV BOLUS
INTRAVENOUS | Status: AC
  Filled 2022-06-14: qty 20

## 2022-06-14 MED ORDER — DEXTROSE 50 % IV SOLN
INTRAVENOUS | Status: AC
  Administered 2022-06-14: 25 mL via INTRAVENOUS
  Filled 2022-06-14: qty 50

## 2022-06-14 MED ORDER — FENTANYL CITRATE (PF) 250 MCG/5ML IJ SOLN
INTRAMUSCULAR | Status: DC | PRN
  Administered 2022-06-14: 50 ug via INTRAVENOUS
  Administered 2022-06-14: 100 ug via INTRAVENOUS
  Administered 2022-06-14: 50 ug via INTRAVENOUS
  Administered 2022-06-14 (×2): 100 ug via INTRAVENOUS
  Administered 2022-06-14: 50 ug via INTRAVENOUS
  Administered 2022-06-14: 100 ug via INTRAVENOUS
  Administered 2022-06-14: 200 ug via INTRAVENOUS
  Administered 2022-06-14: 300 ug via INTRAVENOUS

## 2022-06-14 MED ORDER — ORAL CARE MOUTH RINSE: 15.0000 mL | Freq: Once | OROMUCOSAL | Status: AC

## 2022-06-14 MED ORDER — BISACODYL 10 MG RE SUPP: 10.0000 mg | Freq: Every day | RECTAL | Status: DC

## 2022-06-14 MED ORDER — VANCOMYCIN HCL IN DEXTROSE 1-5 GM/200ML-% IV SOLN
1000.0000 mg | Freq: Once | INTRAVENOUS | Status: AC
  Administered 2022-06-14: 1000 mg via INTRAVENOUS
  Filled 2022-06-14 (×2): qty 200

## 2022-06-14 MED ORDER — ATORVASTATIN CALCIUM 40 MG PO TABS
40.0000 mg | ORAL_TABLET | Freq: Every day | ORAL | Status: DC
  Administered 2022-06-15 – 2022-06-21 (×7): 40 mg via ORAL
  Filled 2022-06-14 (×7): qty 1

## 2022-06-14 MED ORDER — SODIUM CHLORIDE 0.9 % IV SOLN: INTRAVENOUS | Status: DC

## 2022-06-14 MED ORDER — PHENYLEPHRINE 80 MCG/ML (10ML) SYRINGE FOR IV PUSH (FOR BLOOD PRESSURE SUPPORT)
PREFILLED_SYRINGE | INTRAVENOUS | Status: AC
  Filled 2022-06-14: qty 10

## 2022-06-14 MED ORDER — INSULIN ASPART 100 UNIT/ML IJ SOLN: 0.0000 [IU] | INTRAMUSCULAR | Status: DC | PRN

## 2022-06-14 MED ORDER — ROCURONIUM BROMIDE 10 MG/ML (PF) SYRINGE
PREFILLED_SYRINGE | INTRAVENOUS | Status: AC
  Filled 2022-06-14: qty 20

## 2022-06-14 MED ORDER — PHENYLEPHRINE 80 MCG/ML (10ML) SYRINGE FOR IV PUSH (FOR BLOOD PRESSURE SUPPORT)
PREFILLED_SYRINGE | INTRAVENOUS | Status: DC | PRN
  Administered 2022-06-14 (×3): 80 ug via INTRAVENOUS

## 2022-06-14 MED ORDER — ASPIRIN 81 MG PO CHEW
324.0000 mg | CHEWABLE_TABLET | Freq: Every day | ORAL | Status: DC
  Filled 2022-06-14: qty 4

## 2022-06-14 MED ORDER — DEXMEDETOMIDINE HCL IN NACL 400 MCG/100ML IV SOLN
0.0000 ug/kg/h | INTRAVENOUS | Status: DC
  Filled 2022-06-14: qty 100

## 2022-06-14 MED ORDER — ~~LOC~~ CARDIAC SURGERY, PATIENT & FAMILY EDUCATION
Freq: Once | Status: DC
  Filled 2022-06-14: qty 1

## 2022-06-14 MED ORDER — TRAMADOL HCL 50 MG PO TABS
50.0000 mg | ORAL_TABLET | ORAL | Status: DC | PRN
  Administered 2022-06-15 – 2022-06-16 (×3): 100 mg via ORAL
  Administered 2022-06-16 – 2022-06-17 (×2): 50 mg via ORAL
  Administered 2022-06-21 (×2): 100 mg via ORAL
  Filled 2022-06-14 (×2): qty 2
  Filled 2022-06-14: qty 1
  Filled 2022-06-14: qty 2
  Filled 2022-06-14: qty 1
  Filled 2022-06-14 (×2): qty 2

## 2022-06-14 MED ORDER — PROPOFOL 10 MG/ML IV BOLUS
INTRAVENOUS | Status: DC | PRN
  Administered 2022-06-14 (×2): 40 mg via INTRAVENOUS

## 2022-06-14 MED ORDER — PANTOPRAZOLE SODIUM 40 MG PO TBEC
40.0000 mg | DELAYED_RELEASE_TABLET | Freq: Every day | ORAL | Status: DC
  Administered 2022-06-16 – 2022-06-22 (×7): 40 mg via ORAL
  Filled 2022-06-14 (×7): qty 1

## 2022-06-14 MED ORDER — LACTATED RINGERS IV SOLN: INTRAVENOUS | Status: DC

## 2022-06-14 MED ORDER — PHENYLEPHRINE HCL-NACL 20-0.9 MG/250ML-% IV SOLN
0.0000 ug/min | INTRAVENOUS | Status: DC
  Filled 2022-06-14: qty 250

## 2022-06-14 MED ORDER — ROCURONIUM BROMIDE 10 MG/ML (PF) SYRINGE
PREFILLED_SYRINGE | INTRAVENOUS | Status: DC | PRN
  Administered 2022-06-14: 100 mg via INTRAVENOUS
  Administered 2022-06-14: 50 mg via INTRAVENOUS
  Administered 2022-06-14: 10 mg via INTRAVENOUS

## 2022-06-14 MED ORDER — MIDAZOLAM HCL (PF) 10 MG/2ML IJ SOLN
INTRAMUSCULAR | Status: AC
  Filled 2022-06-14: qty 2

## 2022-06-14 MED ORDER — SODIUM CHLORIDE 0.9% FLUSH
10.0000 mL | Freq: Two times a day (BID) | INTRAVENOUS | Status: DC
  Administered 2022-06-14 – 2022-06-21 (×8): 10 mL

## 2022-06-14 MED ORDER — ALBUMIN HUMAN 5 % IV SOLN
250.0000 mL | INTRAVENOUS | Status: AC | PRN
  Administered 2022-06-14 (×4): 12.5 g via INTRAVENOUS
  Filled 2022-06-14 (×2): qty 250

## 2022-06-14 MED ORDER — CHLORHEXIDINE GLUCONATE 4 % EX LIQD
30.0000 mL | CUTANEOUS | Status: DC
  Filled 2022-06-14: qty 30

## 2022-06-14 MED ORDER — CHLORHEXIDINE GLUCONATE CLOTH 2 % EX PADS
6.0000 | MEDICATED_PAD | Freq: Every day | CUTANEOUS | Status: DC
  Administered 2022-06-14 – 2022-06-20 (×6): 6 via TOPICAL

## 2022-06-14 MED ORDER — ORAL CARE MOUTH RINSE
15.0000 mL | OROMUCOSAL | Status: DC
  Administered 2022-06-14 (×2): 15 mL via OROMUCOSAL

## 2022-06-14 MED ORDER — INSULIN REGULAR(HUMAN) IN NACL 100-0.9 UT/100ML-% IV SOLN: INTRAVENOUS | Status: DC

## 2022-06-14 MED ORDER — DEXTROSE 50 % IV SOLN
25.0000 mL | Freq: Once | INTRAVENOUS | Status: AC
  Filled 2022-06-14: qty 50

## 2022-06-14 MED ORDER — MIDAZOLAM HCL 2 MG/2ML IJ SOLN: 2.0000 mg | INTRAMUSCULAR | Status: DC | PRN

## 2022-06-14 MED ORDER — MORPHINE SULFATE (PF) 2 MG/ML IV SOLN
1.0000 mg | INTRAVENOUS | Status: DC | PRN
  Administered 2022-06-14 – 2022-06-15 (×4): 2 mg via INTRAVENOUS
  Filled 2022-06-14 (×4): qty 1

## 2022-06-14 MED ORDER — DEXTROSE 50 % IV SOLN: 0.0000 mL | INTRAVENOUS | Status: DC | PRN

## 2022-06-14 MED ORDER — METOPROLOL TARTRATE 25 MG/10 ML ORAL SUSPENSION: 12.5000 mg | Freq: Two times a day (BID) | ORAL | Status: DC

## 2022-06-14 MED ORDER — DOCUSATE SODIUM 100 MG PO CAPS
200.0000 mg | ORAL_CAPSULE | Freq: Every day | ORAL | Status: DC
  Administered 2022-06-15 – 2022-06-22 (×8): 200 mg via ORAL
  Filled 2022-06-14 (×8): qty 2

## 2022-06-14 MED ORDER — SODIUM CHLORIDE 0.9% FLUSH
3.0000 mL | INTRAVENOUS | Status: DC | PRN
  Administered 2022-06-19: 3 mL via INTRAVENOUS

## 2022-06-14 MED ORDER — CHLORHEXIDINE GLUCONATE 0.12 % MT SOLN: 15.0000 mL | Freq: Once | OROMUCOSAL | Status: DC

## 2022-06-14 SURGICAL SUPPLY — 75 items
ADAPTER CARDIO PERF ANTE/RETRO (ADAPTER) ×2 IMPLANT
ADPR CRDPLG 7.5 .25D 1 LRG Y (ADAPTER) ×2
ADPR PRFSN 84XANTGRD RTRGD (ADAPTER) ×2
APL SWBSTK 6 STRL LF DISP (MISCELLANEOUS) ×2
APPLICATOR COTTON TIP 6 STRL (MISCELLANEOUS) IMPLANT
APPLICATOR COTTON TIP 6IN STRL (MISCELLANEOUS) ×2
BAG DECANTER FOR FLEXI CONT (MISCELLANEOUS) ×2 IMPLANT
BLADE CLIPPER SURG (BLADE) ×2 IMPLANT
BLADE STERNUM SYSTEM 6 (BLADE) ×2 IMPLANT
CANISTER SUCT 3000ML PPV (MISCELLANEOUS) ×2 IMPLANT
CANNULA MC2 2 STG 36/46 NON-V (CANNULA) IMPLANT
CANNULA NON VENT 20FR 12 (CANNULA) ×2 IMPLANT
CATH HEART VENT LEFT (CATHETERS) ×2 IMPLANT
CATH RETROPLEGIA CORONARY 14FR (CATHETERS) ×2 IMPLANT
CATH ROBINSON RED A/P 18FR (CATHETERS) ×6 IMPLANT
CATH THOR STR 32F SOFT 20 RADI (CATHETERS) ×2 IMPLANT
CATH THORACIC 28FR RT ANG (CATHETERS) ×2 IMPLANT
CLIP TI MEDIUM 24 (CLIP) IMPLANT
CLIP TI WIDE RED SMALL 24 (CLIP) IMPLANT
DEVICE SUT CK QUICK LOAD MINI (Prosthesis & Implant Heart) IMPLANT
DRAPE SLUSH/WARMER DISC (DRAPES) IMPLANT
DRSG AQUACEL AG ADV 3.5X10 (GAUZE/BANDAGES/DRESSINGS) ×2 IMPLANT
DRSG AQUACEL AG ADV 3.5X14 (GAUZE/BANDAGES/DRESSINGS) IMPLANT
ELECT CAUTERY BLADE 6.4 (BLADE) ×2 IMPLANT
ELECT REM PT RETURN 9FT ADLT (ELECTROSURGICAL) ×4
ELECTRODE REM PT RTRN 9FT ADLT (ELECTROSURGICAL) ×4 IMPLANT
FELT TEFLON 1X6 (MISCELLANEOUS) ×4 IMPLANT
GAUZE SPONGE 4X4 12PLY STRL (GAUZE/BANDAGES/DRESSINGS) ×4 IMPLANT
GOWN STRL REUS W/ TWL LRG LVL3 (GOWN DISPOSABLE) ×12 IMPLANT
GOWN STRL REUS W/TWL LRG LVL3 (GOWN DISPOSABLE) ×12
HEMOSTAT SURGICEL 2X14 (HEMOSTASIS) IMPLANT
INSERT FOGARTY XLG (MISCELLANEOUS) ×2 IMPLANT
KIT BASIN OR (CUSTOM PROCEDURE TRAY) ×2 IMPLANT
KIT SUCTION CATH 14FR (SUCTIONS) ×2 IMPLANT
KIT SUT CK MINI COMBO 4X17 (Prosthesis & Implant Heart) IMPLANT
KIT TURNOVER KIT B (KITS) ×2 IMPLANT
LINE VENT (MISCELLANEOUS) IMPLANT
MASK FACE 3 LYR ANT FOG FR FLM (MASK) IMPLANT
MASK SURG FACE ANTI FOG ADLT (MASK) ×2
NS IRRIG 1000ML POUR BTL (IV SOLUTION) ×10 IMPLANT
ORGANIZER SUTURE GABBAY-FRATER (MISCELLANEOUS) ×2 IMPLANT
PACK E OPEN HEART (SUTURE) ×2 IMPLANT
PACK OPEN HEART (CUSTOM PROCEDURE TRAY) ×2 IMPLANT
PAD ARMBOARD 7.5X6 YLW CONV (MISCELLANEOUS) ×4 IMPLANT
PAD ELECT DEFIB RADIOL ZOLL (MISCELLANEOUS) ×2 IMPLANT
PENCIL BUTTON HOLSTER BLD 10FT (ELECTRODE) ×2 IMPLANT
POSITIONER HEAD DONUT 9IN (MISCELLANEOUS) ×2 IMPLANT
PUNCH AORTIC ROTATE 4.5MM 8IN (MISCELLANEOUS) ×2 IMPLANT
SET MPS 3-ND DEL (MISCELLANEOUS) IMPLANT
SET Y-VENT ADPTR 7.5 MALE LUER (ADAPTER) IMPLANT
SUT BONE WAX W31G (SUTURE) ×2 IMPLANT
SUT EB EXC GRN/WHT 2-0 V-5 (SUTURE) ×4 IMPLANT
SUT ETHIBOND 2-0 30 1/2 V-5 (SUTURE) IMPLANT
SUT MNCRL AB 4-0 PS2 18 (SUTURE) ×4 IMPLANT
SUT PROLENE 4 0 RB 1 (SUTURE) ×6
SUT PROLENE 4 0 SH DA (SUTURE) ×2 IMPLANT
SUT PROLENE 4-0 RB1 .5 CRCL 36 (SUTURE) IMPLANT
SUT SILK 2 0 SH CR/8 (SUTURE) IMPLANT
SUT STEEL 6MS V (SUTURE) IMPLANT
SUT STEEL SZ 6 DBL 3X14 BALL (SUTURE) ×4 IMPLANT
SUT VIC AB 0 CTX 36 (SUTURE) ×4
SUT VIC AB 0 CTX36XBRD ANTBCTR (SUTURE) ×4 IMPLANT
SUT VIC AB 2-0 CT1 27 (SUTURE) ×4
SUT VIC AB 2-0 CT1 TAPERPNT 27 (SUTURE) ×4 IMPLANT
SYSTEM SAHARA CHEST DRAIN ATS (WOUND CARE) ×2 IMPLANT
TAPE CLOTH SURG 4X10 WHT LF (GAUZE/BANDAGES/DRESSINGS) IMPLANT
TAPE PAPER 2X10 WHT MICROPORE (GAUZE/BANDAGES/DRESSINGS) IMPLANT
TOWEL GREEN STERILE (TOWEL DISPOSABLE) ×2 IMPLANT
TOWEL GREEN STERILE FF (TOWEL DISPOSABLE) ×2 IMPLANT
TRAY FOLEY SLVR 14FR TEMP STAT (SET/KITS/TRAYS/PACK) IMPLANT
TRAY FOLEY SLVR 16FR TEMP STAT (SET/KITS/TRAYS/PACK) ×2 IMPLANT
UNDERPAD 30X36 HEAVY ABSORB (UNDERPADS AND DIAPERS) ×2 IMPLANT
VALVE REGENT 21MM (Valve) IMPLANT
VENT LEFT HEART 12002 (CATHETERS) ×2
WATER STERILE IRR 1000ML POUR (IV SOLUTION) ×4 IMPLANT

## 2022-06-14 NOTE — Anesthesia Postprocedure Evaluation (Signed)
Anesthesia Post Note  Patient: Magazine features editor  Procedure(s) Performed: AORTIC VALVE REPLACEMENT (AVR) USING SJM REGENT MECHANICAL HEART VALVE SIZE (Chest) TRANSESOPHAGEAL ECHOCARDIOGRAM     Patient location during evaluation: SICU Anesthesia Type: General Level of consciousness: sedated and patient remains intubated per anesthesia plan Pain management: pain level controlled Vital Signs Assessment: post-procedure vital signs reviewed and stable Respiratory status: spontaneous breathing and patient remains intubated per anesthesia plan Cardiovascular status: stable Anesthetic complications: no   No notable events documented.  Last Vitals:  Vitals:   06/14/22 1500 06/14/22 1515  BP: 119/75   Pulse: 88 88  Resp: (!) 23 18  Temp: 37 C 37.1 C  SpO2: 98% 97%    Last Pain:  Vitals:   06/14/22 0546  TempSrc: Oral                 Lewie Loron

## 2022-06-14 NOTE — Progress Notes (Signed)
Patient ID: Chelsea English, female   DOB: [birth year], 62 y.o.   MRN: [id number]  TCTS Evening Rounds:   Hemodynamically stable on neo CI = 2  Just extubated  Urine output good  CT output low  CBC    Component Value Date/Time   WBC 14.6 (H) 06/14/2022 1156   RBC 3.09 (L) 06/14/2022 1156   HGB 8.8 (L) 06/14/2022 1721   HCT 26.0 (L) 06/14/2022 1721   PLT 140 (L) 06/14/2022 1156   MCV 82.2 06/14/2022 1156   MCH 27.8 06/14/2022 1156   MCHC 33.9 06/14/2022 1156   RDW 12.7 06/14/2022 1156   LYMPHSABS 2.0 04/16/2022 0056   MONOABS 0.6 04/16/2022 0056   EOSABS 0.1 04/16/2022 0056   BASOSABS 0.0 04/16/2022 0056     BMET    Component Value Date/Time   NA 138 06/14/2022 1721   K 5.7 (H) 06/14/2022 1721   CL 101 06/14/2022 1043   CO2 21 (L) 06/10/2022 1355   GLUCOSE 141 (H) 06/14/2022 1043   BUN 44 (H) 06/14/2022 1043   CREATININE 1.40 (H) 06/14/2022 1043   CALCIUM 9.1 06/10/2022 1355   GFRNONAA 32 (L) 06/10/2022 1355     A/P:  Stable postop course. Continue current plans

## 2022-06-14 NOTE — Anesthesia Procedure Notes (Signed)
Central Venous Catheter Insertion Performed by: Lewie Loron, MD, anesthesiologist Start/End4/23/2024 7:18 AM, 06/14/2022 7:23 AM Patient location: Pre-op. Preanesthetic checklist: patient identified, IV checked, site marked, risks and benefits discussed, surgical consent, monitors and equipment checked, pre-op evaluation, timeout performed and anesthesia consent Position: supine Hand hygiene performed  and maximum sterile barriers used  PA cath was placed.Swan type:thermodilution PA Cath depth:45 Procedure performed without using ultrasound guided technique. Attempts: 1 Post procedure assessment: no air and free fluid flow  Patient tolerated the procedure well with no immediate complications.

## 2022-06-14 NOTE — Transfer of Care (Signed)
Immediate Anesthesia Transfer of Care Note  Patient: Chelsea English  Procedure(s) Performed: AORTIC VALVE REPLACEMENT (AVR) USING SJM REGENT MECHANICAL HEART VALVE SIZE (Chest) TRANSESOPHAGEAL ECHOCARDIOGRAM  Patient Location: SICU  Anesthesia Type:General  Level of Consciousness: sedated and Patient remains intubated per anesthesia plan  Airway & Oxygen Therapy: Patient remains intubated per anesthesia plan and Patient placed on Ventilator (see vital sign flow sheet for setting)  Post-op Assessment: Report given to RN and Post -op Vital signs reviewed and stable  Post vital signs: Reviewed and stable  Last Vitals:  Vitals Value Taken Time  BP    Temp    Pulse 88 06/14/22 1141  Resp 16 06/14/22 1141  SpO2 100 % 06/14/22 1141  Vitals shown include unvalidated device data.  Last Pain:  Vitals:   06/14/22 0546  TempSrc: Oral         Complications: No notable events documented.

## 2022-06-14 NOTE — Brief Op Note (Signed)
06/14/2022  9:46 AM  PATIENT:  Chelsea English  62 y.o. female  PRE-OPERATIVE DIAGNOSIS:  SEVERE AS  POST-OPERATIVE DIAGNOSIS:  SEVERE AS  PROCEDURE:  Procedure(s): AORTIC VALVE REPLACEMENT (AVR) USING SJM REGENT MECHANICAL HEART VALVE SIZE (N/A) TRANSESOPHAGEAL ECHOCARDIOGRAM (N/A)  SURGEON:  Surgeon(s) and Role:    Eugenio Hoes, MD - Primary  PHYSICIAN ASSISTANT: Mohd Clemons PA-C  ASSISTANTS: RNFA   ANESTHESIA:   general  EBL:  880 mL   BLOOD ADMINISTERED:none  DRAINS:  LEFT PLEURAL AND MEDIASTINAL CHEST TUBE    LOCAL MEDICATIONS USED:  NONE  SPECIMEN:  No Specimen  DISPOSITION OF SPECIMEN:  N/A  COUNTS:  YES  TOURNIQUET:  * No tourniquets in log *  DICTATION: .Dragon Dictation  PLAN OF CARE: Admit to inpatient   PATIENT DISPOSITION:  ICU - intubated and hemodynamically stable.   Delay start of Pharmacological VTE agent (>24hrs) due to surgical blood loss or risk of bleeding: yes  COMPLICATIONS: NO KNOWN

## 2022-06-14 NOTE — Op Note (Signed)
CARDIOVASCULAR SURGERY OPERATIVE NOTE  06/14/2022 Chelsea English [id number]  Surgeon:  Ashley Akin, MD  First Assistant: Gershon Crane [facility]                               An experienced assistant was required given the complexity of this surgery and the standard of surgical care. The assistant was needed for exposure, dissection, suctioning, retraction of delicate tissues and sutures, instrument exchange and for overall help during this procedure.     Preoperative Diagnosis:  Aortic Stenosis  Postoperative Diagnosis:  Same   Procedure: Aortic Valve Replacement with a 21mm St Jude Mechanical prosthesis (SN [id number])  Anesthesia:  General Endotracheal   Clinical History/Surgical Indication:  Pt is a 62 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 Pt and family ( with interpreter present) were given the reasons for recommendation for mechanical AVR currently to avoid a potential issue in the future. They understand that SAVR has higher up front concerns over mortality and renal dysfunction but avoids potential for need for TAVR valve in valve in future with anatomical concern for coronary obstruction. Pt and family favor SAVR   Findings:   The Heart was not enlarged. The valve was trileaflet and moderately calcified. At the conclusion of procedure pt was in an A-paced rhythm and with normal LV function and with a mean gradient of with a well seated valve   Media Information          Cross Clamp time;70 min CPB time: 89 min  Preparation:  The patient was seen in the preoperative holding area and the correct patient, correct operation were confirmed with the patient after reviewing the medical record and catheterization. The consent was signed by me. Preoperative antibiotics were given. A pulmonary arterial line and radial arterial line were placed by the anesthesia team. The patient was taken back to the operating room and  positioned supine on the operating room table. After being placed under general endotracheal anesthesia by the anesthesia team a foley catheter was placed. The neck, chest, abdomen, and both legs were prepped with betadine soap and solution and draped in the usual sterile manner. A surgical time-out was taken and the correct patient and operative procedure were confirmed with the nursing and anesthesia staff.  Operation:  Median sternotomy incision was then created and sternum divided with the sternal saw.  A pericardial well was developed and heparin was delivered.  Aorta was cannulated with a 20 French aortic cannula and a two-stage cannula was placed right atrium for venous return.  An antegrade cardioplegia catheter was placed in the ascending aorta and a left ventricular sump placed across right superior pulmonary vein. With adequate confirmation of anticoagulation cardiopulmonary bypass was instituted.  The aortic cross-clamp was placed and cold [place] blood cardioplegia was delivered for 1200 cc.  The aorta was opened horizontally and the aortic valve was identified, photographed, and then resected.  The annulus was completely debrided of calcium and the left ventricular cavity was irrigated with a saline solution. The annulus was sized to a 21 mm region White Jude mechanical heart valve and utilizing fifteen 2-0 T[place]k pledgeted sutures with the pledgets on the ventricular surface the valve was secured with the cor knot system.  Supporting external sutures of 4 oh pledgeted Prolene were then performed in the noncoronary cusp to ensure a good seal.  The aorta  was then closed with a running pledgeted 4-0 Prolene suture and the patient in headdown position aortic cross-clamp was removed. Ventricular and atrial pacing wires were placed and brought through inferior stab wounds and secured.  With adequate de-airing the left ventricular sump was removed and the patient was weaned from cardiopulmonary  bypass on inotropic support.  With adequate hemodynamics protamine was delivered the patient was decannulated and sites oversewn were necessary. With adequate hemostasis chest tubes were brought inferior stab was and secured and sternum was reapproximated with interrupted stainless steel wires and the presternal subcutaneous tissue and skin were closed in multiple layers absorbable suture.  Sterile dressings were applied.

## 2022-06-14 NOTE — Anesthesia Procedure Notes (Signed)
Arterial Line Insertion Start/End4/23/2024 6:55 AM, 06/14/2022 7:05 AM Performed by: Lanell Matar, CRNA  Patient location: Pre-op. Preanesthetic checklist: patient identified, IV checked, site marked, risks and benefits discussed, surgical consent, monitors and equipment checked, pre-op evaluation, timeout performed and anesthesia consent Right, radial was placed Catheter size: 20 G Hand hygiene performed  and maximum sterile barriers used  Allen's test indicative of satisfactory collateral circulation Attempts: 1 Procedure performed without using ultrasound guided technique. Following insertion, dressing applied and Biopatch. Post procedure assessment: normal  Patient tolerated the procedure well with no immediate complications.

## 2022-06-14 NOTE — Hospital Course (Addendum)
Patwardhan, Anabel Bene, MD [place], Mady Haagensen, PA-C    History of present illness:  The patient is a 62 year old female referred to Dr. Leafy Ro with critical aortic stenosis.  She was initially evaluated for TAVR however following chest CTA she was noted to have potential issues in the future if valve deterioration occurred and surgery was again readdressed and ultimately it was concluded that this would be her best option.  She was admitted this hospitalization electively for the procedure.  Hospital course:  The patient was admitted electively and taken the operating room on 06/14/2022 at which time she underwent aortic valve replacement using a Probation officer prosthetic.  Size 21 mm.  She tolerated the procedure well was taken to the surgical intensive care unit in stable condition.  Vital signs and hemodynamics remained stable.  6:30 PM on day of surgery.  He had a slow junctional rhythm so pacing was continued utilizing epicardial pacing leads.  Electrophysiology service was consulted when she developed second-degree heart block.  Continue observation with normalization of potassium was recommended.  She eventually had return with PACs.  Glucose was monitored carefully and.  This was later transitioned to Levemir and sliding scale insulin.  Continue to remove the old off on initiating only postoperatively thrombocytopenia.  After platelet count recovered to around 100,000 Coumadin was started 5 mg daily.  INR was monitored daily.  Most recent platelet count on 06/21/2022 was 149 K.  Diuresis was initiated for expected volume excess.  She had an expected rise in her creatinine of her history of stage IIIb kidney disease.  Creatinine peaked at 3.  She was diuresed over the course of several days with appropriate response.  Creatinine has continued to improve and on 06/21/2022 was 1.73.  The patient wires were removed on postop day 5.  PT  and OT assessments were requested to assist with  discharge planning.  She was also being evaluated for need for home oxygen.  She does wear home oxygen at night but has had some desaturations with ambulation and did qualify.  These arrangements will be made prior to discharge.  Incisions are noted to be healing well without evidence of infection.  She is tolerating diet and routine cardiac rehab modalities albeit somewhat slow to progress.  Overall, at the time of discharge the patient was felt to be quite stable.

## 2022-06-14 NOTE — Anesthesia Procedure Notes (Signed)
Procedure Name: Intubation Date/Time: 06/14/2022 7:53 AM  Performed by: Lanell Matar, CRNAPre-anesthesia Checklist: Patient identified, Emergency Drugs available, Suction available and Patient being monitored Patient Re-evaluated:Patient Re-evaluated prior to induction Oxygen Delivery Method: Circle System Utilized Preoxygenation: Pre-oxygenation with 100% oxygen Induction Type: IV induction Ventilation: Mask ventilation without difficulty, Oral airway inserted - appropriate to patient size and Two handed mask ventilation required Laryngoscope Size: Miller and 2 Grade View: Grade I Tube type: Oral Tube size: 8.0 mm Number of attempts: 1 Airway Equipment and Method: Stylet and Oral airway Placement Confirmation: ETT inserted through vocal cords under direct vision, positive ETCO2 and breath sounds checked- equal and bilateral Secured at: 22 cm Tube secured with: Tape Dental Injury: Teeth and Oropharynx as per pre-operative assessment

## 2022-06-14 NOTE — Anesthesia Procedure Notes (Signed)
Central Venous Catheter Insertion Performed by: Lewie Loron, MD, anesthesiologist Start/End4/23/2024 6:58 AM, 06/14/2022 7:18 AM Patient location: Pre-op. Preanesthetic checklist: patient identified, IV checked, site marked, risks and benefits discussed, surgical consent, monitors and equipment checked, pre-op evaluation, timeout performed and anesthesia consent Position: Trendelenburg Lidocaine 1% used for infiltration and patient sedated Hand hygiene performed  and maximum sterile barriers used  Catheter size: 8.5 Fr Sheath introducer Procedure performed using ultrasound guided technique. Ultrasound Notes:anatomy identified, needle tip was noted to be adjacent to the nerve/plexus identified, no ultrasound evidence of intravascular and/or intraneural injection and image(s) printed for medical record Attempts: 1 Following insertion, line sutured, dressing applied and Biopatch. Post procedure assessment: blood return through all ports, free fluid flow and no air  Patient tolerated the procedure well with no immediate complications.

## 2022-06-14 NOTE — Interval H&P Note (Signed)
History and Physical Interval Note:  06/14/2022 6:27 AM  Chelsea English  has presented today for surgery, with the diagnosis of SEVERE AS.  The various methods of treatment have been discussed with the patient and family. After consideration of risks, benefits and other options for treatment, the patient has consented to  Procedure(s): AORTIC VALVE REPLACEMENT (AVR) (N/A) TRANSESOPHAGEAL ECHOCARDIOGRAM (N/A) as a surgical intervention.  The patient's history has been reviewed, patient examined, no change in status, stable for surgery.  I have reviewed the patient's chart and labs.  Questions were answered to the patient's satisfaction.     Eugenio Hoes

## 2022-06-14 NOTE — Procedures (Signed)
Extubation Procedure Note  Patient Details:   Name: Chelsea English DOB: 02-06-1961 MRN: [id number]   Airway Documentation:    Vent end date: 06/14/22 Vent end time: 1831   Evaluation  O2 sats: stable throughout Complications: No apparent complications Patient did tolerate procedure well. Bilateral Breath Sounds: Clear   Yes  [name] 06/14/2022, 6:32 PM Extubate to 4L .

## 2022-06-14 NOTE — Consult Note (Addendum)
NAME:  Chelsea English, MRN:  [id number], DOB:  [birth year], LOS: 0 ADMISSION DATE:  06/14/2022, CONSULTATION DATE:  4/23 REFERRING MD:  Dr. Leafy Ro CHIEF COMPLAINT: Severe aortic stenosis  History of Present Illness:   Ms. Chelsea English is a 62 y/o person livning with a history of severe aortic stenosis, HTN, CKD stg 3b, T2DM who presented for elective aortic valve replacement by Dr. Leafy Ro, admitted to the ICU for continued care and ventilatory management    Pertinent  Medical History  Severe aortic stenosis HFpEF CKD3b T2DM HTN  Significant Hospital Events: Including procedures, antibiotic start and stop dates in addition to other pertinent events   4/24 mechanical aortic valve replacement, transferred to CICU  Interim History / Subjective:   S/p aortic valve repair. No complications during the procedure, sedated.   Objective   Blood pressure (!) 158/76, pulse 83, temperature 98.5 F (36.9 C), temperature source Oral, resp. rate 16, height 5\' 2"  (1.575 m), weight 90.7 kg, SpO2 97 %.    Vent Mode: SIMV;PSV;PRVC FiO2 (%):  [50 %] 50 % Set Rate:  [16 bmp] 16 bmp Vt Set:  [400 mL] 400 mL PEEP:  [5 cmH20] 5 cmH20 Pressure Support:  [10 cmH20] 10 cmH20 Plateau Pressure:  [19 cmH20] 19 cmH20   Intake/Output Summary (Last 24 hours) at 06/14/2022 1215 Last data filed at 06/14/2022 1122 Gross per 24 hour  Intake 2535 ml  Output 1965 ml  Net 570 ml   Filed Weights   06/14/22 0546  Weight: 90.7 kg   Examination: General: obese HENT: ETT in place Lungs: clear ot auscultation bilaterally Cardiovascular: regular rate and rhythm. No murmurs gallops rubs. Sternotomy bandage, drain in place Neuro: sedated GU: foley in place  Resolved Hospital Problem list   N/a  Assessment & Plan:   Severe aortic stenosis S/p mechanical aortic valve replacement 4/23 Post op day 0, tolerated procedure well. Continue to monitor drain output, start anticoagulation per primary - management per Dr.  Leafy Ro and team - phenylephrine for pressure support  Postoperative vent management SIMV, peep of 5. Will work to wean and extubate if able.  -VAP protocol -wean and extubate this evening if does well on SIMV -precedex for sedation  CKDIIIb Baseline creatinine appears to be 1.7. Follow up labs  Normocytic anemia Likely secondary to renal failure. Would recommend repeat iron studies after admission. Hgb down to 6.8 s/p procedure. Received 1 U of PRBC in surgery, follow up post transfusion h/h.   T2DM Last A1c of 9.9% -ssi per primary once extubated  HTN Hold in setting of recent aortic valve repair  Best Practice (right click and "Reselect all SmartList Selections" daily)   Diet/type: NPO DVT prophylaxis: per primary GI prophylaxis: famotidine  Foley:  Yes, and it is still needed Code Status:  full code  Labs   CBC: Recent Labs  Lab 06/10/22 1355 06/14/22 0811 06/14/22 0928 06/14/22 1001 06/14/22 1004 06/14/22 1039 06/14/22 1043  WBC 9.7  --   --   --   --   --   --   HGB 9.7*   < > 7.5* 7.1* 6.4* 7.1* 8.2*  HCT 29.5*   < > 22.0* 21.0* 19.2* 21.0* 24.0*  MCV 83.1  --   --   --   --   --   --   PLT 221  --   --   --  164  --   --    < > = values in this interval not displayed.  Basic Metabolic Panel: Recent Labs  Lab 06/10/22 1355 06/14/22 0811 06/14/22 0814 06/14/22 0841 06/14/22 0900 06/14/22 0907 06/14/22 0928 06/14/22 1001 06/14/22 1039 06/14/22 1043  NA 131*   < > 139 137   < > 138 136 136 137 137  K 4.3   < > 4.2 4.2   < > 4.1 4.8 4.8 4.8 4.9  CL 98  --  102 100  --   --  100  --   --  101  CO2 21*  --   --   --   --   --   --   --   --   --   GLUCOSE 340*  --  122* 139*  --   --  125*  --   --  141*  BUN 63*  --  45* 48*  --   --  48*  --   --  44*  CREATININE 1.77*  --  1.60* 1.60*  --   --  1.50*  --   --  1.40*  CALCIUM 9.1  --   --   --   --   --   --   --   --   --    < > = values in this interval not displayed.   GFR: Estimated  Creatinine Clearance: 43.6 mL/min (A) (by C-G formula based on SCr of 1.4 mg/dL (H)). Recent Labs  Lab 06/10/22 1355  WBC 9.7    Liver Function Tests: Recent Labs  Lab 06/10/22 1355  AST 32  ALT 38  ALKPHOS 162*  BILITOT 0.4  PROT 7.6  ALBUMIN 3.5   No results for input(s): "LIPASE", "AMYLASE" in the last 168 hours. No results for input(s): "AMMONIA" in the last 168 hours.  ABG    Component Value Date/Time   PHART 7.448 06/14/2022 1039   PCO2ART 35.7 06/14/2022 1039   PO2ART 373 (H) 06/14/2022 1039   HCO3 24.7 06/14/2022 1039   TCO2 26 06/14/2022 1043   ACIDBASEDEF 1.0 06/14/2022 0907   O2SAT 100 06/14/2022 1039     Coagulation Profile: Recent Labs  Lab 06/10/22 1355  INR 1.0    Cardiac Enzymes: No results for input(s): "CKTOTAL", "CKMB", "CKMBINDEX", "TROPONINI" in the last 168 hours.  HbA1C: Hgb A1c MFr Bld  Date/Time Value Ref Range Status  06/10/2022 01:55 PM 9.9 (H) 4.8 - 5.6 % Final    Comment:    (NOTE) Pre diabetes:          5.7%-6.4%  Diabetes:              >6.4%  Glycemic control for   <7.0% adults with diabetes   04/15/2022 10:07 PM 6.8 (H) 4.8 - 5.6 % Final    Comment:    (NOTE) Pre diabetes:          5.7%-6.4%  Diabetes:              >6.4%  Glycemic control for   <7.0% adults with diabetes     CBG: Recent Labs  Lab 06/10/22 1325 06/14/22 0548 06/14/22 1149  GLUCAP 311* 66* 144*    Review of Systems:   Sedated, unable to perform  Past Medical History:  She,  has a past medical history of Anemia, Aortic stenosis, Coronary artery disease, Diabetes mellitus without complication, Heart murmur, Hyperlipidemia, Hypertension, and Oxygen dependent.   Surgical History:   Past Surgical History:  Procedure Laterality Date   ARTERY BIOPSY Right 07/20/2021   Procedure: BIOPSY  OF RIGHT TEMPORAL ARTERY;  Surgeon: Chuck Hint, MD;  Location: [facility] OR;  Service: Vascular;  Laterality: Right;   cataract Right    HYSTEROSCOPY  WITH D & C     removal of polyp   MULTIPLE TOOTH EXTRACTIONS     no teeth, no dentures   RIGHT AND LEFT HEART CATH N/A 04/19/2022   Procedure: RIGHT AND LEFT HEART CATH;  Surgeon: [name], MD;  Location: [facility] INVASIVE CV LAB;  Service: Cardiovascular;  Laterality: N/A;     Social History:   reports that she has never smoked. She has never used smokeless tobacco. She reports that she does not drink alcohol and does not use drugs.   Family History:  Her family history is not on file.   Allergies Allergies  Allergen Reactions   Penicillins Anaphylaxis     Home Medications  Prior to Admission medications   Medication Sig Start Date End Date Taking? Authorizing Provider  amLODipine (NORVASC) 5 MG tablet Take 5 mg by mouth daily. 04/28/22  Yes [provider]  aspirin 81 MG chewable tablet Chew 81 mg by mouth daily. 07/06/18  Yes [provider]  atorvastatin (LIPITOR) 40 MG tablet Take 40 mg by mouth at bedtime. 06/01/21  Yes [provider]  BD INSULIN SYRINGE U/F 31G X 5/16" 1 ML MISC Inject 1 each into the skin 3 (three) times daily. Use to inject unsulin 02/07/22  Yes [provider]  benzonatate (TESSALON) 100 MG capsule Take 100 mg by mouth 2 (two) times daily as needed for cough. 07/12/21  Yes [provider]  carvedilol (COREG) 3.125 MG tablet Take 1 tablet (3.125 mg total) by mouth 2 (two) times daily with a meal. 05/19/22  Yes Orbie Pyo, MD  cyclobenzaprine (FLEXERIL) 5 MG tablet Take 10 mg by mouth 2 (two) times daily as needed for muscle spasms. 07/06/21  Yes [provider]  ferrous sulfate 325 (65 FE) MG tablet Take 650 mg by mouth daily with breakfast.   Yes [provider]  furosemide (LASIX) 40 MG tablet Take 40 mg by mouth daily. 05/18/22  Yes [provider]  gabapentin (NEURONTIN) 100 MG capsule Take 100 mg by mouth daily. 07/06/21  Yes [provider]  insulin NPH-regular Human  (HUMULIN 70/30) (70-30) 100 UNIT/ML injection Inject 40-60 Units into the skin See admin instructions. Injecting 60 units in the morning and 40 units at bedtime. 10/20/17  Yes [provider]  JANUVIA 100 MG tablet Take 100 mg by mouth daily. 07/05/21  Yes [provider]  Multiple Vitamins-Minerals (MULTI FOR HER 50+ PO) Take 1 tablet by mouth daily.   Yes [provider]  polyethylene glycol (MIRALAX / GLYCOLAX) 17 g packet Take 17 g by mouth daily as needed for moderate constipation.   Yes [provider]    Thalia Bloodgood DO  Internal Medicine Resident PGY-3 [place]  Pager: [phone]  Attending Addendum I saw and evaluated the patient. Discussed with resident and agree with resident's findings and plan as documented in the resident's note.  Uncomplicated mechanical AVR Drains minimal output, lungs clear, ext warm Some urine in foley Follow usual vent wean pathway AC resumption at TCTS discretion Conservative transfusion strategy Precedex+PRN fent Will follow while in ICU  31 min cc time  Myrla Halsted MD [facility] Prefer epic messenger for cross cover needs If after hours, please call E-link

## 2022-06-14 NOTE — Telephone Encounter (Signed)
OPENED IN ERROR, CLEARANCE IS DONE

## 2022-06-15 ENCOUNTER — Encounter (HOSPITAL_COMMUNITY): Payer: Self-pay | Admitting: Thoracic Surgery (Cardiothoracic Vascular Surgery)

## 2022-06-15 ENCOUNTER — Inpatient Hospital Stay (HOSPITAL_COMMUNITY): Payer: Medicaid Other

## 2022-06-15 DIAGNOSIS — Z952 Presence of prosthetic heart valve: Secondary | ICD-10-CM

## 2022-06-15 LAB — GLUCOSE, CAPILLARY
Glucose-Capillary: 119 mg/dL — ABNORMAL HIGH (ref 70–99)
Glucose-Capillary: 119 mg/dL — ABNORMAL HIGH (ref 70–99)
Glucose-Capillary: 120 mg/dL — ABNORMAL HIGH (ref 70–99)
Glucose-Capillary: 132 mg/dL — ABNORMAL HIGH (ref 70–99)
Glucose-Capillary: 133 mg/dL — ABNORMAL HIGH (ref 70–99)
Glucose-Capillary: 134 mg/dL — ABNORMAL HIGH (ref 70–99)
Glucose-Capillary: 134 mg/dL — ABNORMAL HIGH (ref 70–99)
Glucose-Capillary: 138 mg/dL — ABNORMAL HIGH (ref 70–99)
Glucose-Capillary: 142 mg/dL — ABNORMAL HIGH (ref 70–99)
Glucose-Capillary: 146 mg/dL — ABNORMAL HIGH (ref 70–99)
Glucose-Capillary: 180 mg/dL — ABNORMAL HIGH (ref 70–99)
Glucose-Capillary: 198 mg/dL — ABNORMAL HIGH (ref 70–99)
Glucose-Capillary: 198 mg/dL — ABNORMAL HIGH (ref 70–99)

## 2022-06-15 LAB — TYPE AND SCREEN
ABO/RH(D): B POS
Unit division: 0

## 2022-06-15 LAB — ECHO INTRAOPERATIVE TEE
AR max vel: 0.56 cm2
AV Area VTI: 0.57 cm2
AV Area mean vel: 0.56 cm2
AV Mean grad: 33 mmHg
AV Peak grad: 50.4 mmHg
Ao pk vel: 3.55 m/s
Height: 62 in
P 1/2 time: 691 msec
S' Lateral: 2.9 cm

## 2022-06-15 LAB — CBC
HCT: 25.7 % — ABNORMAL LOW (ref 36.0–46.0)
HCT: 24.5 % — ABNORMAL LOW (ref 36.0–46.0)
Hemoglobin: 8.2 g/dL — ABNORMAL LOW (ref 12.0–15.0)
Hemoglobin: 8.2 g/dL — ABNORMAL LOW (ref 12.0–15.0)
MCH: 27.2 pg (ref 26.0–34.0)
MCH: 28.2 pg (ref 26.0–34.0)
MCHC: 31.9 g/dL (ref 30.0–36.0)
MCHC: 33.5 g/dL (ref 30.0–36.0)
MCV: 85.1 fL (ref 80.0–100.0)
MCV: 84.2 fL (ref 80.0–100.0)
Platelets: 119 10*3/uL — ABNORMAL LOW (ref 150–400)
Platelets: 97 10*3/uL — ABNORMAL LOW (ref 150–400)
RBC: 3.02 MIL/uL — ABNORMAL LOW (ref 3.87–5.11)
RBC: 2.91 MIL/uL — ABNORMAL LOW (ref 3.87–5.11)
RDW: 13.2 % (ref 11.5–15.5)
RDW: 13.2 % (ref 11.5–15.5)
WBC: 17.8 10*3/uL — ABNORMAL HIGH (ref 4.0–10.5)
WBC: 18 10*3/uL — ABNORMAL HIGH (ref 4.0–10.5)
nRBC: 0 % (ref 0.0–0.2)
nRBC: 0.1 % (ref 0.0–0.2)

## 2022-06-15 LAB — BPAM RBC
Blood Product Expiration Date: 202405022359
ISSUE DATE / TIME: 202404230937
Unit Type and Rh: 7300

## 2022-06-15 LAB — BASIC METABOLIC PANEL
Anion gap: 8 (ref 5–15)
Anion gap: 9 (ref 5–15)
BUN: 46 mg/dL — ABNORMAL HIGH (ref 8–23)
BUN: 49 mg/dL — ABNORMAL HIGH (ref 8–23)
CO2: 22 mmol/L (ref 22–32)
CO2: 23 mmol/L (ref 22–32)
Calcium: 7.8 mg/dL — ABNORMAL LOW (ref 8.9–10.3)
Calcium: 8 mg/dL — ABNORMAL LOW (ref 8.9–10.3)
Chloride: 100 mmol/L (ref 98–111)
Chloride: 98 mmol/L (ref 98–111)
Creatinine, Ser: 1.86 mg/dL — ABNORMAL HIGH (ref 0.44–1.00)
Creatinine, Ser: 2.32 mg/dL — ABNORMAL HIGH (ref 0.44–1.00)
GFR, Estimated: 30 mL/min — ABNORMAL LOW (ref >60)
GFR, Estimated: 23 mL/min — ABNORMAL LOW (ref >60)
Glucose, Bld: 121 mg/dL — ABNORMAL HIGH (ref 70–99)
Glucose, Bld: 145 mg/dL — ABNORMAL HIGH (ref 70–99)
Potassium: 5.9 mmol/L — ABNORMAL HIGH (ref 3.5–5.1)
Potassium: 5.2 mmol/L — ABNORMAL HIGH (ref 3.5–5.1)
Sodium: 130 mmol/L — ABNORMAL LOW (ref 135–145)
Sodium: 130 mmol/L — ABNORMAL LOW (ref 135–145)

## 2022-06-15 LAB — MAGNESIUM
Magnesium: 3.6 mg/dL — ABNORMAL HIGH (ref 1.7–2.4)
Magnesium: 3.3 mg/dL — ABNORMAL HIGH (ref 1.7–2.4)

## 2022-06-15 MED ORDER — FUROSEMIDE 10 MG/ML IJ SOLN
80.0000 mg | Freq: Once | INTRAMUSCULAR | Status: AC
  Administered 2022-06-15: 80 mg via INTRAVENOUS
  Filled 2022-06-15: qty 8

## 2022-06-15 MED ORDER — ENOXAPARIN SODIUM 30 MG/0.3ML IJ SOSY
30.0000 mg | PREFILLED_SYRINGE | Freq: Every day | INTRAMUSCULAR | Status: DC
  Administered 2022-06-15: 30 mg via SUBCUTANEOUS
  Filled 2022-06-15: qty 0.3

## 2022-06-15 MED ORDER — FUROSEMIDE 10 MG/ML IJ SOLN
40.0000 mg | Freq: Once | INTRAMUSCULAR | Status: AC
  Administered 2022-06-15: 40 mg via INTRAVENOUS
  Filled 2022-06-15: qty 4

## 2022-06-15 MED ORDER — INSULIN DETEMIR 100 UNIT/ML ~~LOC~~ SOLN
8.0000 [IU] | Freq: Two times a day (BID) | SUBCUTANEOUS | Status: DC
  Administered 2022-06-15 – 2022-06-19 (×8): 8 [IU] via SUBCUTANEOUS
  Filled 2022-06-15 (×10): qty 0.08

## 2022-06-15 MED ORDER — INSULIN ASPART 100 UNIT/ML IJ SOLN: 3.0000 [IU] | INTRAMUSCULAR | Status: DC

## 2022-06-15 MED ORDER — INSULIN ASPART 100 UNIT/ML IJ SOLN
0.0000 [IU] | INTRAMUSCULAR | Status: DC
  Administered 2022-06-15 (×3): 4 [IU] via SUBCUTANEOUS
  Administered 2022-06-16: 2 [IU] via SUBCUTANEOUS
  Administered 2022-06-16 (×2): 4 [IU] via SUBCUTANEOUS
  Administered 2022-06-16: 2 [IU] via SUBCUTANEOUS
  Administered 2022-06-16: 4 [IU] via SUBCUTANEOUS
  Administered 2022-06-17: 2 [IU] via SUBCUTANEOUS
  Administered 2022-06-17: 4 [IU] via SUBCUTANEOUS
  Administered 2022-06-17: 2 [IU] via SUBCUTANEOUS
  Administered 2022-06-17 (×4): 4 [IU] via SUBCUTANEOUS
  Administered 2022-06-18: 8 [IU] via SUBCUTANEOUS
  Administered 2022-06-18: 4 [IU] via SUBCUTANEOUS
  Administered 2022-06-18 (×2): 2 [IU] via SUBCUTANEOUS
  Administered 2022-06-18: 12 [IU] via SUBCUTANEOUS
  Administered 2022-06-19 (×2): 8 [IU] via SUBCUTANEOUS
  Administered 2022-06-19: 4 [IU] via SUBCUTANEOUS
  Administered 2022-06-19: 8 [IU] via SUBCUTANEOUS
  Administered 2022-06-19: 16 [IU] via SUBCUTANEOUS
  Administered 2022-06-20: 2 [IU] via SUBCUTANEOUS
  Administered 2022-06-20 (×2): 4 [IU] via SUBCUTANEOUS
  Administered 2022-06-20: 2 [IU] via SUBCUTANEOUS
  Administered 2022-06-20: 4 [IU] via SUBCUTANEOUS

## 2022-06-15 NOTE — Progress Notes (Signed)
[address]       Chelsea English [address]             [phone]      1 Day Post-Op  Procedure(s) (LRB): AORTIC VALVE REPLACEMENT (AVR) USING SJM REGENT MECHANICAL HEART VALVE SIZE (N/A) TRANSESOPHAGEAL ECHOCARDIOGRAM (N/A)  Total Length of Stay:  LOS: 1 day   SUBJECTIVE: Doing well. No complaints   Vitals:   06/15/22 0615 06/15/22 0630  BP:    Pulse: 80 80  Resp: 18 18  Temp: 98.6 F (37 C) 98.8 F (37.1 C)  SpO2: 93% 92%    Intake/Output      04/23 0701 04/24 0700 04/24 0701 04/25 0700   I.V. (mL/kg) 2998.5 (30.9)    Blood 735    IV Piggyback 1945.3    Total Intake(mL/kg) 5678.8 (58.6)    Urine (mL/kg/hr) 2620 (1.1)    Blood 880    Chest Tube 450    Total Output 3950    Net +1728.8             sodium chloride 10 mL/hr at 06/15/22 0600   sodium chloride     sodium chloride 20 mL/hr at 06/15/22 0600   dexmedetomidine (PRECEDEX) IV infusion Stopped (06/14/22 1708)   insulin 2.4 Units/hr (06/15/22 0600)   lactated ringers     lactated ringers     lactated ringers 20 mL/hr at 06/15/22 0600   levofloxacin (LEVAQUIN) IV     phenylephrine (NEO-SYNEPHRINE) Adult infusion Stopped (06/14/22 1856)    CBC    Component Value Date/Time   WBC 17.8 (H) 06/15/2022 0405   RBC 3.02 (L) 06/15/2022 0405   HGB 8.2 (L) 06/15/2022 0405   HCT 25.7 (L) 06/15/2022 0405   PLT 119 (L) 06/15/2022 0405   MCV 85.1 06/15/2022 0405   MCH 27.2 06/15/2022 0405   MCHC 31.9 06/15/2022 0405   RDW 13.2 06/15/2022 0405   LYMPHSABS 2.0 04/16/2022 0056   MONOABS 0.6 04/16/2022 0056   EOSABS 0.1 04/16/2022 0056   BASOSABS 0.0 04/16/2022 0056   CMP     Component Value Date/Time   NA 130 (L) 06/15/2022 0405   K 5.9 (H) 06/15/2022 0405   CL 100 06/15/2022 0405   CO2 22 06/15/2022 0405   GLUCOSE 121 (H) 06/15/2022 0405   BUN 46 (H) 06/15/2022 0405   CREATININE 1.86 (H) 06/15/2022 0405   CALCIUM 7.8 (L) 06/15/2022 0405   PROT 7.6 06/10/2022 1355    ALBUMIN 3.5 06/10/2022 1355   AST 32 06/10/2022 1355   ALT 38 06/10/2022 1355   ALKPHOS 162 (H) 06/10/2022 1355   BILITOT 0.4 06/10/2022 1355   GFRNONAA 30 (L) 06/15/2022 0405   ABG    Component Value Date/Time   PHART 7.299 (L) 06/14/2022 2116   PCO2ART 47.8 06/14/2022 2116   PO2ART 110 (H) 06/14/2022 2116   HCO3 23.5 06/14/2022 2116   TCO2 25 06/14/2022 2116   ACIDBASEDEF 3.0 (H) 06/14/2022 2116   O2SAT 98 06/14/2022 2116   CBG (last 3)  Recent Labs    06/15/22 0200 06/15/22 0405 06/15/22 0557  GLUCAP 134* 133* 146*  EXAM Lungs: clear Card:RR with sharp valve sound Ext: warm Neuro: awake and alert   ASSESSMENT: POD #1 SP AVR mechanical Hemodynamics: Stable with slow junctional underneath. Will keep apacing today and hold bb and amio Heme: blood loss anemia, stable. Will start coumadin tomorrow Renal: elevated Cr from yesterday but has baseline renal insufficiency.  K elevated will give diuretic Pulmonary: CXR looks wet. Diuresis Leave in unit today   Eugenio Hoes, MD 06/15/2022

## 2022-06-15 NOTE — Consult Note (Addendum)
NAME:  Chelsea English, MRN:  [id number], DOB:  30-Mar-1960, LOS: 1 ADMISSION DATE:  06/14/2022, CONSULTATION DATE:  4/23 REFERRING MD:  Dr. Leafy Ro CHIEF COMPLAINT: Severe aortic stenosis  History of Present Illness:   Ms. Chelsea English is a 62 y/o person livning with a history of severe aortic stenosis, HTN, CKD stg 3b, T2DM who presented for elective aortic valve replacement by Dr. Leafy Ro, admitted to the ICU for continued care and ventilatory management    Pertinent  Medical History  Severe aortic stenosis HFpEF CKD3b T2DM HTN  Significant Hospital Events: Including procedures, antibiotic start and stop dates in addition to other pertinent events   4/23 mechanical aortic valve replacement, transferred to CICU 4/23 extubated  Interim History / Subjective:   S/p aortic valve repair yesterday, extubated without complications. Drains with minimal output. No bowel movement since before her surgery.   Objective   Blood pressure 133/70, pulse 80, temperature 98.8 F (37.1 C), resp. rate 18, height 5\' 2"  (1.575 m), weight 96.9 kg, SpO2 92 %. PAP: (32-59)/(13-29) 43/22 CO:  [2.8 L/min-4.3 L/min] 2.8 L/min CI:  [1.5 L/min/m2-2.2 L/min/m2] 1.5 L/min/m2  Vent Mode: PSV;CPAP FiO2 (%):  [40 %-50 %] 40 % Set Rate:  [4 bmp-16 bmp] 4 bmp Vt Set:  [400 mL] 400 mL PEEP:  [5 cmH20] 5 cmH20 Pressure Support:  [10 cmH20] 10 cmH20 Plateau Pressure:  [19 cmH20] 19 cmH20   Intake/Output Summary (Last 24 hours) at 06/15/2022 0640 Last data filed at 06/15/2022 0600 Gross per 24 hour  Intake 5678.82 ml  Output 3950 ml  Net 1728.82 ml    Filed Weights   06/14/22 0546 06/15/22 0500  Weight: 90.7 kg 96.9 kg   Examination: General: obese HENT: nasal cannula in place Lungs: clear ot auscultation bilaterally Cardiovascular: regular rate and rhythm. No murmurs gallops rubs. Sternotomy bandage, drain in place Neuro: alert and oriented x 3 GU: foley in place  Resolved Hospital Problem list    N/a  Assessment & Plan:   Severe aortic stenosis S/p mechanical aortic valve replacement 4/23 Post op day 1, no acute events overnight. Repeat EKG this morning. Leukocytosis likely reactive from her procedure, she has remained afebrile.  - management per Dr. Leafy Ro and team - anticoagulation per primary  - phenylephrine no longer needed - metoprolol BID  Postoperative vent management Extubated yesterday evening, transitioned to nasal cannula. Will work to wean.  -wean oxygen as able  CKDIIIb Hyperkalemia Hyponatremia Cr this am of 1.8, near baseline of 1.7. K elevated at 5.9. EKG already scheduled for this morning. Primary to give lasix and follow up afternoon BMP. Follow up EKG. Hyponatremia likely hypovolemic, if no improvement in coming day can order urine Na and osmol levels.   Normocytic anemia Chronic, likely secondary to renal failure. Would recommend repeat iron studies after admission. Hgb stable at 8.2  T2DM Last A1c of 9.9% -insulin ggt per primary, glucose levels stable  HTN Hold in setting of recent aortic valve repair  Constipation Continue stool softeners  Best Practice (right click and "Reselect all SmartList Selections" daily)   Diet/type: NPO DVT prophylaxis: per primary GI prophylaxis: famotidine  Foley:  Yes, and it is still needed Code Status:  full code  Labs   CBC: Recent Labs  Lab 06/10/22 1355 06/14/22 0811 06/14/22 1004 06/14/22 1039 06/14/22 1156 06/14/22 1721 06/14/22 1816 06/14/22 2116 06/15/22 0405  WBC 9.7  --   --   --  14.6*  --  16.7*  --  17.8*  HGB 9.7*   < > 6.4*   < > 8.6* 8.8* 8.3* 8.2* 8.2*  HCT 29.5*   < > 19.2*   < > 25.4* 26.0* 25.6* 24.0* 25.7*  MCV 83.1  --   --   --  82.2  --  84.8  --  85.1  PLT 221  --  164  --  140*  --  125*  --  119*   < > = values in this interval not displayed.     Basic Metabolic Panel: Recent Labs  Lab 06/10/22 1355 06/14/22 0811 06/14/22 0841 06/14/22 0900 06/14/22 0928  06/14/22 1001 06/14/22 1043 06/14/22 1152 06/14/22 1721 06/14/22 1816 06/14/22 2116 06/15/22 0405  NA 131*   < > 137   < > 136   < > 137 137 138 134* 138 130*  K 4.3   < > 4.2   < > 4.8   < > 4.9 4.7 5.7* 5.4* 5.4* 5.9*  CL 98   < > 100  --  100  --  101  --   --  103  --  100  CO2 21*  --   --   --   --   --   --   --   --  23  --  22  GLUCOSE 340*   < > 139*  --  125*  --  141*  --   --  128*  --  121*  BUN 63*   < > 48*  --  48*  --  44*  --   --  45*  --  46*  CREATININE 1.77*   < > 1.60*  --  1.50*  --  1.40*  --   --  1.56*  --  1.86*  CALCIUM 9.1  --   --   --   --   --   --   --   --  8.0*  --  7.8*  MG  --   --   --   --   --   --   --   --   --  4.0*  --  3.6*   < > = values in this interval not displayed.    GFR: Estimated Creatinine Clearance: 34.1 mL/min (A) (by C-G formula based on SCr of 1.86 mg/dL (H)). Recent Labs  Lab 06/10/22 1355 06/14/22 1156 06/14/22 1816 06/15/22 0405  WBC 9.7 14.6* 16.7* 17.8*     Liver Function Tests: Recent Labs  Lab 06/10/22 1355  AST 32  ALT 38  ALKPHOS 162*  BILITOT 0.4  PROT 7.6  ALBUMIN 3.5    No results for input(s): "LIPASE", "AMYLASE" in the last 168 hours. No results for input(s): "AMMONIA" in the last 168 hours.  ABG    Component Value Date/Time   PHART 7.299 (L) 06/14/2022 2116   PCO2ART 47.8 06/14/2022 2116   PO2ART 110 (H) 06/14/2022 2116   HCO3 23.5 06/14/2022 2116   TCO2 25 06/14/2022 2116   ACIDBASEDEF 3.0 (H) 06/14/2022 2116   O2SAT 98 06/14/2022 2116     Coagulation Profile: Recent Labs  Lab 06/10/22 1355 06/14/22 1156  INR 1.0 1.5*     Cardiac Enzymes: No results for input(s): "CKTOTAL", "CKMB", "CKMBINDEX", "TROPONINI" in the last 168 hours.  HbA1C: Hgb A1c MFr Bld  Date/Time Value Ref Range Status  06/10/2022 01:55 PM 9.9 (H) 4.8 - 5.6 % Final    Comment:    (NOTE) Pre diabetes:  5.7%-6.4%  Diabetes:              >6.4%  Glycemic control for   <7.0% adults with  diabetes   04/15/2022 10:07 PM 6.8 (H) 4.8 - 5.6 % Final    Comment:    (NOTE) Pre diabetes:          5.7%-6.4%  Diabetes:              >6.4%  Glycemic control for   <7.0% adults with diabetes     CBG: Recent Labs  Lab 06/15/22 0008 06/15/22 0114 06/15/22 0200 06/15/22 0405 06/15/22 0557  GLUCAP 138* 132* 134* 133* 146*     Review of Systems:     Past Medical History:  She,  has a past medical history of Anemia, Aortic stenosis, Coronary artery disease, Diabetes mellitus without complication, Heart murmur, Hyperlipidemia, Hypertension, and Oxygen dependent.   Surgical History:   Past Surgical History:  Procedure Laterality Date   AORTIC VALVE REPLACEMENT N/A 06/14/2022   Procedure: AORTIC VALVE REPLACEMENT (AVR) USING SJM REGENT MECHANICAL HEART VALVE SIZE ;  Surgeon: Eugenio Hoes, MD;  Location: [facility] OR;  Service: Open Heart Surgery;  Laterality: N/A;   ARTERY BIOPSY Right 07/20/2021   Procedure: BIOPSY OF RIGHT TEMPORAL ARTERY;  Surgeon: Chuck Hint, MD;  Location: [facility] OR;  Service: Vascular;  Laterality: Right;   cataract Right    HYSTEROSCOPY WITH D & C     removal of polyp   MULTIPLE TOOTH EXTRACTIONS     no teeth, no dentures   RIGHT AND LEFT HEART CATH N/A 04/19/2022   Procedure: RIGHT AND LEFT HEART CATH;  Surgeon: [name], MD;  Location: [facility] INVASIVE CV LAB;  Service: Cardiovascular;  Laterality: N/A;   TEE WITHOUT CARDIOVERSION N/A 06/14/2022   Procedure: TRANSESOPHAGEAL ECHOCARDIOGRAM;  Surgeon: Eugenio Hoes, MD;  Location: [facility] OR;  Service: Open Heart Surgery;  Laterality: N/A;     Social History:   reports that she has never smoked. She has never used smokeless tobacco. She reports that she does not drink alcohol and does not use drugs.   Family History:  Her family history is not on file.   Allergies Allergies  Allergen Reactions   Penicillins Anaphylaxis     Home Medications  Prior to Admission medications    Medication Sig Start Date End Date Taking? Authorizing Provider  amLODipine (NORVASC) 5 MG tablet Take 5 mg by mouth daily. 04/28/22  Yes [provider]  aspirin 81 MG chewable tablet Chew 81 mg by mouth daily. 07/06/18  Yes [provider]  atorvastatin (LIPITOR) 40 MG tablet Take 40 mg by mouth at bedtime. 06/01/21  Yes [provider]  BD INSULIN SYRINGE U/F 31G X 5/16" 1 ML MISC Inject 1 each into the skin 3 (three) times daily. Use to inject unsulin 02/07/22  Yes [provider]  benzonatate (TESSALON) 100 MG capsule Take 100 mg by mouth 2 (two) times daily as needed for cough. 07/12/21  Yes [provider]  carvedilol (COREG) 3.125 MG tablet Take 1 tablet (3.125 mg total) by mouth 2 (two) times daily with a meal. 05/19/22  Yes Orbie Pyo, MD  cyclobenzaprine (FLEXERIL) 5 MG tablet Take 10 mg by mouth 2 (two) times daily as needed for muscle spasms. 07/06/21  Yes [provider]  ferrous sulfate 325 (65 FE) MG tablet Take 650 mg by mouth daily with breakfast.   Yes [provider]  furosemide (LASIX) 40  MG tablet Take 40 mg by mouth daily. 05/18/22  Yes [provider]  gabapentin (NEURONTIN) 100 MG capsule Take 100 mg by mouth daily. 07/06/21  Yes [provider]  insulin NPH-regular Human (HUMULIN 70/30) (70-30) 100 UNIT/ML injection Inject 40-60 Units into the skin See admin instructions. Injecting 60 units in the morning and 40 units at bedtime. 10/20/17  Yes [provider]  JANUVIA 100 MG tablet Take 100 mg by mouth daily. 07/05/21  Yes [provider]  Multiple Vitamins-Minerals (MULTI FOR HER 50+ PO) Take 1 tablet by mouth daily.   Yes [provider]  polyethylene glycol (MIRALAX / GLYCOLAX) 17 g packet Take 17 g by mouth daily as needed for moderate constipation.   Yes [provider]    Thalia Bloodgood DO  Internal Medicine Resident PGY-3 [place]  Pager:  [phone]

## 2022-06-15 NOTE — Progress Notes (Signed)
EVENING ROUNDS NOTE :     [address]       [place] [address]             [phone]                 1 Day Post-Op Procedure(s) (LRB): AORTIC VALVE REPLACEMENT (AVR) USING SJM REGENT MECHANICAL HEART VALVE SIZE (N/A) TRANSESOPHAGEAL ECHOCARDIOGRAM (N/A)   Total Length of Stay:  LOS: 1 day  Events:   No events Stable day    BP 132/71   Pulse 80   Temp 98.5 F (36.9 C) (Oral)   Resp 16   Ht  (1.575 m)   Wt 96.9 kg   SpO2 94%   BMI 39.07 kg/m   PAP: (34-51)/(15-29) 49/26 CO:  [2.8 L/min-3.8 L/min] 2.8 L/min CI:  [1.5 L/min/m2-2 L/min/m2] 1.5 L/min/m2  Vent Mode: PSV;CPAP FiO2 (%):  [40 %] 40 % Set Rate:  [4 bmp] 4 bmp PEEP:  [5 cmH20] 5 cmH20 Pressure Support:  [10 cmH20] 10 cmH20   sodium chloride Stopped (06/15/22 0952)   sodium chloride     sodium chloride 20 mL/hr at 06/15/22 1700   dexmedetomidine (PRECEDEX) IV infusion Stopped (06/14/22 1708)   insulin Stopped (06/15/22 1256)   lactated ringers     lactated ringers     lactated ringers Stopped (06/15/22 1301)   phenylephrine (NEO-SYNEPHRINE) Adult infusion Stopped (06/14/22 1856)    I/O last 3 completed shifts: In: 5711.2 [I.V.:3030.9; Blood:735; IV Piggyback:1945.3] Out: 3950 [Urine:2620; Blood:880; Chest Tube:450]      Latest Ref Rng & Units 06/15/2022    4:05 AM 06/14/2022    9:16 PM 06/14/2022    6:16 PM  CBC  WBC 4.0 - 10.5 K/uL 17.8   16.7   Hemoglobin 12.0 - 15.0 g/dL 8.2  8.2  8.3   Hematocrit 36.0 - 46.0 % 25.7  24.0  25.6   Platelets 150 - 400 K/uL 119   125        Latest Ref Rng & Units 06/15/2022    1:35 PM 06/15/2022    4:05 AM 06/14/2022    9:16 PM  BMP  Glucose 70 - 99 mg/dL 914  782    BUN 8 - 23 mg/dL 49  46    Creatinine 9.56 - 1.00 mg/dL 2.13  0.86    Sodium 578 - 145 mmol/L 130  130  138   Potassium 3.5 - 5.1 mmol/L 5.2  5.9  5.4   Chloride 98 - 111 mmol/L 98  100    CO2 22 - 32 mmol/L 23  22    Calcium 8.9 - 10.3 mg/dL 8.0  7.8       ABG    Component Value Date/Time   PHART 7.299 (L) 06/14/2022 2116   PCO2ART 47.8 06/14/2022 2116   PO2ART 110 (H) 06/14/2022 2116   HCO3 23.5 06/14/2022 2116   TCO2 25 06/14/2022 2116   ACIDBASEDEF 3.0 (H) 06/14/2022 2116   O2SAT 98 06/14/2022 2116       Brynda Greathouse, MD 06/15/2022 5:42 PM

## 2022-06-15 NOTE — Progress Notes (Signed)
No BIPAP needed tonight. Per. RN Dr. Laneta Simmers does not want pt on BIPAP tonight. Pt currently on 4L  RR 16 sats 97.

## 2022-06-15 NOTE — TOC Initial Note (Signed)
Transition of Care [facility]) - Initial/Assessment Note    Patient Details  Name: Chelsea English MRN: [id number] Date of Birth: 1960/10/26  Transition of Care [facility]) CM/SW Contact:    Gala Lewandowsky, RN Phone Number: 06/15/2022, 11:39 AM  Clinical Narrative: Patient was discussed in progression rounds. Patient POD-1 AVR. PTA patient was from home with children. Daughter at bedside answering the questions. Patient has DME oxygen 2 liters via [employer] qhs, rollator, bedside commode, and rolling walker. Case Manager will continue to follow for transition of care needs as the patient progresses.                 Expected Discharge Plan: Home w Home Health Services Barriers to Discharge: Continued Medical Work up   Patient Goals and CMS Choice Patient states their goals for this hospitalization and ongoing recovery are:: plan to return home with children   Expected Discharge Plan and Services   Discharge Planning Services: CM Consult Post Acute Care Choice: Home Health Living arrangements for the past 2 months: Single Family Home                   DME Agency: NA     Prior Living Arrangements/Services Living arrangements for the past 2 months: Single Family Home Lives with:: Adult Children Patient language and need for interpreter reviewed:: Yes Do you feel safe going back to the place where you live?: Yes      Need for Family Participation in Patient Care: Yes (Comment) Care giver support system in place?: Yes (comment) Current home services: DME (oxygen via [employer] 2 liters qhs, bsc, and rolling walker.) Criminal Activity/Legal Involvement Pertinent to Current Situation/Hospitalization: No - Comment as needed  Permission Sought/Granted Permission sought to share information with : Family Supports, Case Manager   Emotional Assessment Appearance:: Appears stated age       Alcohol / Substance Use: Not Applicable Psych Involvement: No (comment)  Admission diagnosis:  S/P  AVR (aortic valve replacement) [Z95.2] Patient Active Problem List   Diagnosis Date Noted   S/P AVR (aortic valve replacement) 06/14/2022   Type 2 diabetes mellitus with hyperlipidemia 04/16/2022   CHF (congestive heart failure) 04/15/2022   Pulmonary edema 04/15/2022   Acute on chronic diastolic CHF (congestive heart failure) 04/15/2022   Acute hypoxic respiratory failure 04/15/2022   SIRS (systemic inflammatory response syndrome) 04/15/2022   Lactic acidosis 04/15/2022   Transaminitis 04/15/2022   Elevated troponin 04/15/2022   Nodule of upper lobe of right lung 04/15/2022   History of anemia due to chronic kidney disease 04/15/2022   Stage 3b chronic kidney disease (CKD) 04/15/2022   Leukocytosis 04/15/2022   Diabetic retinopathy 07/21/2021   Cataract, left eye 07/21/2021   Hypertensive retinopathy 07/21/2021   AKI (acute kidney injury) 07/21/2021   HLD (hyperlipidemia) 07/21/2021   Chronic pain 07/21/2021   Systolic murmur 07/21/2021   AV block, 1st degree 07/21/2021   Class 2 obesity 07/21/2021   Vision changes - concern for right side temporal arteritis 07/17/2021   GERD (gastroesophageal reflux disease) 08/09/2019   CKD (chronic kidney disease) stage 2, GFR 60-89 ml/min 08/09/2019   Essential hypertension 11/20/2017   DM2 (diabetes mellitus, type 2) 11/20/2017   PCP:  Freddrick March, PA-C Pharmacy:   [facility] [address] [place], [place] - [address] [place] [address] Phone: [phone] Fax: [phone]  Social Determinants of Health (SDOH) Social History: SDOH Screenings   Food Insecurity: No Food Insecurity (04/15/2022)  Housing: Low Risk  (04/15/2022)  Transportation Needs: No Transportation Needs (04/15/2022)  Utilities: Not At Risk (04/15/2022)  Tobacco Use: Low Risk  (06/15/2022)   Readmission Risk Interventions     No data to display

## 2022-06-16 ENCOUNTER — Inpatient Hospital Stay (HOSPITAL_COMMUNITY): Payer: Medicaid Other

## 2022-06-16 DIAGNOSIS — I441 Atrioventricular block, second degree: Secondary | ICD-10-CM

## 2022-06-16 DIAGNOSIS — Z952 Presence of prosthetic heart valve: Secondary | ICD-10-CM

## 2022-06-16 LAB — CBC
HCT: 23.6 % — ABNORMAL LOW (ref 36.0–46.0)
HCT: 22 % — ABNORMAL LOW (ref 36.0–46.0)
Hemoglobin: 7.9 g/dL — ABNORMAL LOW (ref 12.0–15.0)
Hemoglobin: 7.4 g/dL — ABNORMAL LOW (ref 12.0–15.0)
MCH: 27.7 pg (ref 26.0–34.0)
MCH: 28 pg (ref 26.0–34.0)
MCHC: 33.5 g/dL (ref 30.0–36.0)
MCHC: 33.6 g/dL (ref 30.0–36.0)
MCV: 82.8 fL (ref 80.0–100.0)
MCV: 83.3 fL (ref 80.0–100.0)
Platelets: 85 10*3/uL — ABNORMAL LOW (ref 150–400)
Platelets: 73 10*3/uL — ABNORMAL LOW (ref 150–400)
RBC: 2.85 MIL/uL — ABNORMAL LOW (ref 3.87–5.11)
RBC: 2.64 MIL/uL — ABNORMAL LOW (ref 3.87–5.11)
RDW: 13.2 % (ref 11.5–15.5)
RDW: 13.2 % (ref 11.5–15.5)
WBC: 15.8 10*3/uL — ABNORMAL HIGH (ref 4.0–10.5)
WBC: 15.9 10*3/uL — ABNORMAL HIGH (ref 4.0–10.5)
nRBC: 0.1 % (ref 0.0–0.2)
nRBC: 0.2 % (ref 0.0–0.2)

## 2022-06-16 LAB — URINALYSIS, COMPLETE (UACMP) WITH MICROSCOPIC
Bilirubin Urine: NEGATIVE
Glucose, UA: NEGATIVE mg/dL
Ketones, ur: NEGATIVE mg/dL
Nitrite: NEGATIVE
Protein, ur: 100 mg/dL — AB
Specific Gravity, Urine: 1.016 (ref 1.005–1.030)
pH: 5 (ref 5.0–8.0)

## 2022-06-16 LAB — ECHOCARDIOGRAM COMPLETE
AR max vel: 1.24 cm2
AV Area VTI: 1.05 cm2
AV Area mean vel: 1.24 cm2
AV Mean grad: 15 mmHg
AV Peak grad: 25.2 mmHg
Ao pk vel: 2.51 m/s
Area-P 1/2: 3.68 cm2
Calc EF: 53 %
Height: 62 in
Single Plane A2C EF: 58.8 %
Single Plane A4C EF: 45.8 %
Weight: 3266.34 oz

## 2022-06-16 LAB — GLUCOSE, CAPILLARY
Glucose-Capillary: 151 mg/dL — ABNORMAL HIGH (ref 70–99)
Glucose-Capillary: 197 mg/dL — ABNORMAL HIGH (ref 70–99)
Glucose-Capillary: 182 mg/dL — ABNORMAL HIGH (ref 70–99)
Glucose-Capillary: 186 mg/dL — ABNORMAL HIGH (ref 70–99)
Glucose-Capillary: 156 mg/dL — ABNORMAL HIGH (ref 70–99)

## 2022-06-16 LAB — BASIC METABOLIC PANEL
Anion gap: 11 (ref 5–15)
Anion gap: 13 (ref 5–15)
BUN: 61 mg/dL — ABNORMAL HIGH (ref 8–23)
BUN: 71 mg/dL — ABNORMAL HIGH (ref 8–23)
CO2: 21 mmol/L — ABNORMAL LOW (ref 22–32)
CO2: 21 mmol/L — ABNORMAL LOW (ref 22–32)
Calcium: 8 mg/dL — ABNORMAL LOW (ref 8.9–10.3)
Calcium: 8.2 mg/dL — ABNORMAL LOW (ref 8.9–10.3)
Chloride: 94 mmol/L — ABNORMAL LOW (ref 98–111)
Chloride: 93 mmol/L — ABNORMAL LOW (ref 98–111)
Creatinine, Ser: 2.8 mg/dL — ABNORMAL HIGH (ref 0.44–1.00)
Creatinine, Ser: 3.07 mg/dL — ABNORMAL HIGH (ref 0.44–1.00)
GFR, Estimated: 19 mL/min — ABNORMAL LOW (ref >60)
GFR, Estimated: 17 mL/min — ABNORMAL LOW (ref >60)
Glucose, Bld: 211 mg/dL — ABNORMAL HIGH (ref 70–99)
Glucose, Bld: 176 mg/dL — ABNORMAL HIGH (ref 70–99)
Potassium: 5.9 mmol/L — ABNORMAL HIGH (ref 3.5–5.1)
Potassium: 5.2 mmol/L — ABNORMAL HIGH (ref 3.5–5.1)
Sodium: 126 mmol/L — ABNORMAL LOW (ref 135–145)
Sodium: 127 mmol/L — ABNORMAL LOW (ref 135–145)

## 2022-06-16 LAB — CORTISOL: Cortisol, Plasma: 21.9 ug/dL

## 2022-06-16 LAB — NA AND K (SODIUM & POTASSIUM), RAND UR
Potassium Urine: 59 mmol/L
Sodium, Ur: <10 mmol/L

## 2022-06-16 LAB — HEPARIN LEVEL (UNFRACTIONATED): Heparin Unfractionated: 0.31 IU/mL (ref 0.30–0.70)

## 2022-06-16 LAB — CREATININE, URINE, RANDOM: Creatinine, Urine: 92 mg/dL

## 2022-06-16 LAB — OSMOLALITY, URINE: Osmolality, Ur: 365 mOsm/kg (ref 300–900)

## 2022-06-16 MED ORDER — FUROSEMIDE 10 MG/ML IJ SOLN
40.0000 mg | Freq: Once | INTRAMUSCULAR | Status: AC
  Administered 2022-06-16: 40 mg via INTRAVENOUS
  Filled 2022-06-16: qty 4

## 2022-06-16 MED ORDER — HEPARIN (PORCINE) 25000 UT/250ML-% IV SOLN
700.0000 [IU]/h | INTRAVENOUS | Status: DC
  Administered 2022-06-16: 700 [IU]/h via INTRAVENOUS
  Filled 2022-06-16: qty 250

## 2022-06-16 MED ORDER — ORAL CARE MOUTH RINSE: 15.0000 mL | OROMUCOSAL | Status: DC | PRN

## 2022-06-16 MED ORDER — SODIUM ZIRCONIUM CYCLOSILICATE 10 G PO PACK
10.0000 g | PACK | Freq: Two times a day (BID) | ORAL | Status: AC
  Administered 2022-06-16 (×2): 10 g via ORAL
  Filled 2022-06-16 (×2): qty 1

## 2022-06-16 MED ORDER — FUROSEMIDE 10 MG/ML IJ SOLN
10.0000 mg/h | INTRAVENOUS | Status: DC
  Administered 2022-06-16 – 2022-06-17 (×2): 10 mg/h via INTRAVENOUS
  Filled 2022-06-16 (×2): qty 20

## 2022-06-16 MED ORDER — ASPIRIN 81 MG PO TBEC
81.0000 mg | DELAYED_RELEASE_TABLET | Freq: Every day | ORAL | Status: DC
  Administered 2022-06-16 – 2022-06-22 (×7): 81 mg via ORAL
  Filled 2022-06-16 (×7): qty 1

## 2022-06-16 MED ORDER — ORAL CARE MOUTH RINSE
15.0000 mL | OROMUCOSAL | Status: DC
  Administered 2022-06-16 – 2022-06-22 (×18): 15 mL via OROMUCOSAL

## 2022-06-16 MED ORDER — HEPARIN (PORCINE) 25000 UT/250ML-% IV SOLN: 8.0000 [IU]/kg/h | INTRAVENOUS | Status: DC

## 2022-06-16 NOTE — Inpatient Diabetes Management (Signed)
Inpatient Diabetes Program Recommendations  AACE/ADA: New Consensus Statement on Inpatient Glycemic Control (2015)  Target Ranges:  Prepandial:   less than 140 mg/dL      Peak postprandial:   less than 180 mg/dL (1-2 hours)      Critically ill patients:  140 - 180 mg/dL   Lab Results  Component Value Date   GLUCAP 197 (H) 06/16/2022   HGBA1C 9.9 (H) 06/10/2022    Review of Glycemic Control  Latest Reference Range & Units 06/15/22 23:44 06/16/22 03:26 06/16/22 07:49  Glucose-Capillary 70 - 99 mg/dL 161 (H) 096 (H) 045 (H)  (H): Data is abnormally high Diabetes history: Type 2 Dm Outpatient Diabetes medications: Humulin 70/30 40 units QHS, 60 units QD, Januvia 100 mg QD Current orders for Inpatient glycemic control: Levemir 8 units BID, Novolog 0-24 units Q4H  Inpatient Diabetes Program Recommendations:    Consider increasing Levemir to 12 units BID.   Thanks, Lujean Rave, MSN, RNC-OB Diabetes Coordinator [phone] (8a-5p)

## 2022-06-16 NOTE — Consult Note (Addendum)
NAME:  Chelsea English, MRN:  [id number], DOB:  30-Apr-1960, LOS: 2 ADMISSION DATE:  06/14/2022, CONSULTATION DATE:  4/23 REFERRING MD:  Dr. Leafy Ro CHIEF COMPLAINT: Severe aortic stenosis  History of Present Illness:   Ms. Chelsea English is a 62 y/o person livning with a history of severe aortic stenosis, HTN, CKD stg 3b, T2DM who presented for elective aortic valve replacement by Dr. Leafy Ro, admitted to the ICU for continued care and ventilatory management    Pertinent  Medical History  Severe aortic stenosis HFpEF CKD3b T2DM HTN  Significant Hospital Events: Including procedures, antibiotic start and stop dates in addition to other pertinent events   4/23 mechanical aortic valve replacement, transferred to CICU 4/23 extubated  Interim History / Subjective:   Dyspneic overnight, pain well controlled. Feels as though swelling is worse. Daughter at bedside to interpret. Minimal urine output.   Objective   Blood pressure 97/82, pulse 80, temperature 98.3 F (36.8 C), temperature source Oral, resp. rate (!) 24, height 5\' 2"  (1.575 m), weight 92.6 kg, SpO2 90 %. PAP: (39-49)/(18-26) 49/26      Intake/Output Summary (Last 24 hours) at 06/16/2022 0620 Last data filed at 06/16/2022 0600 Gross per 24 hour  Intake 341.36 ml  Output 1370 ml  Net -1028.64 ml    Filed Weights   06/14/22 0546 06/15/22 0500 06/16/22 0500  Weight: 90.7 kg 96.9 kg 92.6 kg   Examination: General: obese HENT: nasal cannula in place Lungs: decreased breath sounds at bases. Normal work of breathing on nasal cannula Cardiovascular: regular rate and rhythm. No murmurs gallops rubs. Sternotomy bandage, drain in place Skin: Anasarca Neuro: alert and oriented x 3 GU: foley in place  Resolved Hospital Problem list   N/a  Assessment & Plan:   Severe aortic stenosis S/p mechanical aortic valve replacement 4/23 Post op day 2, no acute events overnight. Leukocytosis improving. Hgb stable.  - management per Dr.  Leafy Ro and team - anticoagulation per primary  - metoprolol BID PRN  Postoperative vent management Extubated yesterday evening, transitioned to nasal cannula. Will work to wean. Increased supplementation overnight likely in setting of hypervolemia -wean oxygen as able  AKI on CKDIIIb Hyperkalemia Hyponatremia, likely hypotonic Increase in Cr to 2.8 from 1.8. Lasix challenge yesterday with around -1L urine out. BUN up 49>61. Na 130>126, corrected to 128 with hyperglycemia. K elevated to 5.9 despite lasix dosing yesterday. Minimal urine out. Will order renal US to rule out obstruction, UA to check for ATN. Likely post-operative AKI -lokelma per primary. Consider dose of calcium for cardiac protection -UA and renal US pending -if persistent hyponatremia, order urine Na and osmo tomorrow  Normocytic anemia Thrombocytopenia Chronic secondary to renal failure, likely exacerbated by valvular repair. No significant bleeding. Would recommend repeat iron studies after admission. Hgb remains stable 8.2>7.9. Plt 97>85.  -trend daily  T2DM Last A1c of 9.9% -manage glucose with basal/bolus dosing  HTN Hold in setting of recent aortic valve repair  Constipation Continue stool softeners  Best Practice (right click and "Reselect all SmartList Selections" daily)   Diet/type: regular DVT prophylaxis: per primary GI prophylaxis: protonix Foley:  Yes, and it is still needed Code Status:  full code  Labs   CBC: Recent Labs  Lab 06/14/22 1156 06/14/22 1721 06/14/22 1816 06/14/22 2116 06/15/22 0405 06/15/22 1642 06/16/22 0325  WBC 14.6*  --  16.7*  --  17.8* 18.0* 15.8*  HGB 8.6*   < > 8.3* 8.2* 8.2* 8.2* 7.9*  HCT 25.4*   < >  25.6* 24.0* 25.7* 24.5* 23.6*  MCV 82.2  --  84.8  --  85.1 84.2 82.8  PLT 140*  --  125*  --  119* 97* 85*   < > = values in this interval not displayed.     Basic Metabolic Panel: Recent Labs  Lab 06/10/22 1355 06/14/22 0811 06/14/22 1043  06/14/22 1152 06/14/22 1816 06/14/22 2116 06/15/22 0405 06/15/22 1335 06/15/22 1642 06/16/22 0325  NA 131*   < > 137   < > 134* 138 130* 130*  --  126*  K 4.3   < > 4.9   < > 5.4* 5.4* 5.9* 5.2*  --  5.9*  CL 98   < > 101  --  103  --  100 98  --  94*  CO2 21*  --   --   --  23  --  22 23  --  21*  GLUCOSE 340*   < > 141*  --  128*  --  121* 145*  --  211*  BUN 63*   < > 44*  --  45*  --  46* 49*  --  61*  CREATININE 1.77*   < > 1.40*  --  1.56*  --  1.86* 2.32*  --  2.80*  CALCIUM 9.1  --   --   --  8.0*  --  7.8* 8.0*  --  8.0*  MG  --   --   --   --  4.0*  --  3.6*  --  3.3*  --    < > = values in this interval not displayed.    GFR: Estimated Creatinine Clearance: 22.1 mL/min (A) (by C-G formula based on SCr of 2.8 mg/dL (H)). Recent Labs  Lab 06/14/22 1816 06/15/22 0405 06/15/22 1642 06/16/22 0325  WBC 16.7* 17.8* 18.0* 15.8*     Liver Function Tests: Recent Labs  Lab 06/10/22 1355  AST 32  ALT 38  ALKPHOS 162*  BILITOT 0.4  PROT 7.6  ALBUMIN 3.5    No results for input(s): "LIPASE", "AMYLASE" in the last 168 hours. No results for input(s): "AMMONIA" in the last 168 hours.  ABG    Component Value Date/Time   PHART 7.299 (L) 06/14/2022 2116   PCO2ART 47.8 06/14/2022 2116   PO2ART 110 (H) 06/14/2022 2116   HCO3 23.5 06/14/2022 2116   TCO2 25 06/14/2022 2116   ACIDBASEDEF 3.0 (H) 06/14/2022 2116   O2SAT 98 06/14/2022 2116     Coagulation Profile: Recent Labs  Lab 06/10/22 1355 06/14/22 1156  INR 1.0 1.5*     Cardiac Enzymes: No results for input(s): "CKTOTAL", "CKMB", "CKMBINDEX", "TROPONINI" in the last 168 hours.  HbA1C: Hgb A1c MFr Bld  Date/Time Value Ref Range Status  06/10/2022 01:55 PM 9.9 (H) 4.8 - 5.6 % Final    Comment:    (NOTE) Pre diabetes:          5.7%-6.4%  Diabetes:              >6.4%  Glycemic control for   <7.0% adults with diabetes   04/15/2022 10:07 PM 6.8 (H) 4.8 - 5.6 % Final    Comment:    (NOTE) Pre  diabetes:          5.7%-6.4%  Diabetes:              >6.4%  Glycemic control for   <7.0% adults with diabetes     CBG: Recent Labs  Lab 06/15/22  1301 06/15/22 1549 06/15/22 1942 06/15/22 2344 06/16/22 0326  GLUCAP 119* 180* 198* 198* 151*     Review of Systems:   Increased shortness of breath, diffuse swelling  Past Medical History:  She,  has a past medical history of Anemia, Aortic stenosis, Coronary artery disease, Diabetes mellitus without complication, Heart murmur, Hyperlipidemia, Hypertension, and Oxygen dependent.   Surgical History:   Past Surgical History:  Procedure Laterality Date   AORTIC VALVE REPLACEMENT N/A 06/14/2022   Procedure: AORTIC VALVE REPLACEMENT (AVR) USING SJM REGENT MECHANICAL HEART VALVE SIZE ;  Surgeon: Eugenio Hoes, MD;  Location: [facility] OR;  Service: Open Heart Surgery;  Laterality: N/A;   ARTERY BIOPSY Right 07/20/2021   Procedure: BIOPSY OF RIGHT TEMPORAL ARTERY;  Surgeon: Chuck Hint, MD;  Location: [facility] OR;  Service: Vascular;  Laterality: Right;   cataract Right    HYSTEROSCOPY WITH D & C     removal of polyp   MULTIPLE TOOTH EXTRACTIONS     no teeth, no dentures   RIGHT AND LEFT HEART CATH N/A 04/19/2022   Procedure: RIGHT AND LEFT HEART CATH;  Surgeon: [name], MD;  Location: [facility] INVASIVE CV LAB;  Service: Cardiovascular;  Laterality: N/A;   TEE WITHOUT CARDIOVERSION N/A 06/14/2022   Procedure: TRANSESOPHAGEAL ECHOCARDIOGRAM;  Surgeon: Eugenio Hoes, MD;  Location: [facility] OR;  Service: Open Heart Surgery;  Laterality: N/A;     Social History:   reports that she has never smoked. She has never used smokeless tobacco. She reports that she does not drink alcohol and does not use drugs.   Family History:  Her family history is not on file.   Allergies Allergies  Allergen Reactions   Penicillins Anaphylaxis     Home Medications  Prior to Admission medications   Medication Sig Start Date End Date Taking?  Authorizing Provider  amLODipine (NORVASC) 5 MG tablet Take 5 mg by mouth daily. 04/28/22  Yes [provider]  aspirin 81 MG chewable tablet Chew 81 mg by mouth daily. 07/06/18  Yes [provider]  atorvastatin (LIPITOR) 40 MG tablet Take 40 mg by mouth at bedtime. 06/01/21  Yes [provider]  BD INSULIN SYRINGE U/F 31G X 5/16" 1 ML MISC Inject 1 each into the skin 3 (three) times daily. Use to inject unsulin 02/07/22  Yes [provider]  benzonatate (TESSALON) 100 MG capsule Take 100 mg by mouth 2 (two) times daily as needed for cough. 07/12/21  Yes [provider]  carvedilol (COREG) 3.125 MG tablet Take 1 tablet (3.125 mg total) by mouth 2 (two) times daily with a meal. 05/19/22  Yes Orbie Pyo, MD  cyclobenzaprine (FLEXERIL) 5 MG tablet Take 10 mg by mouth 2 (two) times daily as needed for muscle spasms. 07/06/21  Yes [provider]  ferrous sulfate 325 (65 FE) MG tablet Take 650 mg by mouth daily with breakfast.   Yes [provider]  furosemide (LASIX) 40 MG tablet Take 40 mg by mouth daily. 05/18/22  Yes [provider]  gabapentin (NEURONTIN) 100 MG capsule Take 100 mg by mouth daily. 07/06/21  Yes [provider]  insulin NPH-regular Human (HUMULIN 70/30) (70-30) 100 UNIT/ML injection Inject 40-60 Units into the skin See admin instructions. Injecting 60 units in the morning and 40 units at bedtime. 10/20/17  Yes [provider]  JANUVIA 100 MG tablet Take 100 mg by mouth daily. 07/05/21  Yes [provider]  Multiple Vitamins-Minerals (MULTI FOR HER  50+ PO) Take 1 tablet by mouth daily.   Yes [provider]  polyethylene glycol (MIRALAX / GLYCOLAX) 17 g packet Take 17 g by mouth daily as needed for moderate constipation.   Yes [provider]    Thalia Bloodgood DO  Internal Medicine Resident PGY-3 [place]  Pager: [phone]

## 2022-06-16 NOTE — Progress Notes (Signed)
      [address]       [place] [address]             [phone]      POD # 2 AVR  Up in chair BP 113/65   Pulse 80   Temp 97.9 F (36.6 C) (Oral)   Resp 10   Ht  (1.575 m)   Wt 92.6 kg   SpO2 95%   BMI 37.34 kg/m    Intake/Output Summary (Last 24 hours) at 06/16/2022 1737 Last data filed at 06/16/2022 1600 Gross per 24 hour  Intake 56.21 ml  Output 1185 ml  Net -1128.79 ml  450 ml of UO today On Lasix drip  K= 5.2, creatinine 3.07 up from 2.8  Continue current Rx  Tanecia Mccay C. Dorris Fetch, MD [facility] [phone]

## 2022-06-16 NOTE — Progress Notes (Signed)
ANTICOAGULATION CONSULT NOTE  Pharmacy Consult for IV heparin Indication: AVR - start of Coumadin delayed d/t clinical status  Allergies  Allergen Reactions   Penicillins Anaphylaxis    Patient Measurements: Height:  (157.5 cm) Weight: 92.6 kg (204 lb 2.3 oz) IBW/kg (Calculated) : 50.1 Heparin Dosing Weight: 71.1 kg  Vital Signs: Temp: 97.9 F (36.6 C) (04/25 1623) Temp Source: Oral (04/25 1623) BP: 113/65 (04/25 1600)  Labs: Recent Labs    06/14/22 1156 06/14/22 1721 06/15/22 0405 06/15/22 1335 06/15/22 1642 06/16/22 0325 06/16/22 1253 06/16/22 1639  HGB 8.6*   < > 8.2*  --  8.2* 7.9* 7.4*  --   HCT 25.4*   < > 25.7*  --  24.5* 23.6* 22.0*  --   PLT 140*   < > 119*  --  97* 85* 73*  --   APTT 42*  --   --   --   --   --   --   --   LABPROT 18.0*  --   --   --   --   --   --   --   INR 1.5*  --   --   --   --   --   --   --   HEPARINUNFRC  --   --   --   --   --   --   --  0.31  CREATININE  --    < > 1.86* 2.32*  --  2.80*  --   --    < > = values in this interval not displayed.     Estimated Creatinine Clearance: 22.1 mL/min (A) (by C-G formula based on SCr of 2.8 mg/dL (H)).   Assessment: Per discussion with Dr. Leafy Ro, will start heparin drip at low rate until able to initiate Coumadin.    Heparin level 0.31 (at goal) on infusion at 700 units/hr. No bleeding noted.  Goal of Therapy:  Will aim to keep heparin level low ~ 0.2-0.3 Monitor platelets by anticoagulation protocol: Yes   Plan:  Continue IV heparin gtt at 700 units/hr Daily heparin level and CBC  Christoper Fabian, PharmD, BCPS Please see amion for complete clinical pharmacist phone list  06/16/2022 5:24 PM

## 2022-06-16 NOTE — Progress Notes (Signed)
[address]       [place] [address]             [phone]      2 Days Post-Op  Procedure(s) (LRB): AORTIC VALVE REPLACEMENT (AVR) USING SJM REGENT MECHANICAL HEART VALVE SIZE (N/A) TRANSESOPHAGEAL ECHOCARDIOGRAM (N/A) Total Length of Stay:  LOS: 2 days   SUBJECTIVE: More oxygen needs Lost ability to Apace over night   Vitals:   06/16/22 0500 06/16/22 0600  BP: 117/60 97/82  Pulse:    Resp: 14 (!) 24  Temp:    SpO2: 92% 90%    Intake/Output      04/24 0701 04/25 0700 04/25 0701 04/26 0700   I.V. (mL/kg) 159 (1.7)    Blood     IV Piggyback 150    Total Intake(mL/kg) 309 (3.3)    Urine (mL/kg/hr) 1350 (0.6)    Blood     Chest Tube 20    Total Output 1370    Net -1061             sodium chloride Stopped (06/15/22 0952)   sodium chloride     sodium chloride 20 mL/hr at 06/15/22 1700   dexmedetomidine (PRECEDEX) IV infusion Stopped (06/14/22 1708)   insulin Stopped (06/15/22 1256)   lactated ringers     lactated ringers     lactated ringers Stopped (06/15/22 1301)   phenylephrine (NEO-SYNEPHRINE) Adult infusion Stopped (06/14/22 1856)    CBC    Component Value Date/Time   WBC 15.8 (H) 06/16/2022 0325   RBC 2.85 (L) 06/16/2022 0325   HGB 7.9 (L) 06/16/2022 0325   HCT 23.6 (L) 06/16/2022 0325   PLT 85 (L) 06/16/2022 0325   MCV 82.8 06/16/2022 0325   MCH 27.7 06/16/2022 0325   MCHC 33.5 06/16/2022 0325   RDW 13.2 06/16/2022 0325   LYMPHSABS 2.0 04/16/2022 0056   MONOABS 0.6 04/16/2022 0056   EOSABS 0.1 04/16/2022 0056   BASOSABS 0.0 04/16/2022 0056   CMP     Component Value Date/Time   NA 126 (L) 06/16/2022 0325   K 5.9 (H) 06/16/2022 0325   CL 94 (L) 06/16/2022 0325   CO2 21 (L) 06/16/2022 0325   GLUCOSE 211 (H) 06/16/2022 0325   BUN 61 (H) 06/16/2022 0325   CREATININE 2.80 (H) 06/16/2022 0325   CALCIUM 8.0 (L) 06/16/2022 0325   PROT 7.6 06/10/2022 1355   ALBUMIN 3.5 06/10/2022 1355   AST 32 06/10/2022 1355    ALT 38 06/10/2022 1355   ALKPHOS 162 (H) 06/10/2022 1355   BILITOT 0.4 06/10/2022 1355   GFRNONAA 19 (L) 06/16/2022 0325   ABG    Component Value Date/Time   PHART 7.299 (L) 06/14/2022 2116   PCO2ART 47.8 06/14/2022 2116   PO2ART 110 (H) 06/14/2022 2116   HCO3 23.5 06/14/2022 2116   TCO2 25 06/14/2022 2116   ACIDBASEDEF 3.0 (H) 06/14/2022 2116   O2SAT 98 06/14/2022 2116   CBG (last 3)  Recent Labs    06/15/22 1942 06/15/22 2344 06/16/22 0326  GLUCAP 198* 198* 151*  EXAM Lungs: crackles and decreased breath sounds at bases Card: RR with sharp valve sounds  Ext: cool and dry Neuro: alert    ASSESSMENT: POD #2 Sp AVR mechanical Hemodynamics not optimal with VVI pacing. Not sure what underlying rhythm is and concerned may need PPM if completed heart block. Will have EP consulted, check Echo today Renal: Cr continues to rise and K still  5.9. With Na low will hold on diuretics today and observe. Concerned about fluid status and lung issues Pulmonary edema; encourage deep breathing. OOB to chair Needs to stay in unit today   Eugenio Hoes, MD 06/16/2022

## 2022-06-16 NOTE — Progress Notes (Addendum)
Patient Name: Chelsea English Date of Encounter: 06/17/2022  Primary Cardiologist: None Electrophysiologist: New  Interval Summary   Overnight started conducting, NSR. No longer pacing  The patient is doing well today.  At this time, the patient denies chest pain, shortness of breath, or any new concerns.  Inpatient Medications    Scheduled Meds:  acetaminophen  1,000 mg Oral Q6H   Or   acetaminophen (TYLENOL) oral liquid 160 mg/5 mL  1,000 mg Per Tube Q6H   aspirin EC  81 mg Oral Daily   atorvastatin  40 mg Oral QHS   bisacodyl  10 mg Oral Daily   Or   bisacodyl  10 mg Rectal Daily   Chlorhexidine Gluconate Cloth  6 each Topical Daily   docusate sodium  200 mg Oral Daily   insulin aspart  0-24 Units Subcutaneous Q4H   insulin detemir  8 Units Subcutaneous Q12H   mouth rinse  15 mL Mouth Rinse 4 times per day   pantoprazole  40 mg Oral Daily   sodium chloride flush  10-40 mL Intracatheter Q12H   sodium chloride flush  3 mL Intravenous Q12H   Continuous Infusions:  sodium chloride     furosemide (LASIX) 200 mg in dextrose 5 % 100 mL (2 mg/mL) infusion 10 mg/hr (06/17/22 0700)   phenylephrine (NEO-SYNEPHRINE) Adult infusion Stopped (06/14/22 1856)   PRN Meds: dextrose, metoprolol tartrate, ondansetron (ZOFRAN) IV, mouth rinse, oxyCODONE, phenol, sodium chloride flush, sodium chloride flush, traMADol   Vital Signs    Vitals:   06/17/22 0548 06/17/22 0600 06/17/22 0634 06/17/22 0700  BP:  120/75  (!) 131/56  Pulse:      Resp:  13 13 14   Temp:      TempSrc:      SpO2:  94% 95% 94%  Weight: 96.2 kg     Height:        Intake/Output Summary (Last 24 hours) at 06/17/2022 0729 Last data filed at 06/17/2022 0700 Gross per 24 hour  Intake 715.39 ml  Output 2695 ml  Net -1979.61 ml   Filed Weights   06/15/22 0500 06/16/22 0500 06/17/22 0548  Weight: 96.9 kg 92.6 kg 96.2 kg    Physical Exam    GEN- The patient is fatigued appearing, alert and oriented x 3 today.    Lungs- Clear to ausculation bilaterally, normal work of breathing Cardiac- Regular rate and rhythm, no murmurs, rubs or gallops GI- soft, NT, ND, + BS Extremities- no clubbing or cyanosis. No edema  Telemetry    Overnight conduction improved, now NSR with 1st degree AV block (personally reviewed)  Hospital Course    Chelsea English is a 62 y.o. female with a history of severe AS, HTN, CKD III, DM2 who presented for elective AVR and is being seen today for the evaluation of heart block at the request of Dr. Leafy Ro.   Assessment & Plan    Second degree AV block S/p Mechanical AVR No conduction disease at baseline Would still recommend avoidingvoid AV nodal agents. She is conducting this am.  Decrease pacer to VVI 40   Severe AS S/p mechanical AVR POD #3   Acute hypoxic respiratory failure Extubated 4/24 but has continued to require oxygen.  CCM following   AKI Cr 1.8 -> 2.36 -> 2.8 -> 3.07 -> 2.79 Follow   Hyperkalemia K 4.3 today Per primary team.  Pt now in NSR. EP to see as needed. Please call back with questions or further bradycardia. Would avoid AV  nodal agents.   For questions or updates, please contact [facility] Please consult [email or web address] for contact info under Cardiology/STEMI.  Signed, Graciella Freer, PA-C  06/17/2022, 7:29 AM   I have seen and examined this patient with Otilio Saber.  Agree with above, note added to reflect my findings.  Patient doing well this morning.  Fortunately conduction has improved.  No chest pain or shortness of breath.  GEN: Well nourished, well developed, in no acute distress  HEENT: normal  Neck: no JVD, carotid bruits, or masses Cardiac: RRR; no murmurs, rubs, or gallops,no edema  Respiratory:  clear to auscultation bilaterally, normal work of breathing GI: soft, nontender, nondistended, + BS MS: no deformity or atrophy  Skin: warm and dry Neuro:  Strength and sensation are intact Psych: euthymic mood,  full affect   Second-degree AV block patient had secondary AV block with a wide QRS complex post surgery.  Overnight, her conduction improved.  She is now conducting one-to-one without evidence of bradycardia or heart block.  For now, we Lanard Arguijo hold off on pacemaker implant.  Further therapy per cardiac surgery.  EP to sign off.  Lukas Pelcher M. Teyla Skidgel MD 06/17/2022 12:16 PM

## 2022-06-16 NOTE — Consult Note (Addendum)
ELECTROPHYSIOLOGY CONSULT NOTE    Patient ID: Chelsea English MRN: [id number], DOB/AGE: 63-Jan-1962 63 y.o.  Admit date: 06/14/2022 Date of Consult: 06/16/2022  Primary Physician: Chelsea March, PA-C Primary Cardiologist: None  Electrophysiologist: New   Referring Provider: Dr. Leafy English  Patient Profile: Chelsea English is a 62 y.o. female with a history of severe AS, HTN, CKD III, DM2 who presented for elective AVR and is being seen today for the evaluation of heart block at the request of Dr. Leafy English.  HPI:  Chelsea English is a 62 y.o. female with medical history as above  Had initially planned for TAVR but CTA raised concerns for potential future issues if she were to have valve deterioration and discussed surgery.   Pt presented for planned procedure 4/23. Post op course complicated by heart block, respiratory failure, and AKI  With concerns for underlying CHB EP asked to see and follow along for potential PPM.   Pt currently in bed on CPAP. Daughter present, translator used.  Pt verbalizes understanding that her multiple acute issues currently preclude PPM consideration, but depending on how she does she may require prior to discharge. They are hopeful she Casen Pryor continue to improve.   Labs Potassium5.9* (04/25 0325) Magnesium  3.3* (04/24 1642) Creatinine, ser  2.80* (04/25 0325) PLT  85* (04/25 0325) HGB  7.9* (04/25 0325) WBC 15.8* (04/25 0325)  .    Past Medical History:  Diagnosis Date   Anemia    Aortic stenosis    Coronary artery disease    Diabetes mellitus without complication    Heart murmur    Hyperlipidemia    Hypertension    Oxygen dependent    2L at night     Surgical History:  Past Surgical History:  Procedure Laterality Date   AORTIC VALVE REPLACEMENT N/A 06/14/2022   Procedure: AORTIC VALVE REPLACEMENT (AVR) USING SJM REGENT MECHANICAL HEART VALVE SIZE ;  Surgeon: Chelsea Hoes, MD;  Location: [facility] OR;  Service: Open Heart Surgery;   Laterality: N/A;   ARTERY BIOPSY Right 07/20/2021   Procedure: BIOPSY OF RIGHT TEMPORAL ARTERY;  Surgeon: Chelsea Hint, MD;  Location: [facility] OR;  Service: Vascular;  Laterality: Right;   cataract Right    HYSTEROSCOPY WITH D & C     removal of polyp   MULTIPLE TOOTH EXTRACTIONS     no teeth, no dentures   RIGHT AND LEFT HEART CATH N/A 04/19/2022   Procedure: RIGHT AND LEFT HEART CATH;  Surgeon: Chelsea Negus, MD;  Location: [facility] INVASIVE CV LAB;  Service: Cardiovascular;  Laterality: N/A;   TEE WITHOUT CARDIOVERSION N/A 06/14/2022   Procedure: TRANSESOPHAGEAL ECHOCARDIOGRAM;  Surgeon: Chelsea Hoes, MD;  Location: [facility] OR;  Service: Open Heart Surgery;  Laterality: N/A;     Medications Prior to Admission  Medication Sig Dispense Refill Last Dose   amLODipine (NORVASC) 5 MG tablet Take 5 mg by mouth daily.   06/14/2022   aspirin 81 MG chewable tablet Chew 81 mg by mouth daily.   Past Week   atorvastatin (LIPITOR) 40 MG tablet Take 40 mg by mouth at bedtime.   06/13/2022   BD INSULIN SYRINGE U/F 31G X 5/16" 1 ML MISC Inject 1 each into the skin 3 (three) times daily. Use to inject unsulin   06/13/2022   benzonatate (TESSALON) 100 MG capsule Take 100 mg by mouth 2 (two) times daily as needed for cough.      carvedilol (COREG) 3.125 MG tablet Take 1 tablet (3.125  mg total) by mouth 2 (two) times daily with a meal. 180 tablet 3 06/14/2022   cyclobenzaprine (FLEXERIL) 5 MG tablet Take 10 mg by mouth 2 (two) times daily as needed for muscle spasms.   06/13/2022   ferrous sulfate 325 (65 FE) MG tablet Take 650 mg by mouth daily with breakfast.   Past Week   furosemide (LASIX) 40 MG tablet Take 40 mg by mouth daily.   06/14/2022   gabapentin (NEURONTIN) 100 MG capsule Take 100 mg by mouth daily.   06/13/2022   insulin NPH-regular Human (HUMULIN 70/30) (70-30) 100 UNIT/ML injection Inject 40-60 Units into the skin See admin instructions. Injecting 60 units in the morning and 40 units at bedtime.    06/13/2022   JANUVIA 100 MG tablet Take 100 mg by mouth daily.   06/13/2022   Multiple Vitamins-Minerals (MULTI FOR HER 50+ PO) Take 1 tablet by mouth daily.   Past Week   polyethylene glycol (MIRALAX / GLYCOLAX) 17 g packet Take 17 g by mouth daily as needed for moderate constipation.   Past Week    Inpatient Medications:   acetaminophen  1,000 mg Oral Q6H   Or   acetaminophen (TYLENOL) oral liquid 160 mg/5 mL  1,000 mg Per Tube Q6H   aspirin EC  81 mg Oral Daily   atorvastatin  40 mg Oral QHS   bisacodyl  10 mg Oral Daily   Or   bisacodyl  10 mg Rectal Daily   Chlorhexidine Gluconate Cloth  6 each Topical Daily   docusate sodium  200 mg Oral Daily   insulin aspart  0-24 Units Subcutaneous Q4H   insulin detemir  8 Units Subcutaneous Q12H   pantoprazole  40 mg Oral Daily   sodium chloride flush  10-40 mL Intracatheter Q12H   sodium chloride flush  3 mL Intravenous Q12H   sodium zirconium cyclosilicate  10 g Oral BID    Allergies:  Allergies  Allergen Reactions   Penicillins Anaphylaxis    History reviewed. No pertinent family history.   Physical Exam: Vitals:   06/16/22 0740 06/16/22 0748 06/16/22 0800 06/16/22 0900  BP:   (!) 153/72 (!) 140/80  Pulse:      Resp: (!) 34  19 13  Temp:  (!) 97.5 F (36.4 C)    TempSrc:  Axillary    SpO2: 92%  95% 96%  Weight:      Height:        GEN- NAD, A&O x 3, normal affect HEENT: Normocephalic, atraumatic Lungs- CTAB, Normal effort.  Heart- Regular rate and rhythm, No M/G/R.  GI- Soft, NT, ND.  Extremities- No clubbing, cyanosis, or edema   Radiology/Studies: DG Chest Port 1 View  Result Date: 06/16/2022 CLINICAL DATA:  Status post aortic valve repair EXAM: PORTABLE CHEST 1 VIEW COMPARISON:  06/15/2022 FINDINGS: There is been interval removal of pulmonary arterial catheter. Right IJ Cordis remains in place with tip projecting over the proximal SVC. Mediastinal drain and chest tubes have been removed. No pneumothorax  identified. Stable cardiomediastinal contours. Low lung volumes. Persistent bilateral pleural effusions and bibasilar atelectasis. IMPRESSION: 1. Interval removal of mediastinal drain and chest tubes. No pneumothorax. 2. Persistent bilateral pleural effusions and bibasilar atelectasis. Electronically Signed   By: Chelsea English M.D.   On: 06/16/2022 08:43   ECHO INTRAOPERATIVE TEE  Result Date: 06/15/2022  *INTRAOPERATIVE TRANSESOPHAGEAL REPORT *  Patient Name:   ROTUNDA WORDEN Date of Exam: 06/14/2022 Medical Rec #:  [id number]  Height:       62.0 in Accession #:    [id number]     Weight:       200.0 lb Date of Birth:  02/16/1961      BSA:          1.91 m Patient Age:    61 years       BP:           158/75 mmHg Patient Gender: F              HR:           72 bpm. Exam Location:  Anesthesiology Transesophogeal exam was perform intraoperatively during surgical procedure. Patient was closely monitored under general anesthesia during the entirety of examination. Indications:     Aortic Stenosis i35.0 Sonographer:     Irving Burton Senior RDCS Performing Phys: [id number] Chelsea English Diagnosing Phys: Chelsea Loron MD Complications: No known complications during this procedure. POST-OP IMPRESSIONS _ Left Ventricle: The left ventricle is unchanged from pre-bypass. _ Right Ventricle: The right ventricle appears unchanged from pre-bypass. _ Aorta: The aorta appears unchanged from pre-bypass. _ Left Atrial Appendage: The left atrial appendage appears unchanged from pre-bypass. _ Aortic Valve: Mild stenosis present. A mechanical valve was placed, leaflets are freely mobile. Normal washing jets for valve type. No perivalvular leak noted. _ Mitral Valve: The mitral valve appears unchanged from pre-bypass. _ Tricuspid Valve: There is mild regurgitation.TR appears slightly worse, though VC measured 0.5. _ Pulmonic Valve: The pulmonic valve appears unchanged from pre-bypass. _ Interatrial Septum: The interatrial septum appears  unchanged from pre-bypass. PRE-OP FINDINGS  Left Ventricle: The left ventricle has normal systolic function, with an ejection fraction of 55-60%. The cavity size was normal. There is severe concentric left ventricular hypertrophy. Right Ventricle: The right ventricle has normal systolic function. The cavity was normal. There is no increase in right ventricular wall thickness. Left Atrium: Left atrial size was normal in size. No left atrial/left atrial appendage thrombus was detected. Right Atrium: Right atrial size was normal in size. Interatrial Septum: No atrial level shunt detected by color flow Doppler. Pericardium: There is no evidence of pericardial effusion. Mitral Valve: The mitral valve is normal in structure. Mitral valve regurgitation is mild by color flow Doppler. There is mild thickening present. Tricuspid Valve: The tricuspid valve was normal in structure. Tricuspid valve regurgitation is mild by color flow Doppler. There is no evidence of tricuspid valve vegetation. Aortic Valve: The aortic valve is tricuspid Aortic valve regurgitation is mild by color flow Doppler. There is moderate stenosis of the aortic valve, with a calculated valve area of 0.57 cm. There is moderate thickening and moderate calcification present on the aortic valve non-coronary cusp with severely decreased mobility and there is moderate thickening and moderate calcification present on the aortic valve left coronary cusp with moderately decreased mobility and there is moderate thickening and moderate calcification present on the aortic valve right coronary cusp with moderately decreased mobility. Pulmonic Valve: The pulmonic valve was normal in structure. Pulmonic valve regurgitation is mild by color flow Doppler. Aorta: The ascending aorta and aortic arch are normal in size and structure. There is evidence of plaque in the descending aorta; Grade I, measuring 1-72mm in size. +--------------+--------++ LEFT VENTRICLE          +--------------+--------++ PLAX 2D                +--------------+--------++ LVIDd:        4.20 cm  +--------------+--------++ LVIDs:  2.90 cm  +--------------+--------++ LV PW:        1.50 cm  +--------------+--------++ LV IVS:       1.70 cm  +--------------+--------++ LVOT diam:    1.80 cm  +--------------+--------++ LV SV:        46 ml    +--------------+--------++ LV SV Index:  22.77    +--------------+--------++ LVOT Area:    2.54 cm +--------------+--------++                        +--------------+--------++ +------------------+------------++ AORTIC VALVE                   +------------------+------------++ AV Area (Vmax):   0.56 cm     +------------------+------------++ AV Area (Vmean):  0.56 cm     +------------------+------------++ AV Area (VTI):    0.57 cm     +------------------+------------++ AV Vmax:          355.00 cm/s  +------------------+------------++ AV Vmean:         274.500 cm/s +------------------+------------++ AV VTI:           0.844 m      +------------------+------------++ AV Peak Grad:     50.4 mmHg    +------------------+------------++ AV Mean Grad:     33.0 mmHg    +------------------+------------++ LVOT Vmax:        78.40 cm/s   +------------------+------------++ LVOT Vmean:       60.600 cm/s  +------------------+------------++ LVOT VTI:         0.188 m      +------------------+------------++ LVOT/AV VTI ratio:0.22         +------------------+------------++ AR PHT:           691 msec     +------------------+------------++  +-------------+-------++ AORTA                +-------------+-------++ Ao Root diam:2.80 cm +-------------+-------++ Ao Asc diam: 2.90 cm +-------------+-------++  +--------------+-------+ SHUNTS                +--------------+-------+ Systemic VTI: 0.19 m  +--------------+-------+ Systemic Diam:1.80 cm +--------------+-------+  Chelsea Loron  MD Electronically signed by Chelsea Loron MD Signature Date/Time: 06/15/2022/11:23:02 AM    Final    DG Chest Port 1 View  Result Date: 06/15/2022 CLINICAL DATA:  [id number] S/P AVR (aortic valve replacement) [id number]. EXAM: PORTABLE CHEST 1 VIEW COMPARISON:  06/14/2022. FINDINGS: 0521 hours. Interval removal of the endotracheal tube and enteric tube. Unchanged right IJ approach pulmonary arterial catheter with tip projecting over the main pulmonary artery. Unchanged chest tubes x2 projecting over the midline. Low lung volumes accentuate the pulmonary vasculature and cardiomediastinal silhouette. Slightly increased bibasilar atelectasis. Unchanged small bilateral pleural effusions. No pneumothorax. IMPRESSION: 1. Interval removal of the endotracheal and enteric tubes. Otherwise unchanged support apparatus. 2. Lower lung volumes with slightly increased bibasilar atelectasis. Unchanged small bilateral pleural effusions. Electronically Signed   By: Chelsea English M.D.   On: 06/15/2022 08:24   DG Chest Port 1 View  Result Date: 06/14/2022 CLINICAL DATA:  Post AVR EXAM: PORTABLE CHEST 1 VIEW COMPARISON:  Portable exam 1208 hours compared to 06/10/2022 FINDINGS: Tip of endotracheal tube projects 2.4 cm above carina. Tip of RIGHT jugular Swan-Ganz catheter projects over main pulmonary artery bifurcation. Nasogastric tube extends into abdomen. Two mediastinal drains present. Prominent cardiac silhouette post AVR. Epicardial pacing wires present. Atherosclerotic calcifications aorta. Atelectasis at the mid to lower lungs bilaterally. No pulmonary infiltrate, pleural effusion, or pneumothorax. IMPRESSION: Expected postoperative changes as  above. Electronically Signed   By: Chelsea English M.D.   On: 06/14/2022 12:32   EP STUDY  Result Date: 06/14/2022 See surgical note for result.  DG Chest 2 View  Result Date: 06/10/2022 CLINICAL DATA:  Preop chest exam. Heart surgery. EXAM: CHEST - 2 VIEW COMPARISON:  Radiograph  04/15/2022 chest CT 05/03/2022 FINDINGS: The heart is upper normal in size. Mediastinal contours are normal. Mild aortic atherosclerosis. Resolution of previous pulmonary edema. No significant pleural effusion. No focal airspace disease. No acute osseous findings. IMPRESSION: Borderline cardiomegaly. No acute chest findings. Electronically Signed   By: Chelsea English M.D.   On: 06/10/2022 18:36    EKG:EKG from this am shows likely junctional rhythm with wide QRS, but irregularity raises question for very long 1st degree AV block. (personally reviewed)  Baseline EKG 03/2022 showed sinus tachycardia without conduction disease. Narrow QRS, PR interval ~200 ms  TELEMETRY: V pacing 80s this am. Overnight appeared to show intermittent A pacing, with V pacing and likely junctional rhythm underlying with CHB (personally reviewed)  Assessment/Plan:  Junctional bradycardia S/p Mechanical AVR No conduction disease at baseline Avoid AV nodal agents. Chelsea English follow along her course. With current poor respiratory status and elevated K, would optimize and continue to monitor her conduction.  May end up needing pacing.   Severe AS S/p mechanical AVR POD #2  Acute hypoxic respiratory failure Extubated 4/24 but has continued to require oxygen.  Currently on CPAP. CCM following  AKI Cr 1.8 -> 2.36 -> 2.8 Follow  Hyperkalemia K 5.9 today Per primary team.   EP Chelsea English follow along for pacing needs. Multiple acute issues currently preclude PPM consideration. Hopefully as her other acute issues improve and as she gets further out from surgery her conduction Chelsea English also improve.   For questions or updates, please contact Chelsea English Please consult [email or web address] for contact info under Cardiology/STEMI.  Chelsea Flock, PA-C  06/16/2022 9:45 AM   I have seen and examined this patient with Chelsea English.  Agree with above, note added to reflect my findings.  Patient is post AVR with a  mechanical valve.  She has had heart block postsurgery.  She is postop day 2.  She has been put on CPAP today due to increased work of breathing and oxygen requirements.  GEN: Well nourished, well developed, in no acute distress  HEENT: normal  Neck: no JVD, carotid bruits, or masses Cardiac: RRR; no murmurs, rubs, or gallops,no edema  Respiratory:  clear to auscultation bilaterally, normal work of breathing GI: soft, nontender, nondistended, + BS MS: no deformity or atrophy  Skin: warm and dry Neuro:  Strength and sensation are intact Psych: euthymic mood, full affect   Second-degree AV block: Has significant bradycardia with a newly wide QRS.  Appears that she is having Mobitz 1 AV block on her EKG today.  As she is on CPAP with increasing oxygen requirements, and is hyperkalemic, would hold off on pacemaker implant for now.  Nefi Musich allow her ongoing issues to improve.  If conduction does not improve with diuresis, normalization of potassium, and time, Milyn Stapleton likely need pacemaker implant.  Waldon Sheerin M. Sariah Henkin MD 06/16/2022 12:53 PM

## 2022-06-16 NOTE — Discharge Summary (Signed)
Physician Discharge Summary       [address] [place].[address]       [name] [address]             [phone]    Patient ID: Chelsea English MRN: [id number] DOB/AGE: 1961-01-16 62 y.o.  Admit date: 06/14/2022 Discharge date: 06/22/2022  Admission Diagnoses:  Discharge Diagnoses:  Principal Problem:   S/P AVR (aortic valve replacement)   Patient Active Problem List   Diagnosis Date Noted   S/P AVR (aortic valve replacement) 06/14/2022   Type 2 diabetes mellitus with hyperlipidemia (HCC) 04/16/2022   CHF (congestive heart failure) (HCC) 04/15/2022   Pulmonary edema 04/15/2022   Acute on chronic diastolic CHF (congestive heart failure) (HCC) 04/15/2022   Acute hypoxic respiratory failure (HCC) 04/15/2022   SIRS (systemic inflammatory response syndrome) (HCC) 04/15/2022   Lactic acidosis 04/15/2022   Transaminitis 04/15/2022   Elevated troponin 04/15/2022   Nodule of upper lobe of right lung 04/15/2022   History of anemia due to chronic kidney disease 04/15/2022   Stage 3b chronic kidney disease (CKD) (HCC) 04/15/2022   Leukocytosis 04/15/2022   Diabetic retinopathy (HCC) 07/21/2021   Cataract, left eye 07/21/2021   Hypertensive retinopathy 07/21/2021   AKI (acute kidney injury) (HCC) 07/21/2021   HLD (hyperlipidemia) 07/21/2021   Chronic pain 07/21/2021   Systolic murmur 07/21/2021   AV block, 1st degree 07/21/2021   Class 2 obesity 07/21/2021   Vision changes - concern for right side temporal arteritis 07/17/2021   GERD (gastroesophageal reflux disease) 08/09/2019   CKD (chronic kidney disease) stage 2, GFR 60-89 ml/min 08/09/2019   Essential hypertension 11/20/2017   DM2 (diabetes mellitus, type 2) (HCC) 11/20/2017     Consults: pulmonary/intensive care  Procedure (s): Surgery   06/14/2022 Lian Pounds [id number]   Surgeon:  Ashley Akin, MD   First Assistant: Gershon Crane [facility]                                 Preoperative Diagnosis:  Aortic Stenosis    Postoperative Diagnosis:  Same     Procedure: Aortic Valve Replacement with a 21mm St Jude Mechanical prosthesis (SN [id number])   Anesthesia:  General Endotracheal   Patwardhan, Anabel Bene, MD [place], Mady Haagensen, PA-C    History of present illness:  The patient is a 62 year old female referred to Dr. Leafy Ro with critical aortic stenosis.  She was initially evaluated for TAVR however following chest CTA she was noted to have potential issues in the future if valve deterioration occurred and surgery was again readdressed and ultimately it was concluded that this would be her best option.  She was admitted this hospitalization electively for the procedure.  Hospital course:  The patient was admitted electively and taken the operating room on 06/14/2022 at which time she underwent aortic valve replacement using a Probation officer prosthetic.  Size 21 mm.  She tolerated the procedure well was taken to the surgical intensive care unit in stable condition.  Vital signs and hemodynamics remained stable.  6:30 PM on day of surgery.  He had a slow junctional rhythm so pacing was continued utilizing epicardial pacing leads.  Electrophysiology service was consulted when she developed second-degree heart block.  Continue observation with normalization of potassium was recommended.  She eventually had return with PACs.  Glucose was monitored carefully and.  This was later transitioned to Levemir and sliding scale insulin.  Continue to  remove the old off on initiating only postoperatively thrombocytopenia.  After platelet count recovered to around 100,000 Coumadin was started 5 mg daily.  INR was monitored daily.  Most recent platelet count on 06/21/2022 was 149 K.  Diuresis was initiated for expected volume excess.  She had an expected rise in her creatinine of her history of stage IIIb kidney disease.  Creatinine peaked at 3.  She was diuresed over the course of several days with appropriate  response.  Creatinine has continued to improve and on 06/21/2022 was 1.73.  The patient wires were removed on postop day 5.  PT  and OT assessments were requested to assist with discharge planning.  She was also being evaluated for need for home oxygen.  She does wear home oxygen at night but has had some desaturations with ambulation and did qualify.  These arrangements will be made prior to discharge.  Incisions are noted to be healing well without evidence of infection.  She is tolerating diet and routine cardiac rehab modalities albeit somewhat slow to progress.  Overall, at the time of discharge the patient was felt to be quite stable.     Latest Vital Signs: Blood pressure 136/71, pulse 84, temperature 98.4 F (36.9 C), temperature source Oral, resp. rate 20, height 5\' 2"  (1.575 m), weight 90.6 kg, SpO2 94 %.  Physical Exam:  General appearance: alert, cooperative, and no distress Heart: regular rate and rhythm Lungs: mildly  dim in bases Abdomen: benign Extremities: minor edema Wound: incis healing well  Discharge Condition: Good  Recent laboratory studies:  Lab Results  Component Value Date   WBC 16.5 (H) 06/21/2022   HGB 7.8 (L) 06/21/2022   HCT 23.7 (L) 06/21/2022   MCV 84.6 06/21/2022   PLT 149 (L) 06/21/2022   Lab Results  Component Value Date   NA 132 (L) 06/21/2022   K 4.9 06/21/2022   CL 91 (L) 06/21/2022   CO2 27 06/21/2022   CREATININE 1.73 (H) 06/21/2022   GLUCOSE 248 (H) 06/21/2022      Diagnostic Studies: DG Chest 2 View  Result Date: 06/20/2022 CLINICAL DATA:  Status post aortic valve replacement. EXAM: CHEST - 2 VIEW COMPARISON:  06/17/2022 FINDINGS: Stable heart size after median sternotomy and aortic valve replacement. Lower bilateral lung volumes with bibasilar atelectasis, left greater than right, and probable small bilateral pleural effusions. No overt pulmonary edema or pneumothorax. IMPRESSION: Lower bilateral lung volumes with bibasilar atelectasis  and probable small bilateral pleural effusions. Electronically Signed   By: Irish Lack M.D.   On: 06/20/2022 08:03   DG CHEST PORT 1 VIEW  Result Date: 06/17/2022 CLINICAL DATA:  Status post AVR. EXAM: PORTABLE CHEST 1 VIEW COMPARISON:  Chest x-ray June 16, 2022. FINDINGS: Small to moderate right pleural effusion tracking along the right fissure. Enlarged cardiac silhouette. Median sternotomy. Pulmonary vascular congestion. No overt pulmonary edema. Left basilar opacities, likely atelectasis. No visible pneumothorax. IMPRESSION: Right pleural effusion, cardiomegaly, and probable left basilar atelectasis. Electronically Signed   By: Feliberto Harts M.D.   On: 06/17/2022 08:07   ECHOCARDIOGRAM COMPLETE  Result Date: 06/16/2022    ECHOCARDIOGRAM REPORT   Patient Name:   KUMARI SCULLEY Date of Exam: 06/16/2022 Medical Rec #:  [id number]      Height:       62.0 in Accession #:    [id number]     Weight:       204.1 lb Date of Birth:  [birth year]      BSA:  1.928 m Patient Age:    61 years       BP:           144/86 mmHg Patient Gender: F              HR:           86 bpm. Exam Location:  Inpatient Procedure: 2D Echo, Cardiac Doppler and Color Doppler Indications:    S/P Aortic Valve replacement Z95.2  History:        Patient has prior history of Echocardiogram examinations, most                 recent 04/16/2022. CAD, Aortic Valve Disease,                 Signs/Symptoms:Murmur; Risk Factors:Diabetes, Hypertension and                 Dyslipidemia.                 Aortic Valve: 21 mm St. Jude mechanical valve is present in the                 aortic position. Procedure Date: 06/14/22.  Sonographer:    Aron Baba Referring Phys: [id number] Eugenio Hoes  Sonographer Comments: Technically challenging study due to limited acoustic windows and suboptimal parasternal window. Image acquisition challenging due to patient body habitus and Image acquisition challenging due to respiratory motion. IMPRESSIONS  1. Left  ventricular ejection fraction, by estimation, is 50 to 55%. The left ventricle has low normal function. Left ventricular endocardial border not optimally defined to evaluate regional wall motion. Left ventricular diastolic parameters are indeterminate. Elevated left ventricular end-diastolic pressure.  2. Right ventricular systolic function was not well visualized. The right ventricular size is not well visualized.  3. The mitral valve is degenerative. Trivial mitral valve regurgitation. No evidence of mitral stenosis.  4. The aortic valve has been repaired/replaced. Trivial perivalavular AI. There is a 21 mm St. Jude mechanical valve present in the aortic position. Procedure Date: 06/14/22. Aortic valve mean gradient measures 15.0 mmHg. Aortic valve Vmax measures 2.51 m/s. DVI 0.30. Mild stenosis. FINDINGS  Left Ventricle: Left ventricular ejection fraction, by estimation, is 50 to 55%. The left ventricle has low normal function. Left ventricular endocardial border not optimally defined to evaluate regional wall motion. The left ventricular internal cavity  size was normal in size. There is no left ventricular hypertrophy. Abnormal (paradoxical) septal motion consistent with post-operative status. Left ventricular diastolic parameters are indeterminate. Elevated left ventricular end-diastolic pressure. Right Ventricle: The right ventricular size is not well visualized. Right vetricular wall thickness was not assessed. Right ventricular systolic function was not well visualized. Left Atrium: Left atrial size was normal in size. Right Atrium: Right atrial size was normal in size. Pericardium: There is no evidence of pericardial effusion. Mitral Valve: The mitral valve is degenerative in appearance. Mild mitral annular calcification. Trivial mitral valve regurgitation. No evidence of mitral valve stenosis. Tricuspid Valve: The tricuspid valve is normal in structure. Tricuspid valve regurgitation is mild . No evidence  of tricuspid stenosis. Aortic Valve: The aortic valve has been repaired/replaced. Aortic valve regurgitation is trivial. Mild aortic stenosis is present. Aortic valve mean gradient measures 15.0 mmHg. Aortic valve peak gradient measures 25.2 mmHg. Aortic valve area, by VTI measures 1.05 cm. There is a 21 mm St. Jude mechanical valve present in the aortic position. Procedure Date: 06/14/22. Pulmonic Valve: The pulmonic valve was normal in structure.  Pulmonic valve regurgitation is trivial. No evidence of pulmonic stenosis. Aorta: The aortic root is normal in size and structure. Venous: The inferior vena cava was not well visualized. IAS/Shunts: No atrial level shunt detected by color flow Doppler.  LEFT VENTRICLE PLAX 2D LVOT diam:     2.10 cm     Diastology LV SV:         54          LV e' medial:    5.18 cm/s LV SV Index:   28          LV E/e' medial:  23.0 LVOT Area:     3.46 cm    LV e' lateral:   7.43 cm/s                            LV E/e' lateral: 16.0  LV Volumes (MOD) LV vol d, MOD A2C: 48.3 ml LV vol d, MOD A4C: 64.6 ml LV vol s, MOD A2C: 19.9 ml LV vol s, MOD A4C: 35.0 ml LV SV MOD A2C:     28.4 ml LV SV MOD A4C:     64.6 ml LV SV MOD BP:      29.8 ml RIGHT VENTRICLE RV S prime:     5.78 cm/s TAPSE (M-mode): 1.2 cm LEFT ATRIUM           Index        RIGHT ATRIUM           Index LA Vol (A2C): 54.2 ml 28.11 ml/m  RA Area:     16.20 cm LA Vol (A4C): 64.9 ml 33.65 ml/m  RA Volume:   36.80 ml  19.08 ml/m  AORTIC VALVE AV Area (Vmax):    1.24 cm AV Area (Vmean):   1.24 cm AV Area (VTI):     1.05 cm AV Vmax:           251.00 cm/s AV Vmean:          179.000 cm/s AV VTI:            0.510 m AV Peak Grad:      25.2 mmHg AV Mean Grad:      15.0 mmHg LVOT Vmax:         90.20 cm/s LVOT Vmean:        64.100 cm/s LVOT VTI:          0.155 m LVOT/AV VTI ratio: 0.30  AORTA Ao Root diam: 3.75 cm Ao Asc diam:  2.80 cm MITRAL VALVE                TRICUSPID VALVE MV Area (PHT): 3.68 cm     TR Peak grad:   28.1 mmHg MV  Decel Time: 206 msec     TR Vmax:        265.00 cm/s MV E velocity: 119.00 cm/s MV A velocity: 37.30 cm/s   SHUNTS MV E/A ratio:  3.19         Systemic VTI:  0.16 m                             Systemic Diam: 2.10 cm Armanda Magic MD Electronically signed by Armanda Magic MD Signature Date/Time: 06/16/2022/11:38:59 AM    Final    US RENAL  Result Date: 06/16/2022 CLINICAL DATA:  Acute kidney injury EXAM: RENAL / URINARY TRACT ULTRASOUND COMPLETE COMPARISON:  None Available. FINDINGS: Right Kidney: Renal measurements: 8.8 x 4.7 x 4.8 cm = volume: 104 mL. Cortical thinning. Slightly increased cortical echogenicity. No mass, hydronephrosis, or shadowing calcification. Left Kidney: Renal measurements: 10.0 x 5.3 x 5.9 cm = volume: 162 mL. Normal cortical thickness. Borderline increased cortical echogenicity. No mass, hydronephrosis, or shadowing calcification. Bladder: Decompressed, unable to evaluate Other: N/A IMPRESSION: Probable medical renal disease changes of both kidneys without renal mass or hydronephrosis. Electronically Signed   By: Ulyses Southward M.D.   On: 06/16/2022 09:44   DG Chest Port 1 View  Result Date: 06/16/2022 CLINICAL DATA:  Status post aortic valve repair EXAM: PORTABLE CHEST 1 VIEW COMPARISON:  06/15/2022 FINDINGS: There is been interval removal of pulmonary arterial catheter. Right IJ Cordis remains in place with tip projecting over the proximal SVC. Mediastinal drain and chest tubes have been removed. No pneumothorax identified. Stable cardiomediastinal contours. Low lung volumes. Persistent bilateral pleural effusions and bibasilar atelectasis. IMPRESSION: 1. Interval removal of mediastinal drain and chest tubes. No pneumothorax. 2. Persistent bilateral pleural effusions and bibasilar atelectasis. Electronically Signed   By: Signa Kell M.D.   On: 06/16/2022 08:43   ECHO INTRAOPERATIVE TEE  Result Date: 06/15/2022  *INTRAOPERATIVE TRANSESOPHAGEAL REPORT *  Patient Name:   MYRISSA CHIPLEY Date of Exam: 06/14/2022 Medical Rec #:  [id number]      Height:       62.0 in Accession #:    [id number]     Weight:       200.0 lb Date of Birth:  16-Aug-1960      BSA:          1.91 m Patient Age:    61 years       BP:           158/75 mmHg Patient Gender: F              HR:           72 bpm. Exam Location:  Anesthesiology Transesophogeal exam was perform intraoperatively during surgical procedure. Patient was closely monitored under general anesthesia during the entirety of examination. Indications:     Aortic Stenosis i35.0 Sonographer:     Irving Burton Senior RDCS Performing Phys: [id number] Eugenio Hoes Diagnosing Phys: Lewie Loron MD Complications: No known complications during this procedure. POST-OP IMPRESSIONS _ Left Ventricle: The left ventricle is unchanged from pre-bypass. _ Right Ventricle: The right ventricle appears unchanged from pre-bypass. _ Aorta: The aorta appears unchanged from pre-bypass. _ Left Atrial Appendage: The left atrial appendage appears unchanged from pre-bypass. _ Aortic Valve: Mild stenosis present. A mechanical valve was placed, leaflets are freely mobile. Normal washing jets for valve type. No perivalvular leak noted. _ Mitral Valve: The mitral valve appears unchanged from pre-bypass. _ Tricuspid Valve: There is mild regurgitation.TR appears slightly worse, though VC measured 0.5. _ Pulmonic Valve: The pulmonic valve appears unchanged from pre-bypass. _ Interatrial Septum: The interatrial septum appears unchanged from pre-bypass. PRE-OP FINDINGS  Left Ventricle: The left ventricle has normal systolic function, with an ejection fraction of 55-60%. The cavity size was normal. There is severe concentric left ventricular hypertrophy. Right Ventricle: The right ventricle has normal systolic function. The cavity was normal. There is no increase in right ventricular wall thickness. Left Atrium: Left atrial size was normal in size. No left atrial/left atrial appendage thrombus was  detected. Right Atrium: Right atrial size was normal in size. Interatrial Septum: No atrial level shunt detected by color flow Doppler. Pericardium: There  is no evidence of pericardial effusion. Mitral Valve: The mitral valve is normal in structure. Mitral valve regurgitation is mild by color flow Doppler. There is mild thickening present. Tricuspid Valve: The tricuspid valve was normal in structure. Tricuspid valve regurgitation is mild by color flow Doppler. There is no evidence of tricuspid valve vegetation. Aortic Valve: The aortic valve is tricuspid Aortic valve regurgitation is mild by color flow Doppler. There is moderate stenosis of the aortic valve, with a calculated valve area of 0.57 cm. There is moderate thickening and moderate calcification present on the aortic valve non-coronary cusp with severely decreased mobility and there is moderate thickening and moderate calcification present on the aortic valve left coronary cusp with moderately decreased mobility and there is moderate thickening and moderate calcification present on the aortic valve right coronary cusp with moderately decreased mobility. Pulmonic Valve: The pulmonic valve was normal in structure. Pulmonic valve regurgitation is mild by color flow Doppler. Aorta: The ascending aorta and aortic arch are normal in size and structure. There is evidence of plaque in the descending aorta; Grade I, measuring 1-58mm in size. +--------------+--------++ LEFT VENTRICLE         +--------------+--------++ PLAX 2D                +--------------+--------++ LVIDd:        4.20 cm  +--------------+--------++ LVIDs:        2.90 cm  +--------------+--------++ LV PW:        1.50 cm  +--------------+--------++ LV IVS:       1.70 cm  +--------------+--------++ LVOT diam:    1.80 cm  +--------------+--------++ LV SV:        46 ml    +--------------+--------++ LV SV Index:  22.77    +--------------+--------++ LVOT Area:    2.54  cm +--------------+--------++                        +--------------+--------++ +------------------+------------++ AORTIC VALVE                   +------------------+------------++ AV Area (Vmax):   0.56 cm     +------------------+------------++ AV Area (Vmean):  0.56 cm     +------------------+------------++ AV Area (VTI):    0.57 cm     +------------------+------------++ AV Vmax:          355.00 cm/s  +------------------+------------++ AV Vmean:         274.500 cm/s +------------------+------------++ AV VTI:           0.844 m      +------------------+------------++ AV Peak Grad:     50.4 mmHg    +------------------+------------++ AV Mean Grad:     33.0 mmHg    +------------------+------------++ LVOT Vmax:        78.40 cm/s   +------------------+------------++ LVOT Vmean:       60.600 cm/s  +------------------+------------++ LVOT VTI:         0.188 m      +------------------+------------++ LVOT/AV VTI ratio:0.22         +------------------+------------++ AR PHT:           691 msec     +------------------+------------++  +-------------+-------++ AORTA                +-------------+-------++ Ao Root diam:2.80 cm +-------------+-------++ Ao Asc diam: 2.90 cm +-------------+-------++  +--------------+-------+ SHUNTS                +--------------+-------+ Systemic VTI: 0.19 m  +--------------+-------+ Systemic Diam:1.80  cm +--------------+-------+  Lewie Loron MD Electronically signed by Lewie Loron MD Signature Date/Time: 06/15/2022/11:23:02 AM    Final    DG Chest Port 1 View  Result Date: 06/15/2022 CLINICAL DATA:  [id number] S/P AVR (aortic valve replacement) [id number]. EXAM: PORTABLE CHEST 1 VIEW COMPARISON:  06/14/2022. FINDINGS: 0521 hours. Interval removal of the endotracheal tube and enteric tube. Unchanged right IJ approach pulmonary arterial catheter with tip projecting over the main pulmonary artery. Unchanged chest  tubes x2 projecting over the midline. Low lung volumes accentuate the pulmonary vasculature and cardiomediastinal silhouette. Slightly increased bibasilar atelectasis. Unchanged small bilateral pleural effusions. No pneumothorax. IMPRESSION: 1. Interval removal of the endotracheal and enteric tubes. Otherwise unchanged support apparatus. 2. Lower lung volumes with slightly increased bibasilar atelectasis. Unchanged small bilateral pleural effusions. Electronically Signed   By: Orvan Falconer M.D.   On: 06/15/2022 08:24   DG Chest Port 1 View  Result Date: 06/14/2022 CLINICAL DATA:  Post AVR EXAM: PORTABLE CHEST 1 VIEW COMPARISON:  Portable exam 1208 hours compared to 06/10/2022 FINDINGS: Tip of endotracheal tube projects 2.4 cm above carina. Tip of RIGHT jugular Swan-Ganz catheter projects over main pulmonary artery bifurcation. Nasogastric tube extends into abdomen. Two mediastinal drains present. Prominent cardiac silhouette post AVR. Epicardial pacing wires present. Atherosclerotic calcifications aorta. Atelectasis at the mid to lower lungs bilaterally. No pulmonary infiltrate, pleural effusion, or pneumothorax. IMPRESSION: Expected postoperative changes as above. Electronically Signed   By: Ulyses Southward M.D.   On: 06/14/2022 12:32   EP STUDY  Result Date: 06/14/2022 See surgical note for result.  DG Chest 2 View  Result Date: 06/10/2022 CLINICAL DATA:  Preop chest exam. Heart surgery. EXAM: CHEST - 2 VIEW COMPARISON:  Radiograph 04/15/2022 chest CT 05/03/2022 FINDINGS: The heart is upper normal in size. Mediastinal contours are normal. Mild aortic atherosclerosis. Resolution of previous pulmonary edema. No significant pleural effusion. No focal airspace disease. No acute osseous findings. IMPRESSION: Borderline cardiomegaly. No acute chest findings. Electronically Signed   By: Narda Rutherford M.D.   On: 06/10/2022 18:36     Results for orders placed or performed during the hospital encounter of  06/14/22 (from the past 48 hour(s))  Glucose, capillary     Status: Abnormal   Collection Time: 06/20/22  5:14 PM  Result Value Ref Range   Glucose-Capillary 167 (H) 70 - 99 mg/dL    Comment: Glucose reference range applies only to samples taken after fasting for at least 8 hours.   Comment 1 Notify RN    Comment 2 Document in Chart   Glucose, capillary     Status: Abnormal   Collection Time: 06/20/22  8:48 PM  Result Value Ref Range   Glucose-Capillary 230 (H) 70 - 99 mg/dL    Comment: Glucose reference range applies only to samples taken after fasting for at least 8 hours.  Protime-INR     Status: Abnormal   Collection Time: 06/21/22  1:12 AM  Result Value Ref Range   Prothrombin Time 15.4 (H) 11.4 - 15.2 seconds   INR 1.2 0.8 - 1.2    Comment: (NOTE) INR goal varies based on device and disease states. Performed at [facility], [address], [place], [place] [address]   Basic metabolic panel     Status: Abnormal   Collection Time: 06/21/22  1:12 AM  Result Value Ref Range   Sodium 132 (L) 135 - 145 mmol/L   Potassium 4.9 3.5 - 5.1 mmol/L   Chloride 91 (L)  98 - 111 mmol/L   CO2 27 22 - 32 mmol/L   Glucose, Bld 248 (H) 70 - 99 mg/dL    Comment: Glucose reference range applies only to samples taken after fasting for at least 8 hours.   BUN 60 (H) 8 - 23 mg/dL   Creatinine, Ser 1.61 (H) 0.44 - 1.00 mg/dL   Calcium 8.9 8.9 - 09.6 mg/dL   GFR, Estimated 33 (L) >60 mL/min    Comment: (NOTE) Calculated using the CKD-EPI Creatinine Equation (2021)    Anion gap 14 5 - 15    Comment: Performed at [facility], [address] [place], [place] [address]  CBC     Status: Abnormal   Collection Time: 06/21/22  1:12 AM  Result Value Ref Range   WBC 16.5 (H) 4.0 - 10.5 K/uL   RBC 2.80 (L) 3.87 - 5.11 MIL/uL   Hemoglobin 7.8 (L) 12.0 - 15.0 g/dL   HCT 98.1 (L) 19.1 - 47.8 %   MCV 84.6 80.0 - 100.0 fL   MCH 27.9 26.0 - 34.0 pg   MCHC 32.9 30.0 - 36.0 g/dL   RDW 29.5  62.1 - 30.8 %   Platelets 149 (L) 150 - 400 K/uL   nRBC 0.4 (H) 0.0 - 0.2 %    Comment: Performed at [facility], [address] [place], [place] [address]  Glucose, capillary     Status: Abnormal   Collection Time: 06/21/22  6:47 AM  Result Value Ref Range   Glucose-Capillary 233 (H) 70 - 99 mg/dL    Comment: Glucose reference range applies only to samples taken after fasting for at least 8 hours.  Glucose, capillary     Status: Abnormal   Collection Time: 06/21/22 12:26 PM  Result Value Ref Range   Glucose-Capillary 233 (H) 70 - 99 mg/dL    Comment: Glucose reference range applies only to samples taken after fasting for at least 8 hours.  Glucose, capillary     Status: Abnormal   Collection Time: 06/21/22  5:34 PM  Result Value Ref Range   Glucose-Capillary 201 (H) 70 - 99 mg/dL    Comment: Glucose reference range applies only to samples taken after fasting for at least 8 hours.  Glucose, capillary     Status: Abnormal   Collection Time: 06/21/22  9:37 PM  Result Value Ref Range   Glucose-Capillary 159 (H) 70 - 99 mg/dL    Comment: Glucose reference range applies only to samples taken after fasting for at least 8 hours.   Comment 1 Notify RN    Comment 2 Document in Chart   Protime-INR     Status: Abnormal   Collection Time: 06/22/22  2:11 AM  Result Value Ref Range   Prothrombin Time 16.7 (H) 11.4 - 15.2 seconds   INR 1.4 (H) 0.8 - 1.2    Comment: (NOTE) INR goal varies based on device and disease states. Performed at [facility], [address], [place], [place] [address]   Glucose, capillary     Status: Abnormal   Collection Time: 06/22/22  6:06 AM  Result Value Ref Range   Glucose-Capillary 126 (H) 70 - 99 mg/dL    Comment: Glucose reference range applies only to samples taken after fasting for at least 8 hours.   Comment 1 Notify RN    Comment 2 Document in Chart   Glucose, capillary     Status: Abnormal   Collection Time: 06/22/22 12:09  PM  Result  Value Ref Range   Glucose-Capillary 186 (H) 70 - 99 mg/dL    Comment: Glucose reference range applies only to samples taken after fasting for at least 8 hours.     Discharge Instructions     Amb Referral to Cardiac Rehabilitation   Complete by: As directed    Diagnosis: Valve Replacement   Valve: Aortic   After initial evaluation and assessments completed: Virtual Based Care may be provided alone or in conjunction with Phase 2 Cardiac Rehab based on patient barriers.: Yes   Intensive Cardiac Rehabilitation (ICR) [facility] location only OR Traditional Cardiac Rehabilitation (TCR) *If criteria for ICR are not met will enroll in TCR [facility] only): Yes   Discharge patient   Complete by: As directed    After oxygen arrangements are in place   Discharge disposition: 01-Home or Self Care   Discharge patient date: 06/22/2022       Discharge Medications: Allergies as of 06/22/2022       Reactions   Penicillins Anaphylaxis        Medication List     STOP taking these medications    benzonatate 100 MG capsule Commonly known as: TESSALON   carvedilol 3.125 MG tablet Commonly known as: COREG   cyclobenzaprine 5 MG tablet Commonly known as: FLEXERIL       TAKE these medications    amLODipine 5 MG tablet Commonly known as: NORVASC Take 5 mg by mouth daily.   aspirin 81 MG chewable tablet Chew 81 mg by mouth daily.   atorvastatin 40 MG tablet Commonly known as: LIPITOR Take 40 mg by mouth at bedtime.   BD Insulin Syringe U/F 31G X 5/16" 1 ML Misc Generic drug: Insulin Syringe-Needle U-100 Inject 1 each into the skin 3 (three) times daily. Use to inject unsulin   ferrous sulfate 325 (65 FE) MG tablet Take 650 mg by mouth daily with breakfast.   furosemide 40 MG tablet Commonly known as: LASIX Take 1 tablet (40 mg total) by mouth daily.   gabapentin 100 MG capsule Commonly known as: NEURONTIN Take 100 mg by mouth daily.   HumuLIN 70/30 (70-30) 100 UNIT/ML  injection Generic drug: insulin NPH-regular Human Inject 40-60 Units into the skin See admin instructions. Injecting 40 units in the morning and 20 units at bedtime.adjust to previous dose if sugars elevated What changed: additional instructions   Januvia 100 MG tablet Generic drug: sitaGLIPtin Take 100 mg by mouth daily.   MULTI FOR HER 50+ PO Take 1 tablet by mouth daily.   polyethylene glycol 17 g packet Commonly known as: MIRALAX / GLYCOLAX Take 17 g by mouth daily as needed for moderate constipation.   traMADol 50 MG tablet Commonly known as: ULTRAM Take 1 tablet (50 mg total) by mouth every 6 (six) hours as needed for up to 7 days for moderate pain.   warfarin 5 MG tablet Commonly known as: COUMADIN Take 1.5 tablets (7.5 mg total) by mouth one time only at 4 PM. Dose as adjusted per the coumadin clinic at cardiology office               Durable Medical Equipment  (From admission, onward)           Start     Ordered   06/22/22 0909  For home use only DME oxygen  Once       Question Answer Comment  Length of Need 6 Months   Mode or (Route) Nasal cannula  Liters per Minute 2   Frequency Continuous (stationary and portable oxygen unit needed)   Oxygen delivery system Gas      06/22/22 0908   06/22/22 0736  For home use only DME Shower stool  Once       Comments: Shower seat   06/22/22 0735   06/20/22 0817  For home use only DME Walker rolling  Once       Question Answer Comment  Walker: With 5 Inch Wheels   Patient needs a walker to treat with the following condition Physical deconditioning      06/20/22 0817            Follow Up Appointments:  Follow-up Information     [employer] Follow up.   Why: Rolling walker to be delivered to the room. Contact information: [address], [place], [place] [address] Phone: [phone]        Patwardhan, Anabel Bene, MD Follow up.   Specialties: Cardiology, Radiology Why: May 13th at 11:15 to see  cardiologist. coumadin- blood test appointment(INR)-Thursday, May 2 at 11 AM at the cardiologist office Contact information: [address] [place] [address] [phone]                 Signed: Noel Christmas 06/22/2022, 1:41 PM

## 2022-06-16 NOTE — Progress Notes (Signed)
  Echocardiogram 2D Echocardiogram has been performed.  Chelsea English 06/16/2022, 10:58 AM

## 2022-06-16 NOTE — Progress Notes (Addendum)
ANTICOAGULATION CONSULT NOTE - Initial Consult  Pharmacy Consult for IV heparin Indication: AVR - start of Coumadin delayed d/t clinical status  Allergies  Allergen Reactions   Penicillins Anaphylaxis    Patient Measurements: Height:  (157.5 cm) Weight: 92.6 kg (204 lb 2.3 oz) IBW/kg (Calculated) : 50.1 Heparin Dosing Weight: 71.1 kg  Vital Signs: Temp: 97.5 F (36.4 C) (04/25 0748) Temp Source: Axillary (04/25 0748) BP: 144/86 (04/25 1000)  Labs: Recent Labs    06/14/22 1156 06/14/22 1721 06/15/22 0405 06/15/22 1335 06/15/22 1642 06/16/22 0325  HGB 8.6*   < > 8.2*  --  8.2* 7.9*  HCT 25.4*   < > 25.7*  --  24.5* 23.6*  PLT 140*   < > 119*  --  97* 85*  APTT 42*  --   --   --   --   --   LABPROT 18.0*  --   --   --   --   --   INR 1.5*  --   --   --   --   --   CREATININE  --    < > 1.86* 2.32*  --  2.80*   < > = values in this interval not displayed.    Estimated Creatinine Clearance: 22.1 mL/min (A) (by C-G formula based on SCr of 2.8 mg/dL (H)).   Medical History: Past Medical History:  Diagnosis Date   Anemia    Aortic stenosis    Coronary artery disease    Diabetes mellitus without complication    Heart murmur    Hyperlipidemia    Hypertension    Oxygen dependent    2L at night    Medications:  Infusions:   sodium chloride Stopped (06/15/22 0952)   sodium chloride     sodium chloride 20 mL/hr at 06/15/22 1700   heparin 700 Units/hr (06/16/22 1000)   insulin Stopped (06/15/22 1256)   lactated ringers     lactated ringers     lactated ringers Stopped (06/15/22 1301)   phenylephrine (NEO-SYNEPHRINE) Adult infusion Stopped (06/14/22 1856)    Assessment: Per discussion with Dr. Leafy Ro, will start heparin drip at low rate until able to initiate Coumadin.    Goal of Therapy:  Will aim to keep heparin level low ~ 0.2-0.3 Monitor platelets by anticoagulation protocol: Yes   Plan:  Start IV heparin gtt at 700 units/hr. Check heparin  level in 8 hrs. Daily heparin level and CBC.  Reece Leader, Colon Flattery, [facility] Clinical Pharmacist  06/16/2022 11:07 AM   [facility] pharmacy phone numbers are listed on [email or web address]

## 2022-06-17 ENCOUNTER — Inpatient Hospital Stay (HOSPITAL_COMMUNITY): Payer: Medicaid Other

## 2022-06-17 DIAGNOSIS — Z952 Presence of prosthetic heart valve: Secondary | ICD-10-CM | POA: Diagnosis not present

## 2022-06-17 DIAGNOSIS — I441 Atrioventricular block, second degree: Secondary | ICD-10-CM | POA: Diagnosis not present

## 2022-06-17 DIAGNOSIS — J9601 Acute respiratory failure with hypoxia: Secondary | ICD-10-CM | POA: Diagnosis not present

## 2022-06-17 LAB — TECHNOLOGIST SMEAR REVIEW: Plt Morphology: DECREASED

## 2022-06-17 LAB — BASIC METABOLIC PANEL
Anion gap: 13 (ref 5–15)
Anion gap: 13 (ref 5–15)
Anion gap: 13 (ref 5–15)
BUN: 73 mg/dL — ABNORMAL HIGH (ref 8–23)
BUN: 71 mg/dL — ABNORMAL HIGH (ref 8–23)
BUN: 76 mg/dL — ABNORMAL HIGH (ref 8–23)
CO2: 21 mmol/L — ABNORMAL LOW (ref 22–32)
CO2: 23 mmol/L (ref 22–32)
CO2: 24 mmol/L (ref 22–32)
Calcium: 8 mg/dL — ABNORMAL LOW (ref 8.9–10.3)
Calcium: 8.2 mg/dL — ABNORMAL LOW (ref 8.9–10.3)
Calcium: 8.1 mg/dL — ABNORMAL LOW (ref 8.9–10.3)
Chloride: 91 mmol/L — ABNORMAL LOW (ref 98–111)
Chloride: 92 mmol/L — ABNORMAL LOW (ref 98–111)
Chloride: 92 mmol/L — ABNORMAL LOW (ref 98–111)
Creatinine, Ser: 2.79 mg/dL — ABNORMAL HIGH (ref 0.44–1.00)
Creatinine, Ser: 2.69 mg/dL — ABNORMAL HIGH (ref 0.44–1.00)
Creatinine, Ser: 2.5 mg/dL — ABNORMAL HIGH (ref 0.44–1.00)
GFR, Estimated: 19 mL/min — ABNORMAL LOW (ref >60)
GFR, Estimated: 19 mL/min — ABNORMAL LOW (ref >60)
GFR, Estimated: 21 mL/min — ABNORMAL LOW (ref >60)
Glucose, Bld: 154 mg/dL — ABNORMAL HIGH (ref 70–99)
Glucose, Bld: 160 mg/dL — ABNORMAL HIGH (ref 70–99)
Glucose, Bld: 205 mg/dL — ABNORMAL HIGH (ref 70–99)
Potassium: 4.3 mmol/L (ref 3.5–5.1)
Potassium: 3.8 mmol/L (ref 3.5–5.1)
Potassium: 3.6 mmol/L (ref 3.5–5.1)
Sodium: 125 mmol/L — ABNORMAL LOW (ref 135–145)
Sodium: 128 mmol/L — ABNORMAL LOW (ref 135–145)
Sodium: 129 mmol/L — ABNORMAL LOW (ref 135–145)

## 2022-06-17 LAB — TYPE AND SCREEN
Antibody Screen: NEGATIVE
Unit division: 0

## 2022-06-17 LAB — MAGNESIUM
Magnesium: 3.2 mg/dL — ABNORMAL HIGH (ref 1.7–2.4)
Magnesium: 2.7 mg/dL — ABNORMAL HIGH (ref 1.7–2.4)

## 2022-06-17 LAB — BPAM RBC
Blood Product Expiration Date: 202405032359
ISSUE DATE / TIME: 202404230937
Unit Type and Rh: 7300

## 2022-06-17 LAB — PHOSPHORUS: Phosphorus: 7.1 mg/dL — ABNORMAL HIGH (ref 2.5–4.6)

## 2022-06-17 LAB — CBC
HCT: 21.5 % — ABNORMAL LOW (ref 36.0–46.0)
Hemoglobin: 7.1 g/dL — ABNORMAL LOW (ref 12.0–15.0)
MCH: 27.1 pg (ref 26.0–34.0)
MCHC: 33 g/dL (ref 30.0–36.0)
MCV: 82.1 fL (ref 80.0–100.0)
Platelets: 52 10*3/uL — ABNORMAL LOW (ref 150–400)
RBC: 2.62 MIL/uL — ABNORMAL LOW (ref 3.87–5.11)
RDW: 13.1 % (ref 11.5–15.5)
WBC: 12.2 10*3/uL — ABNORMAL HIGH (ref 4.0–10.5)
nRBC: 0.7 % — ABNORMAL HIGH (ref 0.0–0.2)

## 2022-06-17 LAB — GLUCOSE, CAPILLARY
Glucose-Capillary: 141 mg/dL — ABNORMAL HIGH (ref 70–99)
Glucose-Capillary: 145 mg/dL — ABNORMAL HIGH (ref 70–99)
Glucose-Capillary: 152 mg/dL — ABNORMAL HIGH (ref 70–99)
Glucose-Capillary: 166 mg/dL — ABNORMAL HIGH (ref 70–99)
Glucose-Capillary: 167 mg/dL — ABNORMAL HIGH (ref 70–99)
Glucose-Capillary: 169 mg/dL — ABNORMAL HIGH (ref 70–99)
Glucose-Capillary: 191 mg/dL — ABNORMAL HIGH (ref 70–99)

## 2022-06-17 LAB — HEPARIN LEVEL (UNFRACTIONATED): Heparin Unfractionated: 0.22 IU/mL — ABNORMAL LOW (ref 0.30–0.70)

## 2022-06-17 MED ORDER — POTASSIUM CHLORIDE CRYS ER 20 MEQ PO TBCR
20.0000 meq | EXTENDED_RELEASE_TABLET | ORAL | Status: AC
  Administered 2022-06-17 (×2): 20 meq via ORAL
  Filled 2022-06-17 (×2): qty 1

## 2022-06-17 NOTE — Inpatient Diabetes Management (Signed)
Inpatient Diabetes Program Recommendations  AACE/ADA: New Consensus Statement on Inpatient Glycemic Control (2015)  Target Ranges:  Prepandial:   less than 140 mg/dL      Peak postprandial:   less than 180 mg/dL (1-2 hours)      Critically ill patients:  140 - 180 mg/dL   Lab Results  Component Value Date   GLUCAP 145 (H) 06/17/2022   HGBA1C 9.9 (H) 06/10/2022    Latest Reference Range & Units 06/16/22 16:22 06/16/22 19:51 06/17/22 00:49 06/17/22 03:46 06/17/22 07:37  Glucose-Capillary 70 - 99 mg/dL 161 (H) 096 (H) 045 (H) 141 (H) 145 (H)  (H): Data is abnormally high  Diabetes history: Type 2 Dm Outpatient Diabetes medications: Humulin 70/30 40 units QHS, 60 units QD, Januvia 100 mg QD Current orders for Inpatient glycemic control: Levemir 8 units BID, Novolog 0-24 units Q4H  Inpatient Diabetes Program Recommendations:   Patient is now eating. Please consider: -Change Novolog correction to 0-9 units tid, 0-5 units hs  Thank you, Darel Hong E. Chenell Lozon, RN, MSN, CDE  Diabetes Coordinator Inpatient Glycemic Control Team Team Pager [phone] (8am-5pm) 06/17/2022 11:01 AM

## 2022-06-17 NOTE — Consult Note (Signed)
NAME:  Chelsea English, MRN:  [id number], DOB:  11-Jul-1960, LOS: 3 ADMISSION DATE:  06/14/2022, CONSULTATION DATE:  4/23 REFERRING MD:  Dr. Leafy Ro CHIEF COMPLAINT: Severe aortic stenosis  History of Present Illness:   Chelsea English is a 62 y/o person livning with a history of severe aortic stenosis, HTN, CKD stg 3b, T2DM who presented for elective aortic valve replacement by Dr. Leafy Ro, admitted to the ICU for continued care and ventilatory management    Pertinent  Medical History  Severe aortic stenosis HFpEF CKD3b T2DM HTN  Significant Hospital Events: Including procedures, antibiotic start and stop dates in addition to other pertinent events   4/23 mechanical aortic valve replacement, transferred to CICU 4/23 extubated  Interim History / Subjective:   Feels as though her swelling has decreased overnight. Denies significant chest discomfort or dyspnea. Converted to NSR.   Objective   Blood pressure (!) 131/56, pulse 86, temperature 98.3 F (36.8 C), temperature source Oral, resp. rate 14, height 5\' 2"  (1.575 m), weight 96.2 kg, SpO2 94 %.    Vent Mode: PSV;BIPAP FiO2 (%):  [50 %] 50 % Set Rate:  [12 bmp] 12 bmp PEEP:  [5 cmH20] 5 cmH20 Pressure Support:  [7 cmH20] 7 cmH20   Intake/Output Summary (Last 24 hours) at 06/17/2022 0713 Last data filed at 06/17/2022 0700 Gross per 24 hour  Intake 715.39 ml  Output 2695 ml  Net -1979.61 ml    Filed Weights   06/15/22 0500 06/16/22 0500 06/17/22 0548  Weight: 96.9 kg 92.6 kg 96.2 kg   Examination: General: obese HENT: nasal cannula in place Lungs: decreased breath sounds at bases. Normal work of breathing on nasal cannula Cardiovascular: regular rate and rhythm. No murmurs gallops rubs. Sternotomy bandage, drain in place Skin: warm extremities MSK: trace ede,a Neuro: alert and oriented x 3 GU: foley in place  Resolved Hospital Problem list   N/a  Assessment & Plan:   Severe aortic stenosis S/p mechanical aortic  valve replacement 4/23 Post op day 3, no acute events overnight. Leukocytosis improving. Hgb stable.  - management per Dr. Leafy Ro and team - anticoagulation per primary  -  avoid av nodal blockers per EP  AKI on CKDIIIb Hyperkalemia Hyponatremia, likely hypotonic Cr seems to have peaked, decreased from 3.07>2.79. Sodium studies consistent with CHF etiology, likely in setting of recent procedure. Has responded well to IV lasix. Electrolytes stable -continue IV diuretics can decrease this afternoon if continues to have adequate urinary output -repeat BMP this afternoon -potassium stabilized s/p lokelma 4/25 -renal US with medically renal disease, no evidence of obstructive etiology  1st degree AV block Today appears to have 1st degree block and in sinus rhythm.  -appreciate EP assistance -continue to monitor, pacer settings per primary/EP   Postoperative vent management Supplemental oxygen stable since diuresing.  -wean oxygen as able  Normocytic anemia Thrombocytopenia Chronic secondary to renal failure, likely exacerbated by valvular repair. Hgb stable, heparin held. -trend daily -limit blood draws as able  T2DM Last A1c of 9.9% -manage glucose with basal/bolus dosing  HTN Hold in setting of recent aortic valve repair  Constipation Continue stool softeners  Best Practice (right click and "Reselect all SmartList Selections" daily)   Diet/type: regular DVT prophylaxis: per primary GI prophylaxis: protonix Foley:  Yes, and it is still needed Code Status:  full code  Labs   CBC: Recent Labs  Lab 06/15/22 0405 06/15/22 1642 06/16/22 0325 06/16/22 1253 06/17/22 0049  WBC 17.8* 18.0* 15.8* 15.9* 12.2*  HGB 8.2* 8.2* 7.9* 7.4* 7.1*  HCT 25.7* 24.5* 23.6* 22.0* 21.5*  MCV 85.1 84.2 82.8 83.3 82.1  PLT 119* 97* 85* 73* 52*     Basic Metabolic Panel: Recent Labs  Lab 06/14/22 1816 06/14/22 2116 06/15/22 0405 06/15/22 1335 06/15/22 1642 06/16/22 0325  06/16/22 1639 06/17/22 0049  NA 134*   < > 130* 130*  --  126* 127* 125*  K 5.4*   < > 5.9* 5.2*  --  5.9* 5.2* 4.3  CL 103  --  100 98  --  94* 93* 91*  CO2 23  --  22 23  --  21* 21* 21*  GLUCOSE 128*  --  121* 145*  --  211* 176* 154*  BUN 45*  --  46* 49*  --  61* 71* 73*  CREATININE 1.56*  --  1.86* 2.32*  --  2.80* 3.07* 2.79*  CALCIUM 8.0*  --  7.8* 8.0*  --  8.0* 8.2* 8.0*  MG 4.0*  --  3.6*  --  3.3*  --   --  3.2*  PHOS  --   --   --   --   --   --   --  7.1*   < > = values in this interval not displayed.    GFR: Estimated Creatinine Clearance: 22.6 mL/min (A) (by C-G formula based on SCr of 2.79 mg/dL (H)). Recent Labs  Lab 06/15/22 1642 06/16/22 0325 06/16/22 1253 06/17/22 0049  WBC 18.0* 15.8* 15.9* 12.2*     Liver Function Tests: Recent Labs  Lab 06/10/22 1355  AST 32  ALT 38  ALKPHOS 162*  BILITOT 0.4  PROT 7.6  ALBUMIN 3.5    No results for input(s): "LIPASE", "AMYLASE" in the last 168 hours. No results for input(s): "AMMONIA" in the last 168 hours.  ABG    Component Value Date/Time   PHART 7.299 (L) 06/14/2022 2116   PCO2ART 47.8 06/14/2022 2116   PO2ART 110 (H) 06/14/2022 2116   HCO3 23.5 06/14/2022 2116   TCO2 25 06/14/2022 2116   ACIDBASEDEF 3.0 (H) 06/14/2022 2116   O2SAT 98 06/14/2022 2116     Coagulation Profile: Recent Labs  Lab 06/10/22 1355 06/14/22 1156  INR 1.0 1.5*     Cardiac Enzymes: No results for input(s): "CKTOTAL", "CKMB", "CKMBINDEX", "TROPONINI" in the last 168 hours.  HbA1C: Hgb A1c MFr Bld  Date/Time Value Ref Range Status  06/10/2022 01:55 PM 9.9 (H) 4.8 - 5.6 % Final    Comment:    (NOTE) Pre diabetes:          5.7%-6.4%  Diabetes:              >6.4%  Glycemic control for   <7.0% adults with diabetes   04/15/2022 10:07 PM 6.8 (H) 4.8 - 5.6 % Final    Comment:    (NOTE) Pre diabetes:          5.7%-6.4%  Diabetes:              >6.4%  Glycemic control for   <7.0% adults with diabetes      CBG: Recent Labs  Lab 06/16/22 1119 06/16/22 1622 06/16/22 1951 06/17/22 0049 06/17/22 0346  GLUCAP 182* 186* 156* 152* 141*     Review of Systems:   Chest pain Extremity swelling  Past Medical History:  She,  has a past medical history of Anemia, Aortic stenosis, Coronary artery disease, Diabetes mellitus without complication (HCC), Heart murmur, Hyperlipidemia, Hypertension,  and Oxygen dependent.   Surgical History:   Past Surgical History:  Procedure Laterality Date   AORTIC VALVE REPLACEMENT N/A 06/14/2022   Procedure: AORTIC VALVE REPLACEMENT (AVR) USING SJM REGENT MECHANICAL HEART VALVE SIZE ;  Surgeon: Eugenio Hoes, MD;  Location: [facility] OR;  Service: Open Heart Surgery;  Laterality: N/A;   ARTERY BIOPSY Right 07/20/2021   Procedure: BIOPSY OF RIGHT TEMPORAL ARTERY;  Surgeon: Chuck Hint, MD;  Location: [facility] OR;  Service: Vascular;  Laterality: Right;   cataract Right    HYSTEROSCOPY WITH D & C     removal of polyp   MULTIPLE TOOTH EXTRACTIONS     no teeth, no dentures   RIGHT AND LEFT HEART CATH N/A 04/19/2022   Procedure: RIGHT AND LEFT HEART CATH;  Surgeon: [name], MD;  Location: [facility] INVASIVE CV LAB;  Service: Cardiovascular;  Laterality: N/A;   TEE WITHOUT CARDIOVERSION N/A 06/14/2022   Procedure: TRANSESOPHAGEAL ECHOCARDIOGRAM;  Surgeon: Eugenio Hoes, MD;  Location: [facility] OR;  Service: Open Heart Surgery;  Laterality: N/A;     Social History:   reports that she has never smoked. She has never used smokeless tobacco. She reports that she does not drink alcohol and does not use drugs.   Family History:  Her family history is not on file.   Allergies Allergies  Allergen Reactions   Penicillins Anaphylaxis     Home Medications  Prior to Admission medications   Medication Sig Start Date End Date Taking? Authorizing Provider  amLODipine (NORVASC) 5 MG tablet Take 5 mg by mouth daily. 04/28/22  Yes [provider]  aspirin 81 MG  chewable tablet Chew 81 mg by mouth daily. 07/06/18  Yes [provider]  atorvastatin (LIPITOR) 40 MG tablet Take 40 mg by mouth at bedtime. 06/01/21  Yes [provider]  BD INSULIN SYRINGE U/F 31G X 5/16" 1 ML MISC Inject 1 each into the skin 3 (three) times daily. Use to inject unsulin 02/07/22  Yes [provider]  benzonatate (TESSALON) 100 MG capsule Take 100 mg by mouth 2 (two) times daily as needed for cough. 07/12/21  Yes [provider]  carvedilol (COREG) 3.125 MG tablet Take 1 tablet (3.125 mg total) by mouth 2 (two) times daily with a meal. 05/19/22  Yes Orbie Pyo, MD  cyclobenzaprine (FLEXERIL) 5 MG tablet Take 10 mg by mouth 2 (two) times daily as needed for muscle spasms. 07/06/21  Yes [provider]  ferrous sulfate 325 (65 FE) MG tablet Take 650 mg by mouth daily with breakfast.   Yes [provider]  furosemide (LASIX) 40 MG tablet Take 40 mg by mouth daily. 05/18/22  Yes [provider]  gabapentin (NEURONTIN) 100 MG capsule Take 100 mg by mouth daily. 07/06/21  Yes [provider]  insulin NPH-regular Human (HUMULIN 70/30) (70-30) 100 UNIT/ML injection Inject 40-60 Units into the skin See admin instructions. Injecting 60 units in the morning and 40 units at bedtime. 10/20/17  Yes [provider]  JANUVIA 100 MG tablet Take 100 mg by mouth daily. 07/05/21  Yes [provider]  Multiple Vitamins-Minerals (MULTI FOR HER 50+ PO) Take 1 tablet by mouth daily.   Yes [provider]  polyethylene glycol (MIRALAX / GLYCOLAX) 17 g packet Take 17 g by mouth daily as needed for moderate constipation.   Yes [provider]    Thalia Bloodgood DO  Internal Medicine Resident PGY-3 [place]  Pager: [phone]

## 2022-06-17 NOTE — Progress Notes (Deleted)
[address]       [name] [address]             [phone]   HPI: This is a 62 year old female who was found to have severe aortic stenosis. She underwent an aortic valve replacement with a 21mm [employer] Mechanical prosthesis.  On 06/14/2022 by Dr. Leafy Ro. She was discharged on 06/16/2022.  Patient presents today for one week post op follow up.  Facility-Administered Medications Ordered in Other Visits  Medication Dose Route Frequency Provider Last Rate Last Admin   0.9 %  sodium chloride infusion  250 mL Intravenous Continuous Gold, Wayne E, PA-C       acetaminophen (TYLENOL) tablet 1,000 mg  1,000 mg Oral Q6H Gold, Wayne E, PA-C   1,000 mg at 06/17/22 1147   Or   acetaminophen (TYLENOL) 160 MG/5ML solution 1,000 mg  1,000 mg Per Tube Q6H Gold, Wayne E, PA-C       aspirin EC tablet 81 mg  81 mg Oral Daily Eugenio Hoes, MD   81 mg at 06/17/22 9147   atorvastatin (LIPITOR) tablet 40 mg  40 mg Oral QHS Gold, Wayne E, PA-C   40 mg at 06/16/22 2125   bisacodyl (DULCOLAX) EC tablet 10 mg  10 mg Oral Daily Gold, Deniece Portela E, PA-C   10 mg at 06/17/22 8295   Or   bisacodyl (DULCOLAX) suppository 10 mg  10 mg Rectal Daily Gold, Wayne E, PA-C       Chlorhexidine Gluconate Cloth 2 % PADS 6 each  6 each Topical Daily Eugenio Hoes, MD   6 each at 06/17/22 0927   dextrose 50 % solution 0-50 mL  0-50 mL Intravenous PRN Gershon Crane E, PA-C       docusate sodium (COLACE) capsule 200 mg  200 mg Oral Daily Gold, Wayne E, PA-C   200 mg at 06/17/22 6213   furosemide (LASIX) 200 mg in dextrose 5 % 100 mL (2 mg/mL) infusion  10 mg/hr Intravenous Continuous Lorin Glass, MD 5 mL/hr at 06/17/22 0936 10 mg/hr at 06/17/22 0936   insulin aspart (novoLOG) injection 0-24 Units  0-24 Units Subcutaneous Q4H Eugenio Hoes, MD   4 Units at 06/17/22 1147   insulin detemir (LEVEMIR) injection 8 Units  8 Units Subcutaneous Q12H Eugenio Hoes, MD   8 Units at 06/17/22 0865   metoprolol tartrate  (LOPRESSOR) injection 2.5-5 mg  2.5-5 mg Intravenous Q2H PRN Gershon Crane E, PA-C   5 mg at 06/14/22 2257   ondansetron (ZOFRAN) injection 4 mg  4 mg Intravenous Q6H PRN Rowe Clack, PA-C       Oral care mouth rinse  15 mL Mouth Rinse 4 times per day Eugenio Hoes, MD   15 mL at 06/17/22 0830   Oral care mouth rinse  15 mL Mouth Rinse PRN Eugenio Hoes, MD       oxyCODONE (Oxy IR/ROXICODONE) immediate release tablet 5-10 mg  5-10 mg Oral Q3H PRN Gershon Crane E, PA-C   10 mg at 06/15/22 1328   pantoprazole (PROTONIX) EC tablet 40 mg  40 mg Oral Daily Gold, Wayne E, PA-C   40 mg at 06/17/22 0926   phenol (CHLORASEPTIC) mouth spray 1 spray  1 spray Mouth/Throat PRN Lorin Glass, MD       sodium chloride flush (NS) 0.9 % injection 10-40 mL  10-40 mL Intracatheter Q12H Eugenio Hoes, MD   10 mL at 06/17/22 [phone]  sodium chloride flush (NS) 0.9 % injection 10-40 mL  10-40 mL Intracatheter PRN Eugenio Hoes, MD       sodium chloride flush (NS) 0.9 % injection 3 mL  3 mL Intravenous Q12H Gold, Wayne E, PA-C   3 mL at 06/17/22 1191   sodium chloride flush (NS) 0.9 % injection 3 mL  3 mL Intravenous PRN Gold, Wayne E, PA-C       traMADol Janean Sark) tablet 50-100 mg  50-100 mg Oral Q4H PRN Gershon Crane E, PA-C   50 mg at 06/16/22 2028  Vital Signs:  Physical Exam: CV Pulmonary Abdomen Extremities Wounds  Diagnostic Tests: ***  Impression and Plan: ***     Ardelle Balls, PA-C [facility] [phone]

## 2022-06-17 NOTE — Progress Notes (Signed)
[address]       [place] [address]             [phone]      3 Days Post-Op  Procedure(s) (LRB): AORTIC VALVE REPLACEMENT (AVR) USING SJM REGENT MECHANICAL HEART VALVE SIZE (N/A) TRANSESOPHAGEAL ECHOCARDIOGRAM (N/A)  Total Length of Stay:  LOS: 3 days   SUBJECTIVE: Ambulated this am Rhythm returned to NSR this am  Vitals:   06/17/22 0634 06/17/22 0700  BP:  (!) 131/56  Pulse:    Resp: 13 14  Temp:    SpO2: 95% 94%    Intake/Output      04/25 0701 04/26 0700 04/26 0701 04/27 0700   P.O. 480    I.V. (mL/kg) 235.4 (2.4)    IV Piggyback     Total Intake(mL/kg) 715.4 (7.4)    Urine (mL/kg/hr) 2695 (1.2)    Chest Tube     Total Output 2695    Net -1979.6             sodium chloride     furosemide (LASIX) 200 mg in dextrose 5 % 100 mL (2 mg/mL) infusion 10 mg/hr (06/17/22 0700)   phenylephrine (NEO-SYNEPHRINE) Adult infusion Stopped (06/14/22 1856)    CBC    Component Value Date/Time   WBC 12.2 (H) 06/17/2022 0049   RBC 2.62 (L) 06/17/2022 0049   HGB 7.1 (L) 06/17/2022 0049   HCT 21.5 (L) 06/17/2022 0049   PLT 52 (L) 06/17/2022 0049   MCV 82.1 06/17/2022 0049   MCH 27.1 06/17/2022 0049   MCHC 33.0 06/17/2022 0049   RDW 13.1 06/17/2022 0049   LYMPHSABS 2.0 04/16/2022 0056   MONOABS 0.6 04/16/2022 0056   EOSABS 0.1 04/16/2022 0056   BASOSABS 0.0 04/16/2022 0056   CMP     Component Value Date/Time   NA 125 (L) 06/17/2022 0049   K 4.3 06/17/2022 0049   CL 91 (L) 06/17/2022 0049   CO2 21 (L) 06/17/2022 0049   GLUCOSE 154 (H) 06/17/2022 0049   BUN 73 (H) 06/17/2022 0049   CREATININE 2.79 (H) 06/17/2022 0049   CALCIUM 8.0 (L) 06/17/2022 0049   PROT 7.6 06/10/2022 1355   ALBUMIN 3.5 06/10/2022 1355   AST 32 06/10/2022 1355   ALT 38 06/10/2022 1355   ALKPHOS 162 (H) 06/10/2022 1355   BILITOT 0.4 06/10/2022 1355   GFRNONAA 19 (L) 06/17/2022 0049   ABG    Component Value Date/Time   PHART 7.299 (L) 06/14/2022  2116   PCO2ART 47.8 06/14/2022 2116   PO2ART 110 (H) 06/14/2022 2116   HCO3 23.5 06/14/2022 2116   TCO2 25 06/14/2022 2116   ACIDBASEDEF 3.0 (H) 06/14/2022 2116   O2SAT 98 06/14/2022 2116   CBG (last 3)  Recent Labs    06/16/22 1951 06/17/22 0049 06/17/22 0346  GLUCAP 156* 152* 141*  EXAM; Lungs; decreased at bases Card: rr with sharp valve sound Ext: warm Neuro: awake. No focal deficits   ASSESSMENT: POD #3 SP Mechanical AVR Hemodynamics better now that in NSR. Will hold on bb for today and trial small dose tomorrow Leave Pws in for now with low plts Have stopped heparin due to low plts. Will follow for now Renal: good response to lasix drip. Will continue today. Na low but stable. Cr appears to have plataued.  Pulm: now on 4 l Alpha. Continue ambulation and diuresis Heme: blood loss anemia. Will follow for now but if Hbg goes lower  will transfuse Leave in ICU today   Eugenio Hoes, MD 06/17/2022

## 2022-06-18 LAB — CALCIUM / CREATININE RATIO, URINE
Calcium, Ur: 1.6 mg/dL
Calcium/Creat.Ratio: 17 mg/g creat — ABNORMAL LOW (ref 29–442)
Creatinine, Urine: 92.3 mg/dL

## 2022-06-18 LAB — BASIC METABOLIC PANEL
Anion gap: 10 (ref 5–15)
Anion gap: 12 (ref 5–15)
BUN: 76 mg/dL — ABNORMAL HIGH (ref 8–23)
BUN: 73 mg/dL — ABNORMAL HIGH (ref 8–23)
CO2: 26 mmol/L (ref 22–32)
CO2: 26 mmol/L (ref 22–32)
Calcium: 8.1 mg/dL — ABNORMAL LOW (ref 8.9–10.3)
Calcium: 8.4 mg/dL — ABNORMAL LOW (ref 8.9–10.3)
Chloride: 95 mmol/L — ABNORMAL LOW (ref 98–111)
Chloride: 93 mmol/L — ABNORMAL LOW (ref 98–111)
Creatinine, Ser: 2.19 mg/dL — ABNORMAL HIGH (ref 0.44–1.00)
Creatinine, Ser: 2.05 mg/dL — ABNORMAL HIGH (ref 0.44–1.00)
GFR, Estimated: 25 mL/min — ABNORMAL LOW (ref >60)
GFR, Estimated: 27 mL/min — ABNORMAL LOW (ref >60)
Glucose, Bld: 162 mg/dL — ABNORMAL HIGH (ref 70–99)
Glucose, Bld: 222 mg/dL — ABNORMAL HIGH (ref 70–99)
Potassium: 3.4 mmol/L — ABNORMAL LOW (ref 3.5–5.1)
Potassium: 3.3 mmol/L — ABNORMAL LOW (ref 3.5–5.1)
Sodium: 131 mmol/L — ABNORMAL LOW (ref 135–145)
Sodium: 131 mmol/L — ABNORMAL LOW (ref 135–145)

## 2022-06-18 LAB — GLUCOSE, CAPILLARY
Glucose-Capillary: 144 mg/dL — ABNORMAL HIGH (ref 70–99)
Glucose-Capillary: 158 mg/dL — ABNORMAL HIGH (ref 70–99)
Glucose-Capillary: 219 mg/dL — ABNORMAL HIGH (ref 70–99)
Glucose-Capillary: 257 mg/dL — ABNORMAL HIGH (ref 70–99)
Glucose-Capillary: 229 mg/dL — ABNORMAL HIGH (ref 70–99)
Glucose-Capillary: 190 mg/dL — ABNORMAL HIGH (ref 70–99)

## 2022-06-18 LAB — CBC
HCT: 21.8 % — ABNORMAL LOW (ref 36.0–46.0)
Hemoglobin: 7.3 g/dL — ABNORMAL LOW (ref 12.0–15.0)
MCH: 27.8 pg (ref 26.0–34.0)
MCHC: 33.5 g/dL (ref 30.0–36.0)
MCV: 82.9 fL (ref 80.0–100.0)
Platelets: 43 10*3/uL — ABNORMAL LOW (ref 150–400)
RBC: 2.63 MIL/uL — ABNORMAL LOW (ref 3.87–5.11)
RDW: 12.8 % (ref 11.5–15.5)
WBC: 11 10*3/uL — ABNORMAL HIGH (ref 4.0–10.5)
nRBC: 1.4 % — ABNORMAL HIGH (ref 0.0–0.2)

## 2022-06-18 LAB — MAGNESIUM: Magnesium: 2.7 mg/dL — ABNORMAL HIGH (ref 1.7–2.4)

## 2022-06-18 LAB — PHOSPHORUS: Phosphorus: 4.9 mg/dL — ABNORMAL HIGH (ref 2.5–4.6)

## 2022-06-18 MED ORDER — POTASSIUM CHLORIDE CRYS ER 20 MEQ PO TBCR
40.0000 meq | EXTENDED_RELEASE_TABLET | Freq: Once | ORAL | Status: DC
  Filled 2022-06-18: qty 2

## 2022-06-18 MED ORDER — FUROSEMIDE 10 MG/ML IJ SOLN
60.0000 mg | Freq: Two times a day (BID) | INTRAMUSCULAR | Status: DC
  Administered 2022-06-18 – 2022-06-19 (×3): 60 mg via INTRAVENOUS
  Filled 2022-06-18 (×3): qty 6

## 2022-06-18 MED ORDER — FUROSEMIDE 10 MG/ML IJ SOLN
60.0000 mg | Freq: Two times a day (BID) | INTRAMUSCULAR | Status: DC
  Administered 2022-06-18: 60 mg via INTRAVENOUS
  Filled 2022-06-18: qty 6

## 2022-06-18 MED ORDER — POTASSIUM CHLORIDE CRYS ER 20 MEQ PO TBCR
40.0000 meq | EXTENDED_RELEASE_TABLET | Freq: Once | ORAL | Status: AC
  Administered 2022-06-18: 40 meq via ORAL
  Filled 2022-06-18: qty 2

## 2022-06-18 MED ORDER — POTASSIUM CHLORIDE CRYS ER 20 MEQ PO TBCR
20.0000 meq | EXTENDED_RELEASE_TABLET | ORAL | Status: AC
  Administered 2022-06-18 (×3): 20 meq via ORAL
  Filled 2022-06-18 (×3): qty 1

## 2022-06-18 NOTE — Progress Notes (Signed)
Right IJ introducer removed per order. Manual pressure held for 10 minutes. Site is a level 0. Patient remained hemodynamically stable throughout pull.

## 2022-06-18 NOTE — Progress Notes (Signed)
      [address]       [place] [address]             [phone]      4 Days Post-Op  Procedure(s) (LRB): AORTIC VALVE REPLACEMENT (AVR) USING SJM [facility] MECHANICAL HEART VALVE SIZE (N/A) TRANSESOPHAGEAL ECHOCARDIOGRAM (N/A)  Total Length of Stay:  LOS: 4 days   SUBJECTIVE: No complaints Walked the loop in unit Eating well  Vitals:   06/18/22 0600 06/18/22 0700  BP: 118/62 (!) 113/48  Pulse:    Resp: 15 13  Temp:    SpO2: 98% 96%    Intake/Output      04/26 0701 04/27 0700 04/27 0701 04/28 0700   P.O.     I.V. (mL/kg) 74.8 (0.8)    Total Intake(mL/kg) 74.8 (0.8)    Urine (mL/kg/hr) 5925 (2.7)    Total Output 5925    Net -5850.2             sodium chloride     furosemide (LASIX) 200 mg in dextrose 5 % 100 mL (2 mg/mL) infusion 5 mg/hr (06/18/22 0700)    CBC    Component Value Date/Time   WBC 11.0 (H) 06/18/2022 0114   RBC 2.63 (L) 06/18/2022 0114   HGB 7.3 (L) 06/18/2022 0114   HCT 21.8 (L) 06/18/2022 0114   PLT 43 (L) 06/18/2022 0114   MCV 82.9 06/18/2022 0114   MCH 27.8 06/18/2022 0114   MCHC 33.5 06/18/2022 0114   RDW 12.8 06/18/2022 0114   LYMPHSABS 2.0 04/16/2022 0056   MONOABS 0.6 04/16/2022 0056   EOSABS 0.1 04/16/2022 0056   BASOSABS 0.0 04/16/2022 0056   CMP     Component Value Date/Time   NA 131 (L) 06/18/2022 0114   K 3.4 (L) 06/18/2022 0114   CL 95 (L) 06/18/2022 0114   CO2 26 06/18/2022 0114   GLUCOSE 162 (H) 06/18/2022 0114   BUN 76 (H) 06/18/2022 0114   CREATININE 2.19 (H) 06/18/2022 0114   CALCIUM 8.1 (L) 06/18/2022 0114   PROT 7.6 06/10/2022 1355   ALBUMIN 3.5 06/10/2022 1355   AST 32 06/10/2022 1355   ALT 38 06/10/2022 1355   ALKPHOS 162 (H) 06/10/2022 1355   BILITOT 0.4 06/10/2022 1355   GFRNONAA 25 (L) 06/18/2022 0114   ABG    Component Value Date/Time   PHART 7.299 (L) 06/14/2022 2116   PCO2ART 47.8 06/14/2022 2116   PO2ART 110 (H) 06/14/2022 2116   HCO3 23.5 06/14/2022 2116   TCO2  25 06/14/2022 2116   ACIDBASEDEF 3.0 (H) 06/14/2022 2116   O2SAT 98 06/14/2022 2116   CBG (last 3)  Recent Labs    06/17/22 2013 06/17/22 2315 06/18/22 0358  GLUCAP 191* 169* 144*  EXAM Lungs: clear Card: RR with sharp valve sounds Ext: warm Neuro: alert   ASSESSMENT: POD #4 SP AVR Hemodynamics good. Still in NSR, will add low dose BB. Leaving PWs Renal: continues to improve. Will remove foley and go to bolus IV diuretics Pulm: On 2 liters, improving cont diuresis Heme: Hgb stable. Holding on transfusing for now. PLTS still low. Need to hold on anticoagulation and will send HIT studies Ok to transfer to floor   Eugenio Hoes, MD 06/18/2022

## 2022-06-18 NOTE — Progress Notes (Signed)
Patient arrived in the unit,CHG bath given,Vitals taken,CCMD notified ,patient oriented to the unit

## 2022-06-19 LAB — CBC
HCT: 23.7 % — ABNORMAL LOW (ref 36.0–46.0)
Hemoglobin: 7.9 g/dL — ABNORMAL LOW (ref 12.0–15.0)
MCH: 27.4 pg (ref 26.0–34.0)
MCHC: 33.3 g/dL (ref 30.0–36.0)
MCV: 82.3 fL (ref 80.0–100.0)
Platelets: 100 10*3/uL — ABNORMAL LOW (ref 150–400)
RBC: 2.88 MIL/uL — ABNORMAL LOW (ref 3.87–5.11)
RDW: 13.1 % (ref 11.5–15.5)
WBC: 14.7 10*3/uL — ABNORMAL HIGH (ref 4.0–10.5)
nRBC: 0.7 % — ABNORMAL HIGH (ref 0.0–0.2)

## 2022-06-19 LAB — GLUCOSE, CAPILLARY
Glucose-Capillary: 137 mg/dL — ABNORMAL HIGH (ref 70–99)
Glucose-Capillary: 191 mg/dL — ABNORMAL HIGH (ref 70–99)
Glucose-Capillary: 193 mg/dL — ABNORMAL HIGH (ref 70–99)
Glucose-Capillary: 202 mg/dL — ABNORMAL HIGH (ref 70–99)
Glucose-Capillary: 212 mg/dL — ABNORMAL HIGH (ref 70–99)
Glucose-Capillary: 249 mg/dL — ABNORMAL HIGH (ref 70–99)
Glucose-Capillary: 306 mg/dL — ABNORMAL HIGH (ref 70–99)

## 2022-06-19 LAB — PHOSPHORUS: Phosphorus: 3.2 mg/dL (ref 2.5–4.6)

## 2022-06-19 LAB — MAGNESIUM: Magnesium: 2.6 mg/dL — ABNORMAL HIGH (ref 1.7–2.4)

## 2022-06-19 MED ORDER — POTASSIUM CHLORIDE CRYS ER 20 MEQ PO TBCR
40.0000 meq | EXTENDED_RELEASE_TABLET | Freq: Two times a day (BID) | ORAL | Status: AC
  Administered 2022-06-19 (×2): 40 meq via ORAL
  Filled 2022-06-19 (×2): qty 2

## 2022-06-19 MED ORDER — WARFARIN SODIUM 5 MG PO TABS
5.0000 mg | ORAL_TABLET | Freq: Every day | ORAL | Status: DC
  Administered 2022-06-19 – 2022-06-20 (×2): 5 mg via ORAL
  Filled 2022-06-19 (×2): qty 1

## 2022-06-19 MED ORDER — LACTULOSE 10 GM/15ML PO SOLN
20.0000 g | Freq: Once | ORAL | Status: AC
  Administered 2022-06-19: 20 g via ORAL
  Filled 2022-06-19: qty 30

## 2022-06-19 MED ORDER — WARFARIN - PHYSICIAN DOSING INPATIENT: Freq: Every day | Status: DC

## 2022-06-19 MED ORDER — INSULIN DETEMIR 100 UNIT/ML ~~LOC~~ SOLN
12.0000 [IU] | Freq: Two times a day (BID) | SUBCUTANEOUS | Status: DC
  Administered 2022-06-19: 12 [IU] via SUBCUTANEOUS
  Filled 2022-06-19 (×3): qty 0.12

## 2022-06-19 NOTE — Progress Notes (Signed)
Bipap not indicated at this time. 

## 2022-06-19 NOTE — Progress Notes (Signed)
[address]       [place] [address]             [phone]      5 Days Post-Op Procedure(s) (LRB): AORTIC VALVE REPLACEMENT (AVR) USING SJM [facility] MECHANICAL HEART VALVE SIZE (N/A) TRANSESOPHAGEAL ECHOCARDIOGRAM (N/A) Subjective: Awake and alert, resting in bed. Patient's daughter assisted with language translation.  C/O upper abdominal "tightness" and bloating.  Ate full breakfast, no BM since surgery.  Otherwise doing OK with no other concerns.   Objective: Vital signs in last 24 hours: Temp:  [98.3 F (36.8 C)-98.7 F (37.1 C)] 98.4 F (36.9 C) (04/28 0936) Pulse Rate:  [83-84] 84 (04/28 0936) Cardiac Rhythm: Heart block (04/28 0759) Resp:  [13-17] 14 (04/28 0936) BP: (128-144)/(54-76) 130/76 (04/28 0936) SpO2:  [87 %-100 %] 98 % (04/28 0936) Weight:  [92.2 kg] 92.2 kg (04/28 0500)    Intake/Output from previous day: 04/27 0701 - 04/28 0700 In: 3.3 [I.V.:3.3] Out: 2105 [Urine:2105] Intake/Output this shift: No intake/output data recorded.  General appearance: alert, cooperative, and no distress Neurologic: intact Heart: RRR, monitor showing SR with first degree AVB and PAC's Lungs: breath sounds coarse, shallow.  Abdomen: firm, mild distension. No abe tenderness.  Extremities: mild peripheral edema.  Wound: the sternotomy incision is well approximated and dry.    Lab Results: Recent Labs    06/18/22 0114 06/19/22 0108  WBC 11.0* 14.7*  HGB 7.3* 7.9*  HCT 21.8* 23.7*  PLT 43* 100*   BMET:  Recent Labs    06/18/22 0114 06/18/22 0935  NA 131* 131*  K 3.4* 3.3*  CL 95* 93*  CO2 26 26  GLUCOSE 162* 222*  BUN 76* 73*  CREATININE 2.19* 2.05*  CALCIUM 8.1* 8.4*    PT/INR: No results for input(s): "LABPROT", "INR" in the last 72 hours. ABG    Component Value Date/Time   PHART 7.299 (L) 06/14/2022 2116   HCO3 23.5 06/14/2022 2116   TCO2 25 06/14/2022 2116   ACIDBASEDEF 3.0 (H) 06/14/2022 2116   O2SAT 98 06/14/2022  2116   CBG (last 3)  Recent Labs    06/19/22 0104 06/19/22 0559 06/19/22 0857  GLUCAP 191* 137* 306*    Assessment/Plan: S/P Procedure(s) (LRB): AORTIC VALVE REPLACEMENT (AVR) USING SJM [facility] MECHANICAL HEART VALVE SIZE (N/A) TRANSESOPHAGEAL ECHOCARDIOGRAM (N/A)  -POD5 mechanical AVR for aortic stenosis. Stable BP and cardiac rhythm.  Starting Coumadin at 5mg  today since platelet count is recovering. Continue low-dose metoprolol, Lipitor, ASA. Remove pacer wires.   RENAL-CKD stage IIIb- Creat peaked at 3.07 and is trending down. UO reasonable. Continue to monitor.   -GI- tolerating PO's but having some abd discomfort this morning. Exam benign. No BM since surgery so will give lactulose today. Continue PPI.  -Volume Excess- Wt still about 3Lbs above pre-op. Continue Lasix 60mg  IV BID for another day.  -Type 2 DM- on Januvia and NPH 70/30 insulin prior to admission. Glucose is 150's to 300 post 24 hours. Increasing the Levemir to 12 units q12 hours, continue SSI  - Hypokelemia- replacement ordered. Re-check in AM.   -PULM- breath sounds shallow but clear.  Oxygenating well on 2Lnc O2. Encouraging IS and flutter valve. Follow up CXR in AM.   -Disposition- planning for eventual discharge to home when INR approaching therapeutic. Will request PT / OT evals to determine if there are any discharge needs for therapy or equipment.    LOS: 5 days  Leary Roca, PA-C [phone] 06/19/2022

## 2022-06-19 NOTE — Progress Notes (Signed)
Epicardial pacing wire removed per order,patient tolerated the procedure well  ,vitals checked, patient educate to be in bed rest for 1 hour

## 2022-06-19 NOTE — Evaluation (Signed)
Physical Therapy Evaluation Patient Details Name: Chelsea English MRN: [id number] DOB: [birth year] Today's Date: 06/19/2022  History of Present Illness  62 y/o person who presented 06/14/22 for elective aortic valve replacement. S/p median sternotomy; post-op 2nd degree heart block, respiratory failure and AKI; pt converted to NSR; PMH- HTN, CKD stg 3b, T2DM , HF  Clinical Impression   Patient is s/p above surgery resulting in functional limitations due to the deficits listed below (see PT Problem List). Patient lives with daughters and was independent with ambulation without a device PTA. Her bedroom and shower are on the second floor. Currently pt required min assist for ambulation without a device, and may benefit from use of RW with light UE support for balance. Daughters expressed concern over pt's ability to climb to second floor and report that she puts a lot of weight through her hand on the rail. They want to avoid rehab if at all possible. Will need to focus on stair training to prepare for 1 flight up to her bedroom.  Patient will benefit from acute skilled PT to increase their independence and safety with mobility to facilitate discharge.        Recommendations for follow up therapy are one component of a multi-disciplinary discharge planning process, led by the attending physician.  Recommendations may be updated based on patient status, additional functional criteria and insurance authorization.  Follow Up Recommendations       Assistance Recommended at Discharge Intermittent Supervision/Assistance  Patient can return home with the following  Help with stairs or ramp for entrance;A little help with walking and/or transfers;A lot of help with bathing/dressing/bathroom;Assistance with cooking/housework    Equipment Recommendations Rolling walker (2 wheels) (pt has rollator but needs RW for second floor)  Recommendations for Other Services  OT consult    Functional Status  Assessment Patient has had a recent decline in their functional status and demonstrates the ability to make significant improvements in function in a reasonable and predictable amount of time.     Precautions / Restrictions Precautions Precautions: Sternal;Fall Precaution Booklet Issued: No Required Braces or Orthoses:  (pt using heart pillow to brace sternum) Restrictions Other Position/Activity Restrictions: sternal precautions      Mobility  Bed Mobility Overal bed mobility: Needs Assistance Bed Mobility: Rolling, Sidelying to Sit, Sit to Sidelying Rolling: Min assist Sidelying to sit: Mod assist, HOB elevated     Sit to sidelying: Min assist General bed mobility comments: pt required instructional cues to roll and then side to sit (and reverse as returned to bed); assist to raise torso; no assist needed to raise legs onto bed    Transfers Overall transfer level: Needs assistance Equipment used: None Transfers: Sit to/from Stand Sit to Stand: Min assist           General transfer comment: slight posterior imbalance upon standing    Ambulation/Gait Ambulation/Gait assistance: Min assist, +2 safety/equipment Gait Distance (Feet): 110 Feet Assistive device: None (pt holding heart pillow) Gait Pattern/deviations: Step-through pattern, Wide base of support   Gait velocity interpretation: 1.31 - 2.62 ft/sec, indicative of limited community ambulator   General Gait Details: one instance of stagger step posteriorly with min assist to recover; incr lateral sway (?pt's baseline)  Stairs            Wheelchair Mobility    Modified Rankin (Stroke Patients Only)       Balance Overall balance assessment: Mild deficits observed, not formally tested  Pertinent Vitals/Pain Pain Assessment Pain Assessment: Faces Faces Pain Scale: Hurts little more Pain Location: sternum Pain Descriptors / Indicators:  Grimacing, Guarding Pain Intervention(s): Limited activity within patient's tolerance, Monitored during session    Home Living Family/patient expects to be discharged to:: Private residence Living Arrangements: Children Available Help at Discharge: Family;Available 24 hours/day (2 dtrs work nights, 1 dtr works days) Type of Home: House Home Access: Level entry     Alternate Level Stairs-Number of Steps: flight Home Layout: Two level;Bed/bath upstairs Home Equipment: BSC/3in1;Rollator (4 wheels)      Prior Function Prior Level of Function : Independent/Modified Independent             Mobility Comments: ind without AD       Hand Dominance   Dominant Hand: Right    Extremity/Trunk Assessment   Upper Extremity Assessment Upper Extremity Assessment: Defer to OT evaluation    Lower Extremity Assessment Lower Extremity Assessment: Generalized weakness    Cervical / Trunk Assessment Cervical / Trunk Assessment: Other exceptions Cervical / Trunk Exceptions: overweight  Communication   Communication: Prefers language other than English;Interpreter utilized (daughter present to interpret)  Cognition Arousal/Alertness: Awake/alert Behavior During Therapy: WFL for tasks assessed/performed                                   General Comments: cognition NT; pt not very vocal        General Comments General comments (skin integrity, edema, etc.): 2 daughters present    Exercises     Assessment/Plan    PT Assessment Patient needs continued PT services  PT Problem List Decreased strength;Decreased activity tolerance;Decreased balance;Decreased mobility;Decreased knowledge of use of DME;Decreased knowledge of precautions;Obesity;Pain       PT Treatment Interventions DME instruction;Gait training;Stair training;Functional mobility training;Therapeutic activities;Therapeutic exercise;Balance training;Patient/family education    PT Goals (Current goals can  be found in the Care Plan section)  Acute Rehab PT Goals Patient Stated Goal: return home and able to climb steps to second level PT Goal Formulation: With patient/family Time For Goal Achievement: 07/03/22 Potential to Achieve Goals: Good    Frequency Min 1X/week     Co-evaluation               AM-PAC PT "6 Clicks" Mobility  Outcome Measure Help needed turning from your back to your side while in a flat bed without using bedrails?: A Little Help needed moving from lying on your back to sitting on the side of a flat bed without using bedrails?: A Lot Help needed moving to and from a bed to a chair (including a wheelchair)?: A Little Help needed standing up from a chair using your arms (e.g., wheelchair or bedside chair)?: A Little Help needed to walk in hospital room?: A Little Help needed climbing 3-5 steps with a railing? : A Lot 6 Click Score: 16    End of Session Equipment Utilized During Treatment: Gait belt Activity Tolerance: Patient limited by fatigue Patient left: in bed;with call bell/phone within reach;with family/visitor present Nurse Communication: Mobility status PT Visit Diagnosis: Unsteadiness on feet (R26.81);Muscle weakness (generalized) (M62.81);Pain Pain - Right/Left:  (midlline) Pain - part of body:  (chest)    Time: 4098-1191 PT Time Calculation (min) (ACUTE ONLY): 25 min   Charges:   PT Evaluation $PT Eval Low Complexity: 1 Low PT Treatments $Gait Training: 8-22 mins         Jerolyn Center,  PT Acute Rehabilitation Services  Office [phone]   Zena Amos 06/19/2022, 1:13 PM

## 2022-06-20 ENCOUNTER — Inpatient Hospital Stay (HOSPITAL_COMMUNITY): Payer: Medicaid Other

## 2022-06-20 LAB — BASIC METABOLIC PANEL
Anion gap: 10 (ref 5–15)
BUN: 62 mg/dL — ABNORMAL HIGH (ref 8–23)
CO2: 29 mmol/L (ref 22–32)
Calcium: 8.8 mg/dL — ABNORMAL LOW (ref 8.9–10.3)
Chloride: 95 mmol/L — ABNORMAL LOW (ref 98–111)
Creatinine, Ser: 1.83 mg/dL — ABNORMAL HIGH (ref 0.44–1.00)
GFR, Estimated: 31 mL/min — ABNORMAL LOW (ref >60)
Glucose, Bld: 180 mg/dL — ABNORMAL HIGH (ref 70–99)
Potassium: 4.5 mmol/L (ref 3.5–5.1)
Sodium: 134 mmol/L — ABNORMAL LOW (ref 135–145)

## 2022-06-20 LAB — GLUCOSE, CAPILLARY
Glucose-Capillary: 144 mg/dL — ABNORMAL HIGH (ref 70–99)
Glucose-Capillary: 141 mg/dL — ABNORMAL HIGH (ref 70–99)
Glucose-Capillary: 178 mg/dL — ABNORMAL HIGH (ref 70–99)
Glucose-Capillary: 167 mg/dL — ABNORMAL HIGH (ref 70–99)
Glucose-Capillary: 230 mg/dL — ABNORMAL HIGH (ref 70–99)

## 2022-06-20 LAB — CBC
HCT: 25 % — ABNORMAL LOW (ref 36.0–46.0)
Hemoglobin: 8.1 g/dL — ABNORMAL LOW (ref 12.0–15.0)
MCH: 27.5 pg (ref 26.0–34.0)
MCHC: 32.4 g/dL (ref 30.0–36.0)
MCV: 84.7 fL (ref 80.0–100.0)
Platelets: 131 10*3/uL — ABNORMAL LOW (ref 150–400)
RBC: 2.95 MIL/uL — ABNORMAL LOW (ref 3.87–5.11)
RDW: 13.2 % (ref 11.5–15.5)
WBC: 16.1 10*3/uL — ABNORMAL HIGH (ref 4.0–10.5)
nRBC: 0.4 % — ABNORMAL HIGH (ref 0.0–0.2)

## 2022-06-20 LAB — HEPARIN INDUCED PLATELET AB (HIT ANTIBODY): Heparin Induced Plt Ab: 0.062 OD (ref 0.000–0.400)

## 2022-06-20 LAB — PROTIME-INR
INR: 1.2 (ref 0.8–1.2)
Prothrombin Time: 15 seconds (ref 11.4–15.2)

## 2022-06-20 LAB — MAGNESIUM: Magnesium: 2.4 mg/dL (ref 1.7–2.4)

## 2022-06-20 LAB — PHOSPHORUS: Phosphorus: 2.5 mg/dL (ref 2.5–4.6)

## 2022-06-20 MED ORDER — FUROSEMIDE 40 MG PO TABS
40.0000 mg | ORAL_TABLET | Freq: Every day | ORAL | Status: DC
  Administered 2022-06-20 – 2022-06-22 (×3): 40 mg via ORAL
  Filled 2022-06-20 (×3): qty 1

## 2022-06-20 MED ORDER — POLYETHYLENE GLYCOL 3350 17 G PO PACK
17.0000 g | PACK | Freq: Every day | ORAL | Status: DC
  Administered 2022-06-20 – 2022-06-22 (×3): 17 g via ORAL
  Filled 2022-06-20 (×3): qty 1

## 2022-06-20 MED ORDER — INSULIN ASPART 100 UNIT/ML IJ SOLN
0.0000 [IU] | Freq: Three times a day (TID) | INTRAMUSCULAR | Status: DC
  Administered 2022-06-21 (×3): 8 [IU] via SUBCUTANEOUS
  Administered 2022-06-22: 2 [IU] via SUBCUTANEOUS
  Administered 2022-06-22: 4 [IU] via SUBCUTANEOUS

## 2022-06-20 MED ORDER — INSULIN DETEMIR 100 UNIT/ML ~~LOC~~ SOLN
20.0000 [IU] | Freq: Two times a day (BID) | SUBCUTANEOUS | Status: DC
  Administered 2022-06-20 (×2): 20 [IU] via SUBCUTANEOUS
  Filled 2022-06-20 (×4): qty 0.2

## 2022-06-20 MED ORDER — INSULIN ASPART 100 UNIT/ML IJ SOLN: 0.0000 [IU] | INTRAMUSCULAR | Status: DC

## 2022-06-20 MED ORDER — POTASSIUM CHLORIDE CRYS ER 20 MEQ PO TBCR
20.0000 meq | EXTENDED_RELEASE_TABLET | Freq: Every day | ORAL | Status: AC
  Administered 2022-06-20 – 2022-06-21 (×2): 20 meq via ORAL
  Filled 2022-06-20 (×2): qty 1

## 2022-06-20 NOTE — Evaluation (Signed)
Occupational Therapy Evaluation Patient Details Name: Chelsea English MRN: [id number] DOB: 11-22-1960 Today's Date: 06/20/2022   History of Present Illness 62 y/o person who presented 06/14/22 for elective aortic valve replacement. S/p median sternotomy; post-op 2nd degree heart block, respiratory failure and AKI; pt converted to NSR; PMH- HTN, CKD stg 3b, T2DM , HF.   Clinical Impression   Pt admitted for above dx, PTA patient daughter reports that she was ambulating no AD and has 24/7 support at home, recently she has been having assist with bathing and dressing. Pt currently limited by impaired UE use with sternal precautions, but pt demo's good carryover of skills and techniques with functional transfers. No sternal pain in today's OT session, but pt did not pain after mobility with PT. Trialed Tub transfer on 4E gym and pt had a much harder time safely clearing it, rec TTB for safe functional t/f in and out of tub. Pt needing occasional standing rest breaks during functional ambulation and demo's mildly impaired balance without use of RW. Pt would benefit from continued acute skilled OT services to address above deficits and help transition to next level of care. Patient would benefit from post acute Home OT services to help maximize functional independence in natural environment      Recommendations for follow up therapy are one component of a multi-disciplinary discharge planning process, led by the attending physician.  Recommendations may be updated based on patient status, additional functional criteria and insurance authorization.   Assistance Recommended at Discharge Intermittent Supervision/Assistance  Patient can return home with the following A little help with walking and/or transfers;Assistance with cooking/housework;Assist for transportation;Help with stairs or ramp for entrance;A little help with bathing/dressing/bathroom    Functional Status Assessment  Patient has had a recent  decline in their functional status and demonstrates the ability to make significant improvements in function in a reasonable and predictable amount of time.  Equipment Recommendations  Tub/shower bench    Recommendations for Other Services       Precautions / Restrictions Precautions Precautions: Sternal;Fall Precaution Booklet Issued: Yes (comment) Precaution Comments: Pt demonstrating carryover of precautions with transfers and bed mobility Required Braces or Orthoses:  (heart pillow to brace sternum) Restrictions Weight Bearing Restrictions: No RUE Weight Bearing: Non weight bearing LUE Weight Bearing: Non weight bearing Other Position/Activity Restrictions: sternal precautions      Mobility Bed Mobility Overal bed mobility: Needs Assistance Bed Mobility: Supine to Sit, Sit to Supine, Rolling Rolling: Min assist   Supine to sit: Min guard Sit to supine: Mod assist   General bed mobility comments: Pt needing assist to control BLE and clear bed for sit>sup    Transfers Overall transfer level: Needs assistance Equipment used: None Transfers: Sit to/from Stand Sit to Stand: Min guard           General transfer comment: STS from EOB, chair, and toilet      Balance Overall balance assessment: Mild deficits observed, not formally tested                                         ADL either performed or assessed with clinical judgement   ADL Overall ADL's : Needs assistance/impaired Eating/Feeding: Independent;Sitting   Grooming: Standing;Min guard   Upper Body Bathing: Sitting;Supervision/ safety   Lower Body Bathing: Sitting/lateral leans;Min guard   Upper Body Dressing : Sitting;Supervision/safety   Lower Body Dressing: Sitting/lateral  leans;Min guard   Toilet Transfer: Ambulation;Min guard;Rolling walker (2 wheels)   Toileting- Clothing Manipulation and Hygiene: Min guard;Sitting/lateral lean   Tub/ Engineer, structural: Moderate  assistance Tub/Shower Transfer Details (indicate cue type and reason): Attempted toilet t/f in 4E rehab gym, pt with increased challange attempted to clear tub. educated and demonstrated pt on the use of tub bench Functional mobility during ADLs: Min guard;Rolling walker (2 wheels) General ADL Comments: Hall level ambulation with rollator and with RW     Vision         Perception     Praxis      Pertinent Vitals/Pain Pain Assessment Pain Assessment: No/denies pain     Hand Dominance Right   Extremity/Trunk Assessment Upper Extremity Assessment Upper Extremity Assessment: RUE deficits/detail;LUE deficits/detail RUE Deficits / Details: sternal precautions LUE Deficits / Details: sternal precautions   Lower Extremity Assessment Lower Extremity Assessment: Generalized weakness       Communication Communication Communication: Prefers language other than English;Interpreter utilized (daughter present to intrepert)   Cognition Arousal/Alertness: Awake/alert Behavior During Therapy: WFL for tasks assessed/performed Overall Cognitive Status: Within Functional Limits for tasks assessed                                 General Comments: Pt discussing precautions and joking in her native language with daughter; daughter was present to translate throughout session     General Comments  VSS on 2L o2 during functional ambulation    Exercises     Shoulder Instructions      Home Living Family/patient expects to be discharged to:: Private residence Living Arrangements: Children Available Help at Discharge: Family;Available 24 hours/day (2 dtrs work nights, 1 dtr works days) Type of Home: House Home Access: Level entry     Home Layout: Two level;Bed/bath upstairs Alternate Level Stairs-Number of Steps: flight Alternate Level Stairs-Rails: Right Bathroom Shower/Tub: Tub/shower unit         Home Equipment: BSC/3in1;Rollator (4 wheels)          Prior  Functioning/Environment Prior Level of Function : Independent/Modified Independent             Mobility Comments: ind without AD ADLs Comments: daughter recently assisting with dressing and bathing        OT Problem List: Decreased strength;Decreased activity tolerance;Impaired UE functional use;Impaired balance (sitting and/or standing)      OT Treatment/Interventions: Self-care/ADL training;Therapeutic exercise;Therapeutic activities;Energy conservation;Patient/family education;DME and/or AE instruction;Balance training    OT Goals(Current goals can be found in the care plan section) Acute Rehab OT Goals Patient Stated Goal: to go home OT Goal Formulation: With patient Time For Goal Achievement: 07/04/22 Potential to Achieve Goals: Good ADL Goals Pt Will Perform Grooming: standing;with supervision Pt Will Perform Upper Body Dressing: with set-up;with supervision;sitting (while maintaining sternal precautions) Pt Will Transfer to Toilet: with supervision;ambulating  OT Frequency: Min 2X/week    Co-evaluation              AM-PAC OT "6 Clicks" Daily Activity     Outcome Measure Help from another person eating meals?: None Help from another person taking care of personal grooming?: A Little Help from another person toileting, which includes using toliet, bedpan, or urinal?: A Little Help from another person bathing (including washing, rinsing, drying)?: A Little Help from another person to put on and taking off regular upper body clothing?: A Little Help from another person to put on and  taking off regular lower body clothing?: A Little 6 Click Score: 19   End of Session Equipment Utilized During Treatment: Gait belt;Rolling walker (2 wheels);Rollator (4 wheels) Nurse Communication: Mobility status  Activity Tolerance: Patient tolerated treatment well Patient left: in bed;with call bell/phone within reach;with family/visitor present  OT Visit Diagnosis: Unsteadiness  on feet (R26.81);Muscle weakness (generalized) (M62.81)                Time: 9629-5284 OT Time Calculation (min): 60 min Charges:  OT General Charges $OT Visit: 1 Visit OT Evaluation $OT Eval Moderate Complexity: 1 Mod OT Treatments $Therapeutic Activity: 38-52 mins  06/20/2022  AB, OTR/L  Acute Rehabilitation Services  Office: [phone]   Tristan Schroeder 06/20/2022, 4:14 PM

## 2022-06-20 NOTE — TOC Progression Note (Signed)
Transition of Care [facility]) - Progression Note    Patient Details  Name: Chelsea English MRN: [id number] Date of Birth: 1961-01-25  [facility] CM/SW Contact  Graves-Bigelow, Lamar Laundry, RN Phone Number: 06/20/2022, 10:26 AM  Clinical Narrative:  Case Manager received a message from cardiac rehab. Patient in need of rolling walker. Patient had previous DME from [employer]. Case Manager ordered rolling walker via [employer] and it will be delivered to the room. Patient has Medicaid- may have to arrange outpatient PT for this patient due to insurance.    Expected Discharge Plan: Home w Home Health Services Barriers to Discharge: No Barriers Identified  Expected Discharge Plan and Services   Discharge Planning Services: CM Consult Post Acute Care Choice: Home Health Living arrangements for the past 2 months: Single Family Home                 DME Arranged: Walker rolling DME Agency: [employer] Date DME Agency Contacted: 06/20/22 Time DME Agency Contacted: 1026 Representative spoke with at DME Agency: Vaughan Basta   Social Determinants of Health (SDOH) Interventions SDOH Screenings   Food Insecurity: No Food Insecurity (06/16/2022)  Housing: Low Risk  (06/16/2022)  Transportation Needs: No Transportation Needs (06/16/2022)  Utilities: Not At Risk (06/16/2022)  Tobacco Use: Low Risk  (06/15/2022)    Readmission Risk Interventions     No data to display

## 2022-06-20 NOTE — Progress Notes (Addendum)
[address]       [place] [address]             [phone]     6 Days Post-Op Procedure(s) (LRB): AORTIC VALVE REPLACEMENT (AVR) USING SJM [facility] MECHANICAL HEART VALVE SIZE (N/A) TRANSESOPHAGEAL ECHOCARDIOGRAM (N/A) Subjective: Feeling better  Objective: Vital signs in last 24 hours: Temp:  [97.9 F (36.6 C)-98.4 F (36.9 C)] 98.3 F (36.8 C) (04/28 2001) Pulse Rate:  [76-84] 76 (04/28 2001) Cardiac Rhythm: Normal sinus rhythm;Bundle branch block;Heart block (04/28 1900) Resp:  [14-18] 16 (04/28 2001) BP: (116-134)/(55-79) 127/55 (04/28 2001) SpO2:  [92 %-100 %] 100 % (04/28 2001)  Hemodynamic parameters for last 24 hours:    Intake/Output from previous day: 04/28 0701 - 04/29 0700 In: 480 [P.O.:480] Out: 800 [Urine:800] Intake/Output this shift: No intake/output data recorded.  General appearance: alert, cooperative, and no distress Heart: regular rate and rhythm and occas extrasystole, no murmur Lungs: min dim in bases Abdomen: benign Extremities: no edema Wound: incis healing well  Lab Results: Recent Labs    06/19/22 0108 06/20/22 0130  WBC 14.7* 16.1*  HGB 7.9* 8.1*  HCT 23.7* 25.0*  PLT 100* 131*   BMET:  Recent Labs    06/18/22 0935 06/20/22 0130  NA 131* 134*  K 3.3* 4.5  CL 93* 95*  CO2 26 29  GLUCOSE 222* 180*  BUN 73* 62*  CREATININE 2.05* 1.83*  CALCIUM 8.4* 8.8*    PT/INR:  Recent Labs    06/20/22 0130  LABPROT 15.0  INR 1.2   ABG    Component Value Date/Time   PHART 7.299 (L) 06/14/2022 2116   HCO3 23.5 06/14/2022 2116   TCO2 25 06/14/2022 2116   ACIDBASEDEF 3.0 (H) 06/14/2022 2116   O2SAT 98 06/14/2022 2116   CBG (last 3)  Recent Labs    06/19/22 2000 06/19/22 2342 06/20/22 0337  GLUCAP 212* 193* 144*    Meds Scheduled Meds:  aspirin EC  81 mg Oral Daily   atorvastatin  40 mg Oral QHS   bisacodyl  10 mg Oral Daily   Or   bisacodyl  10 mg Rectal Daily   Chlorhexidine  Gluconate Cloth  6 each Topical Daily   docusate sodium  200 mg Oral Daily   furosemide  60 mg Intravenous BID   insulin aspart  0-24 Units Subcutaneous Q4H   insulin detemir  12 Units Subcutaneous Q12H   mouth rinse  15 mL Mouth Rinse 4 times per day   pantoprazole  40 mg Oral Daily   sodium chloride flush  10-40 mL Intracatheter Q12H   sodium chloride flush  3 mL Intravenous Q12H   warfarin  5 mg Oral q1600   Warfarin - Physician Dosing Inpatient   Does not apply q1600   Continuous Infusions:  sodium chloride     PRN Meds:.dextrose, ondansetron (ZOFRAN) IV, mouth rinse, oxyCODONE, phenol, sodium chloride flush, sodium chloride flush, traMADol  Xrays No results found.  Assessment/Plan: S/P Procedure(s) (LRB): AORTIC VALVE REPLACEMENT (AVR) USING SJM [facility] MECHANICAL HEART VALVE SIZE (N/A) TRANSESOPHAGEAL ECHOCARDIOGRAM (N/A) POD#6  1 afeb, VSS, SR 2 O2 sats good on 2 liters- she wears 2 liters at night and is planning to have sleep study after recovery as she has suspected OSA 3 good UOP- not all recorded, prob decrease lasix soon 4 INR 1.2- cont coumadin , 5 mg for now 5 renal fxn conts to stabilize, BUN/Cr- 62/1.83, has  CKD 3 B, creat peaked at 3.07 6 Blood sugars 140's-300's range- will increase insulin, was on a fair amt preop  7 slight increase in WBC from 14.7- 16.1 8 H/H improved trend with equilabration 9 thrombocytopenia normalizing, PLT 131K today 10 rehab modalities, pulm hygiene 11 requesting miralax- was on at home, had BM yesterday    LOS: 6 days    Rowe Clack PA-C Pager [phone] 06/20/2022  Agree with above Change to po lasix today Arranging discharge arrangements

## 2022-06-20 NOTE — Progress Notes (Signed)
SATURATION QUALIFICATIONS: (This note is used to comply with regulatory documentation for home oxygen)  Patient Saturations on Room Air at Rest = 92%  Patient Saturations on Room Air while Ambulating = 87%  Patient Saturations on 2 Liters of oxygen while Ambulating = 93%  Please briefly explain why patient needs home oxygen: Pt currently hypoxic on RA with exertion, normally only wears O2 at night.

## 2022-06-20 NOTE — Progress Notes (Signed)
Physical Therapy Treatment Patient Details Name: Chelsea English MRN: [id number] DOB: 1960-05-02 Today's Date: 06/20/2022   History of Present Illness 62 y/o person who presented 06/14/22 for elective aortic valve replacement. S/p median sternotomy; post-op 2nd degree heart block, respiratory failure and AKI; pt converted to NSR; PMH- HTN, CKD stg 3b, T2DM , HF.    PT Comments    Pt received sitting in room with daughter present and able to translate into [place] Arabic dialect, pt agreeable to therapy session with emphasis on caregiver training as well as transfer, gait and stair instruction. Pt making good progress toward goals, able to perform x5 reps of 7" step prior to c/o fatigue, pt has 10 steps to get to bedroom/bathroom and would benefit form further stair training prior to DC to ensure activity tolerance improved enough for safety at home. Pt hypoxic on RA with exertional tasks (she reports she normally only wears O2 at night) and needed 2L O2 [place] to maintain SpO2 above goal of 88% (confirmed O2 goal >88% with PA by phone). Pt needing min guard to minA for gait with RW, stair negotiation and transfers with min cues for improved technique with precautions. Pt continues to benefit from PT services to progress toward functional mobility goals.    Recommendations for follow up therapy are one component of a multi-disciplinary discharge planning process, led by the attending physician.  Recommendations may be updated based on patient status, additional functional criteria and insurance authorization.  Follow Up Recommendations       Assistance Recommended at Discharge Intermittent Supervision/Assistance  Patient can return home with the following Help with stairs or ramp for entrance;A little help with walking and/or transfers;A lot of help with bathing/dressing/bathroom;Assistance with cooking/housework   Equipment Recommendations  Rolling walker (2 wheels) (pt has rollator but needs RW for  second floor)    Recommendations for Other Services OT consult     Precautions / Restrictions Precautions Precautions: Sternal;Fall Precaution Booklet Issued: No Precaution Comments: verbal/visual demo/review, pt compliant w/min cues needed Restrictions Weight Bearing Restrictions: No Other Position/Activity Restrictions: sternal precautions     Mobility  Bed Mobility Overal bed mobility: Needs Assistance             General bed mobility comments: pt received seated on BSC, daughter present assisting her with peri-care after toileting.    Transfers Overall transfer level: Needs assistance Equipment used: None Transfers: Sit to/from Stand Sit to Stand: Min assist, Min guard           General transfer comment: from [facility], EOB and to chair; pt needed cues for UE on knees with fair carryover; daughter present to remind her/translate for precautions.    Ambulation/Gait Ambulation/Gait assistance: +2 safety/equipment, Min guard Gait Distance (Feet): 250 Feet Assistive device: Rolling walker (2 wheels) Gait Pattern/deviations: Step-through pattern, Decreased stride length       General Gait Details: No significant LOB, slow pace, pt takes one prolonged standing break; desat on RA to SpO2 88% for short distance in room so kept on 2L O2 [place] for hallway ambulation.   Stairs Stairs: Yes Stairs assistance: Min assist, +2 safety/equipment Stair Management: Sideways, Step to pattern (facing rail) Number of Stairs: 5 General stair comments: To prevent excessive weight bearing on one side of sternum with stairs and single rail, pt performed single 7" step in room x5 reps facing support/rail to simulate stairwell. Pt c/o moderate fatigue after 5 steps so returned to sit EOB to rest. Pt agreeable to attempt full  flight next session in stairwell to simulate home entry. SpO2 with noisy signal briefly desat to 85-87% on RA with exertion at bedside, improves with standing break and  pursed-lip breathing to 88% and above on RA. Pt placed back on 2L for hallway gait trial given possible desat with stair trial.      Balance Overall balance assessment: Mild deficits observed, not formally tested                                          Cognition Arousal/Alertness: Awake/alert Behavior During Therapy: WFL for tasks assessed/performed                                   General Comments: cognition NT; pt not very vocal, daughter present to translate        Exercises      General Comments General comments (skin integrity, edema, etc.): SpO2 92-98% on 2L with exertional tasks, mostly 88-90% on RA with exertion but briefly desat to 85-87% on RA so kept on 2L for hallway mobility.      Pertinent Vitals/Pain Pain Assessment Pain Assessment: 0-10 Pain Score: 7  Pain Location: sternum when using rollator, drops to 5/10 seated in chair Pain Descriptors / Indicators: Grimacing, Guarding Pain Intervention(s): Monitored during session, Repositioned           PT Goals (current goals can now be found in the care plan section) Acute Rehab PT Goals Patient Stated Goal: return home and able to climb steps to second level PT Goal Formulation: With patient/family Time For Goal Achievement: 07/03/22 Progress towards PT goals: Progressing toward goals    Frequency    Min 1X/week      PT Plan Current plan remains appropriate       AM-PAC PT "6 Clicks" Mobility   Outcome Measure  Help needed turning from your back to your side while in a flat bed without using bedrails?: A Little Help needed moving from lying on your back to sitting on the side of a flat bed without using bedrails?: A Little Help needed moving to and from a bed to a chair (including a wheelchair)?: A Little Help needed standing up from a chair using your arms (e.g., wheelchair or bedside chair)?: A Little Help needed to walk in hospital room?: A Little Help needed  climbing 3-5 steps with a railing? : A Little 6 Click Score: 18    End of Session Equipment Utilized During Treatment: Gait belt;Oxygen Activity Tolerance: Patient tolerated treatment well Patient left: with call bell/phone within reach;with family/visitor present;in chair (daughter present and will remain so she defers chair alarm) Nurse Communication: Mobility status;Other (comment) (pain score, O2 mild desat on RA so keeping her on 2L for now) PT Visit Diagnosis: Unsteadiness on feet (R26.81);Muscle weakness (generalized) (M62.81);Pain Pain - Right/Left:  (midlline) Pain - part of body:  (chest)     Time: 1610-9604 PT Time Calculation (min) (ACUTE ONLY): 39 min  Charges:  $Gait Training: 23-37 mins $Therapeutic Activity: 8-22 mins                     Franceska Strahm P., PTA Acute Rehabilitation Services Secure Chat Preferred 9a-5:30pm Office: [phone]    Dorathy Kinsman [facility] 06/20/2022, 1:46 PM

## 2022-06-20 NOTE — Inpatient Diabetes Management (Signed)
Inpatient Diabetes Program Recommendations  AACE/ADA: New Consensus Statement on Inpatient Glycemic Control (2015)  Target Ranges:  Prepandial:   less than 140 mg/dL      Peak postprandial:   less than 180 mg/dL (1-2 hours)      Critically ill patients:  140 - 180 mg/dL   Lab Results  Component Value Date   GLUCAP 141 (H) 06/20/2022   HGBA1C 9.9 (H) 06/10/2022    Review of Glycemic Control  Latest Reference Range & Units 06/19/22 12:09 06/19/22 16:34 06/19/22 20:00 06/19/22 23:42 06/20/22 03:37 06/20/22 08:37  Glucose-Capillary 70 - 99 mg/dL 161 (H) 096 (H) 045 (H) 193 (H) 144 (H) 141 (H)  (H): Data is abnormally high  Diabetes history: DM2 Outpatient Diabetes medications: 70/30 60 units QAM, 40 units QPM, Januvia 100 mg QD Current orders for Inpatient glycemic control: Levemir 20 units BID, Novolog 0-24 Q4H  Inpatient Diabetes Program Recommendations:    Noted basal insulin was increased today to 20 units BID.  Please consider:  Novolog 0-15 units TID and 0-5 units QHS as patient has a diet order and is eating.  Will continue to follow while inpatient.  Thank you, Dulce Sellar, MSN, CDCES Diabetes Coordinator Inpatient Diabetes Program [phone] (team pager from 8a-5p)

## 2022-06-20 NOTE — Progress Notes (Signed)
CARDIAC REHAB PHASE I   PRE:  Rate/Rhythm: 80 SR   BP:  Sitting: 126/65      SaO2: 98 2L/[place]   MODE:  Ambulation: 120 ft   POST:  Rate/Rhythm: 94 SR  BP:  Sitting: 163/73      SaO2: 95 2L/[place]   Pt ambulated in hall using front wheel walker, moving at slow steady pace. Tolerated well with no pain, dizziness or SOB. Returned to bed with call bell and bedside table in reach. Encouraged continued ambulation and IS use today.  Will continue to follow. Daughter with pt to help with translation.   1015-1100  Woodroe Chen, RN BSN 06/20/2022 11:05 AM

## 2022-06-21 LAB — BASIC METABOLIC PANEL
Anion gap: 14 (ref 5–15)
BUN: 60 mg/dL — ABNORMAL HIGH (ref 8–23)
CO2: 27 mmol/L (ref 22–32)
Calcium: 8.9 mg/dL (ref 8.9–10.3)
Chloride: 91 mmol/L — ABNORMAL LOW (ref 98–111)
Creatinine, Ser: 1.73 mg/dL — ABNORMAL HIGH (ref 0.44–1.00)
GFR, Estimated: 33 mL/min — ABNORMAL LOW (ref >60)
Glucose, Bld: 248 mg/dL — ABNORMAL HIGH (ref 70–99)
Potassium: 4.9 mmol/L (ref 3.5–5.1)
Sodium: 132 mmol/L — ABNORMAL LOW (ref 135–145)

## 2022-06-21 LAB — CBC
HCT: 23.7 % — ABNORMAL LOW (ref 36.0–46.0)
Hemoglobin: 7.8 g/dL — ABNORMAL LOW (ref 12.0–15.0)
MCH: 27.9 pg (ref 26.0–34.0)
MCHC: 32.9 g/dL (ref 30.0–36.0)
MCV: 84.6 fL (ref 80.0–100.0)
Platelets: 149 10*3/uL — ABNORMAL LOW (ref 150–400)
RBC: 2.8 MIL/uL — ABNORMAL LOW (ref 3.87–5.11)
RDW: 13.4 % (ref 11.5–15.5)
WBC: 16.5 10*3/uL — ABNORMAL HIGH (ref 4.0–10.5)
nRBC: 0.4 % — ABNORMAL HIGH (ref 0.0–0.2)

## 2022-06-21 LAB — PROTIME-INR
INR: 1.2 (ref 0.8–1.2)
Prothrombin Time: 15.4 seconds — ABNORMAL HIGH (ref 11.4–15.2)

## 2022-06-21 LAB — GLUCOSE, CAPILLARY
Glucose-Capillary: 233 mg/dL — ABNORMAL HIGH (ref 70–99)
Glucose-Capillary: 233 mg/dL — ABNORMAL HIGH (ref 70–99)
Glucose-Capillary: 201 mg/dL — ABNORMAL HIGH (ref 70–99)
Glucose-Capillary: 159 mg/dL — ABNORMAL HIGH (ref 70–99)

## 2022-06-21 MED ORDER — ENOXAPARIN SODIUM 40 MG/0.4ML IJ SOSY
40.0000 mg | PREFILLED_SYRINGE | INTRAMUSCULAR | Status: DC
  Administered 2022-06-21 – 2022-06-22 (×2): 40 mg via SUBCUTANEOUS
  Filled 2022-06-21 (×2): qty 0.4

## 2022-06-21 MED ORDER — FE FUM-VIT C-VIT B12-FA 460-60-0.01-1 MG PO CAPS
1.0000 | ORAL_CAPSULE | Freq: Every day | ORAL | Status: DC
  Administered 2022-06-21 – 2022-06-22 (×2): 1 via ORAL
  Filled 2022-06-21 (×2): qty 1

## 2022-06-21 MED ORDER — WARFARIN SODIUM 5 MG PO TABS
7.5000 mg | ORAL_TABLET | Freq: Once | ORAL | Status: AC
  Administered 2022-06-21: 7.5 mg via ORAL
  Filled 2022-06-21: qty 1

## 2022-06-21 MED ORDER — TAB-A-VITE/IRON PO TABS
1.0000 | ORAL_TABLET | Freq: Every day | ORAL | Status: DC
  Filled 2022-06-21: qty 1

## 2022-06-21 MED ORDER — INSULIN DETEMIR 100 UNIT/ML ~~LOC~~ SOLN
25.0000 [IU] | Freq: Two times a day (BID) | SUBCUTANEOUS | Status: DC
  Administered 2022-06-21 (×2): 25 [IU] via SUBCUTANEOUS
  Filled 2022-06-21 (×4): qty 0.25

## 2022-06-21 NOTE — Progress Notes (Signed)
CARDIAC REHAB PHASE I   PRE:  Rate/Rhythm: 82 SR  BP:  Sitting: 125/59      SaO2: 96 1L/[place]  MODE:  Ambulation: 225 ft   POST:  Rate/Rhythm: 96 SR  BP:  Sitting: 169/70      SaO2: 95 1L/[place]  Pt ambulated in hall using front wheel walker, moving at a slow steady pace. Tolerated well with no dizziness, SOB or pain. Returned to chair with call bell and bedside table in reach. Will continue to follow.   [id number]  Woodroe Chen, RN BSN 06/21/2022 11:57 AM

## 2022-06-21 NOTE — Progress Notes (Signed)
Occupational Therapy Treatment Patient Details Name: Chelsea English MRN: [id number] DOB: August 28, 1960 Today's Date: 06/21/2022   History of present illness 62 y/o person who presented 06/14/22 for elective aortic valve replacement. S/p median sternotomy; post-op 2nd degree heart block, respiratory failure and AKI; pt converted to NSR; PMH- HTN, CKD stg 3b, T2DM , HF.   OT comments  Pt continuing to progress towards patient focused goals, Pt continuing to desat on RA so placed back on 1L for functional activity. Pt doing better with balance, ambulating with Supervision + RW and demo's good carryover of sternal precautions. Pt mainly needs assist with Sit>supine leg control but is able to perform bADL without much challenge. OT to continue to progress patient as able. DC plans remain appropriate for [name]   Recommendations for follow up therapy are one component of a multi-disciplinary discharge planning process, led by the attending physician.  Recommendations may be updated based on patient status, additional functional criteria and insurance authorization.    Assistance Recommended at Discharge Intermittent Supervision/Assistance  Patient can return home with the following  A little help with walking and/or transfers;Assistance with cooking/housework;Assist for transportation;Help with stairs or ramp for entrance;A little help with bathing/dressing/bathroom   Equipment Recommendations  Tub/shower bench    Recommendations for Other Services      Precautions / Restrictions Precautions Precautions: Sternal;Fall Precaution Booklet Issued: Yes (comment) Precaution Comments: Pt using heart cushion to assist with STS and bed mobility Required Braces or Orthoses:  (heart pillow) Restrictions Weight Bearing Restrictions: Yes RUE Weight Bearing: Non weight bearing LUE Weight Bearing: Non weight bearing Other Position/Activity Restrictions: sternal precautions       Mobility Bed Mobility   Bed  Mobility: Sit to Supine       Sit to supine: Mod assist   General bed mobility comments: Pt needing assist to control BLE and clear bed for sit>sup    Transfers Overall transfer level: Needs assistance Equipment used: None Transfers: Sit to/from Stand Sit to Stand: Supervision           General transfer comment: uses heart pillow to assist     Balance Overall balance assessment: Mild deficits observed, not formally tested                                         ADL either performed or assessed with clinical judgement   ADL                           Toilet Transfer: Ambulation;Supervision/safety;Rolling walker (2 wheels)   Toileting- Clothing Manipulation and Hygiene: Sitting/lateral lean;Min guard       Functional mobility during ADLs: Supervision/safety;Rolling walker (2 wheels)      Extremity/Trunk Assessment              Vision       Perception     Praxis      Cognition Arousal/Alertness: Awake/alert Behavior During Therapy: WFL for tasks assessed/performed Overall Cognitive Status: Within Functional Limits for tasks assessed                                          Exercises      Shoulder Instructions       General Comments VSS on 1L 02,  Sp02 at 95%. Attempted RA but patient desat to 87% during functional activity despite pursed lip breathing techniques    Pertinent Vitals/ Pain       Pain Assessment Pain Assessment: No/denies pain  Home Living                                          Prior Functioning/Environment              Frequency  Min 2X/week        Progress Toward Goals  OT Goals(current goals can now be found in the care plan section)  Progress towards OT goals: Progressing toward goals  Acute Rehab OT Goals Patient Stated Goal: to go home OT Goal Formulation: With patient Time For Goal Achievement: 07/04/22 Potential to Achieve Goals: Good   Plan      Co-evaluation                 AM-PAC OT "6 Clicks" Daily Activity     Outcome Measure   Help from another person eating meals?: None Help from another person taking care of personal grooming?: A Little Help from another person toileting, which includes using toliet, bedpan, or urinal?: A Little Help from another person bathing (including washing, rinsing, drying)?: A Little Help from another person to put on and taking off regular upper body clothing?: A Little Help from another person to put on and taking off regular lower body clothing?: A Little 6 Click Score: 19    End of Session Equipment Utilized During Treatment: Gait belt;Rolling walker (2 wheels)  OT Visit Diagnosis: Unsteadiness on feet (R26.81);Muscle weakness (generalized) (M62.81)   Activity Tolerance Patient tolerated treatment well   Patient Left in bed;with call bell/phone within reach;with family/visitor present   Nurse Communication Mobility status        Time: 1610-9604 OT Time Calculation (min): 32 min  Charges: OT General Charges $OT Visit: 1 Visit OT Treatments $Self Care/Home Management : 8-22 mins $Therapeutic Activity: 8-22 mins  06/21/2022  AB, OTR/L  Acute Rehabilitation Services  Office: [phone]   Tristan Schroeder 06/21/2022, 4:01 PM

## 2022-06-21 NOTE — Progress Notes (Signed)
7 Days Post-Op Procedure(s) (LRB): AORTIC VALVE REPLACEMENT (AVR) USING SJM [facility] MECHANICAL HEART VALVE SIZE (N/A) TRANSESOPHAGEAL ECHOCARDIOGRAM (N/A) Subjective: Conts to feels better  Objective: Vital signs in last 24 hours: Temp:  [98 F (36.7 C)-99 F (37.2 C)] 98.1 F (36.7 C) (04/30 0300) Pulse Rate:  [77-80] 80 (04/29 2027) Cardiac Rhythm: Heart block;Normal sinus rhythm (04/29 1900) Resp:  [14-21] 17 (04/30 0300) BP: (123-156)/(59-71) 156/59 (04/30 0300) SpO2:  [95 %-98 %] 97 % (04/30 0300) Weight:  [90.6 kg] 90.6 kg (04/30 0655)  Hemodynamic parameters for last 24 hours:    Intake/Output from previous day: 04/29 0701 - 04/30 0700 In: -  Out: 930 [Urine:930] Intake/Output this shift: No intake/output data recorded.  General appearance: alert, cooperative, and no distress Heart: regular rate and rhythm Lungs: mildly dim in bases Abdomen: benign Extremities: minor edema Wound: incis healing well  Lab Results: Recent Labs    06/20/22 0130 06/21/22 0112  WBC 16.1* 16.5*  HGB 8.1* 7.8*  HCT 25.0* 23.7*  PLT 131* 149*   BMET:  Recent Labs    06/20/22 0130 06/21/22 0112  NA 134* 132*  K 4.5 4.9  CL 95* 91*  CO2 29 27  GLUCOSE 180* 248*  BUN 62* 60*  CREATININE 1.83* 1.73*  CALCIUM 8.8* 8.9    PT/INR:  Recent Labs    06/21/22 0112  LABPROT 15.4*  INR 1.2   ABG    Component Value Date/Time   PHART 7.299 (L) 06/14/2022 2116   HCO3 23.5 06/14/2022 2116   TCO2 25 06/14/2022 2116   ACIDBASEDEF 3.0 (H) 06/14/2022 2116   O2SAT 98 06/14/2022 2116   CBG (last 3)  Recent Labs    06/20/22 1714 06/20/22 2048 06/21/22 0647  GLUCAP 167* 230* 233*    Meds Scheduled Meds:  aspirin EC  81 mg Oral Daily   atorvastatin  40 mg Oral QHS   bisacodyl  10 mg Oral Daily   Or   bisacodyl  10 mg Rectal Daily   docusate sodium  200 mg Oral Daily   furosemide  40 mg Oral Daily   insulin aspart  0-24 Units Subcutaneous TID WC   insulin detemir   20 Units Subcutaneous Q12H   mouth rinse  15 mL Mouth Rinse 4 times per day   pantoprazole  40 mg Oral Daily   polyethylene glycol  17 g Oral Daily   potassium chloride  20 mEq Oral Daily   sodium chloride flush  10-40 mL Intracatheter Q12H   sodium chloride flush  3 mL Intravenous Q12H   warfarin  5 mg Oral q1600   Warfarin - Physician Dosing Inpatient   Does not apply q1600   Continuous Infusions:  sodium chloride     PRN Meds:.dextrose, ondansetron (ZOFRAN) IV, mouth rinse, oxyCODONE, phenol, sodium chloride flush, sodium chloride flush, traMADol  Xrays DG Chest 2 View  Result Date: 06/20/2022 CLINICAL DATA:  Status post aortic valve replacement. EXAM: CHEST - 2 VIEW COMPARISON:  06/17/2022 FINDINGS: Stable heart size after median sternotomy and aortic valve replacement. Lower bilateral lung volumes with bibasilar atelectasis, left greater than right, and probable small bilateral pleural effusions. No overt pulmonary edema or pneumothorax. IMPRESSION: Lower bilateral lung volumes with bibasilar atelectasis and probable small bilateral pleural effusions. Electronically Signed   By: Irish Lack M.D.   On: 06/20/2022 08:03    Assessment/Plan: S/P Procedure(s) (LRB): AORTIC VALVE REPLACEMENT (AVR) USING SJM [place] MECHANICAL HEART VALVE SIZE (N/A) TRANSESOPHAGEAL ECHOCARDIOGRAM (  N/A)  1 Tmax 99, VSS, sinus w/ 1 deg block 2 sats good on RA-2 liters, wears at night 3 weight conts to trend lower, UOP not all recorded 4 BUN/creat conts to trend lower, + CKD 3 B 5 minor hyponatremia- follow 6 leukocytosis stable with WBC 16.5 7 leukocytosis near resolved149 K+ , add lovenox 8 INR 1.2- will give coumadin 7.5 today 9 BS improving control 140's- 233- will increase a bit more, home dosing at d/c 10 H/H fairly stable 7.8 /23.7- add iron 11 cont rehab and pulm hygiene, likely home in next 24-48 hours   LOS: 7 days    Rowe Clack PA-C Pager [phone] 06/21/2022

## 2022-06-22 LAB — GLUCOSE, CAPILLARY
Glucose-Capillary: 126 mg/dL — ABNORMAL HIGH (ref 70–99)
Glucose-Capillary: 186 mg/dL — ABNORMAL HIGH (ref 70–99)
Glucose-Capillary: 159 mg/dL — ABNORMAL HIGH (ref 70–99)

## 2022-06-22 LAB — PROTIME-INR
INR: 1.4 — ABNORMAL HIGH (ref 0.8–1.2)
Prothrombin Time: 16.7 seconds — ABNORMAL HIGH (ref 11.4–15.2)

## 2022-06-22 LAB — ALDOSTERONE + RENIN ACTIVITY W/ RATIO
ALDO / PRA Ratio: 2.9 (ref 0.0–30.0)
Aldosterone: 51.5 ng/dL — ABNORMAL HIGH (ref 0.0–30.0)
PRA LC/MS/MS: 17.581 ng/mL/hr — ABNORMAL HIGH (ref 0.167–5.380)

## 2022-06-22 MED ORDER — WARFARIN SODIUM 5 MG PO TABS
7.5000 mg | ORAL_TABLET | Freq: Once | ORAL | Status: AC
  Administered 2022-06-22: 7.5 mg via ORAL
  Filled 2022-06-22: qty 1

## 2022-06-22 MED ORDER — WARFARIN SODIUM 5 MG PO TABS: 7.5000 mg | ORAL_TABLET | Freq: Once | ORAL | 1 refills | Status: DC

## 2022-06-22 MED ORDER — FUROSEMIDE 40 MG PO TABS: 40.0000 mg | ORAL_TABLET | Freq: Every day | ORAL | 0 refills | Status: AC

## 2022-06-22 MED ORDER — TRAMADOL HCL 50 MG PO TABS: 50.0000 mg | ORAL_TABLET | Freq: Four times a day (QID) | ORAL | 0 refills | Status: AC | PRN

## 2022-06-22 MED ORDER — INSULIN DETEMIR 100 UNIT/ML ~~LOC~~ SOLN
30.0000 [IU] | Freq: Two times a day (BID) | SUBCUTANEOUS | Status: DC
  Administered 2022-06-22: 30 [IU] via SUBCUTANEOUS
  Filled 2022-06-22 (×2): qty 0.3

## 2022-06-22 MED ORDER — HUMULIN 70/30 (70-30) 100 UNIT/ML ~~LOC~~ SUSP: 40.0000 [IU] | SUBCUTANEOUS | 11 refills | Status: AC

## 2022-06-22 MED ORDER — AMLODIPINE BESYLATE 5 MG PO TABS
5.0000 mg | ORAL_TABLET | Freq: Every day | ORAL | Status: DC
  Administered 2022-06-22: 5 mg via ORAL
  Filled 2022-06-22: qty 1

## 2022-06-22 NOTE — Progress Notes (Signed)
Physical Therapy Treatment Patient Details Name: Chelsea English MRN: [id number] DOB: [birth year] Today's Date: 06/22/2022   History of Present Illness 62 y/o person who presented 06/14/22 for elective aortic valve replacement. S/p median sternotomy; post-op 2nd degree heart block, respiratory failure and AKI; pt converted to NSR; PMH- HTN, CKD stg 3b, T2DM, HF.    PT Comments    Pt received in supine, agreeable to therapy session and with good participation and improved tolerance for gait training and fair tolerance for stair negotiation, pt fatigued after 5 steps and had to return to room. Pt has full flight of 10 steps to get up to bedroom but per daughter she can stay on first floor for a day or two until her endurance improves. Pt needing up to +2 minA for safety with stair negotiations in order to maintain compliance with her Move in the Tube precautions and min guard for bed mobility/transfers from elevate hospital head of bed. Pt reliant on supplemental O2 1-2L/min to maintain SpO2 within goal >88% with exertional tasks this date. Pt continues to benefit from PT services to progress toward functional mobility goals.   Recommendations for follow up therapy are one component of a multi-disciplinary discharge planning process, led by the attending physician.  Recommendations may be updated based on patient status, additional functional criteria and insurance authorization.  Follow Up Recommendations       Assistance Recommended at Discharge Intermittent Supervision/Assistance  Patient can return home with the following Help with stairs or ramp for entrance;A little help with walking and/or transfers;A lot of help with bathing/dressing/bathroom;Assistance with cooking/housework   Equipment Recommendations  Rolling walker (2 wheels);Other (comment) (pt has rollator but needs RW for second floor, also anticipate supplemental O2 needs)    Recommendations for Other Services       Precautions /  Restrictions Precautions Precautions: Sternal;Fall Precaution Booklet Issued: Yes (comment) Precaution Comments: Pt using heart cushion to assist with STS and bed mobility Required Braces or Orthoses:  (heart pillow) Restrictions Weight Bearing Restrictions: No Other Position/Activity Restrictions: sternal precautions     Mobility  Bed Mobility Overal bed mobility: Needs Assistance Bed Mobility: Sidelying to Sit, Rolling Rolling: Modified independent (Device/Increase time) Sidelying to sit: Min assist, HOB elevated       General bed mobility comments: daughter able to assist her with PTA standing by, pt using heart pillow and good compliance with precs with HOB elevated. Pt requesting to sit EOB with family present at end of session.    Transfers Overall transfer level: Needs assistance Equipment used: Rolling walker (2 wheels), None Transfers: Sit to/from Stand Sit to Stand: Supervision, Min guard           General transfer comment: uses heart pillow to assist, Supervision first attempt then min guard second attempt and pt using momentum to achieve upright.    Ambulation/Gait Ambulation/Gait assistance: Min guard Gait Distance (Feet): 150 Feet Assistive device: Rolling walker (2 wheels) Gait Pattern/deviations: Step-through pattern, Decreased stride length       General Gait Details: No significant LOB, slow pace, pt takes a few brief standing breaks; desat on RA to <88% SpO2 for short distance in room so kept on 2L O2 [place] for hallway ambulation, RN notified. Pt c/o increased BLE pain/fatigue after performing stairs.   Stairs Stairs: Yes Stairs assistance: Min assist, +2 safety/equipment Stair Management: Sideways, Step to pattern, One rail Left (facing rail) Number of Stairs: 5 General stair comments: To prevent excessive weight bearing on one side of  sternum with stairs and single rail, pt performed 5 steps in stairwell facing rail. Pt c/o moderate fatigue and BLE  pain after 5 steps so returned to room to rest. Pt unable to perform full flight to bedroom level, her daughter states she can stay on first floor until her endurance improves but only has full bath on second floor.   Wheelchair Mobility    Modified Rankin (Stroke Patients Only)       Balance Overall balance assessment: Mild deficits observed, not formally tested                                          Cognition Arousal/Alertness: Lethargic Behavior During Therapy: WFL for tasks assessed/performed Overall Cognitive Status: Within Functional Limits for tasks assessed                                 General Comments: Slow processing, pt needs increased cues for Move in the Tube precs this date when performing stair trial; printed handout for pt/family on post-sternotomy precs and exercises to reinforce as daughter can translate precs for her. Pt appears drowsy, her daughter states she is normally more alert.        Exercises Other Exercises Other Exercises: sternal precs and exercises handout given to reinforce with pt/daughter, also handout for IS use/frequency, encouraged her to continue    General Comments General comments (skin integrity, edema, etc.): SpO2 WFL at rest on 1L O2 [place], when placed on RA pt SpO2 WFL however with exertional tasks desat to <88%, needed 2L O2 [place] to maintain SpO2 >88% for stair negotiation.      Pertinent Vitals/Pain Pain Assessment Pain Assessment: 0-10 Pain Score: 8  Pain Location: sternum with gait/stair training Pain Descriptors / Indicators: Grimacing, Guarding Pain Intervention(s): Monitored during session, Limited activity within patient's tolerance, Repositioned, Patient requesting pain meds-RN notified (pt defers ice pack for musculoskeletal/incisional pain)    Home Living                          Prior Function            PT Goals (current goals can now be found in the English plan section)  Acute Rehab PT Goals Patient Stated Goal: return home and able to climb steps to second level PT Goal Formulation: With patient/family Time For Goal Achievement: 07/03/22 Progress towards PT goals: Progressing toward goals    Frequency    Min 1X/week      PT Plan Current plan remains appropriate    Co-evaluation              AM-PAC PT "6 Clicks" Mobility   Outcome Measure  Help needed turning from your back to your side while in a flat bed without using bedrails?: A Little Help needed moving from lying on your back to sitting on the side of a flat bed without using bedrails?: A Little Help needed moving to and from a bed to a chair (including a wheelchair)?: A Little Help needed standing up from a chair using your arms (e.g., wheelchair or bedside chair)?: A Little Help needed to walk in hospital room?: A Little Help needed climbing 3-5 steps with a railing? : A Lot (mod cues for proper technique/safety) 6 Click Score: 17  End of Session Equipment Utilized During Treatment: Gait belt;Oxygen Activity Tolerance: Patient tolerated treatment well;Patient limited by fatigue Patient left: in bed;with call bell/phone within reach;with family/visitor present (pt sitting EOB, refusing return to supine wanting to sit, daughter will call for assist if needed.) Nurse Communication: Mobility status;Patient requests pain meds;Other (comment) (needed 2L/min for gait) PT Visit Diagnosis: Unsteadiness on feet (R26.81);Muscle weakness (generalized) (M62.81);Pain Pain - part of body:  (incisional)     Time: 1230-1300 PT Time Calculation (min) (ACUTE ONLY): 30 min  Charges:  $Gait Training: 8-22 mins $Therapeutic Activity: 8-22 mins                     Eliese Kerwood P., PTA Acute Rehabilitation Services Secure Chat Preferred 9a-5:30pm Office: [phone]    Dorathy Kinsman [facility] 06/22/2022, 1:20 PM

## 2022-06-22 NOTE — TOC Transition Note (Signed)
Transition of Care (TOC) - CM/SW Discharge Note Donn Pierini RN, BSN Transitions of Care Unit 4E- RN Case Manager See Treatment Team for direct phone #   Patient Details  Name: Chelsea English MRN: [id number] Date of Birth: 30-Aug-1960  Transition of Care [facility]) CM/SW Contact:  Darrold Span, RN Phone Number: 06/22/2022, 4:46 PM   Clinical Narrative:    Pt stable for transition home, orders have been placed for [facility] and DME needs.  Pt has home 02 w/ [employer] but states she has been paying out of pocket.  Qualifying note placed for PT.   CM called and spoke with [employer] liaison- and confirmed pt was paying out of pocket for home 02- night time use only. Notified liaison pt now has new orders and qualifying note for insurance billing needs.  [employer] to follow up and will deliver portable 02 for discharge as well as shower chair, pt already has RW that was previously delivered.   CM spoke with pt and family at bedside- daughter on TC in route to transport home. Discussed HHPT needs- Iantha Fallen has received office protocol referral and is checking to see if they can accept referral- discussed w/ daughter if [facility] can not accept if they wanted a referral to outpt for PT needs- daughter agreeable to outpt referral close to home if [facility] can not be secured. Daughter also voiced that she had spoken with [employer] and requested longer 02 tubing- they are suppose to deliver 75ft tubing- call made to liaison and verified that [employer] would be delivering this to the home.   8- spoke w/ Bjorn Loser w/ [facility]- they are going to accept referral for HHPT needs and will see pt for number of approved visits per Medicaid then assist pt with outpt if PT still needed.   Family to transport home. No further needs noted.    Final next level of care: Home w Home Health Services Barriers to Discharge: No Barriers Identified   Patient Goals and CMS Choice CMS Medicare.gov Compare Post Acute Care list provided to::  Patient Choice offered to / list presented to : Patient, Adult Children  Discharge Placement                 Home w/ [facility]        Discharge Plan and Services Additional resources added to the After Visit Summary for     Discharge Planning Services: CM Consult Post Acute Care Choice: Durable Medical Equipment, Home Health          DME Arranged: Oxygen, Shower stool DME Agency: [employer] Date DME Agency Contacted: 06/22/22 Time DME Agency Contacted: 1330 Representative spoke with at DME Agency: Vaughan Basta HH Arranged: PT   Date HH Agency Contacted: 06/22/22      Social Determinants of Health (SDOH) Interventions SDOH Screenings   Food Insecurity: No Food Insecurity (06/16/2022)  Housing: Low Risk  (06/16/2022)  Transportation Needs: No Transportation Needs (06/16/2022)  Utilities: Not At Risk (06/16/2022)  Tobacco Use: Low Risk  (06/15/2022)     Readmission Risk Interventions    06/22/2022    2:41 PM  Readmission Risk Prevention Plan  Transportation Screening Complete  PCP or Specialist Appt within 3-5 Days Complete  HRI or Home Care Consult Complete  Social Work Consult for Recovery Care Planning/Counseling Complete  Palliative Care Screening Not Applicable  Medication Review Oceanographer) Complete

## 2022-06-22 NOTE — Progress Notes (Signed)
[address]       [name] [address]             [phone]    HPI:  Chelsea English is S/P Mechanical AVR by Dr. Leafy Ro on 06/14/2022.  Her hospital course was complicated by 2nd degree HB which resolved without intervention.  She has a history of CKD with creatinine level peaking at 3, this also resolved without intervention.  She used oxygen prior to admission and this was continued.  She presents today for 1 week follow up.  She is accompanied by her daughter who helped provide translation.  A translator was also present.  She overall is doing well.  She is up and ambulating and notices she does not fatigue as easy as she did prior to surgery.  She continues to have discomfort across her chest and under her arms.  She requests a refill for pain medication.  She has not had her coumadin checked for her mechanical valve.  She also did not have a CXR taken prior to her appointment.  She continues to use oxygen. She is working with home PT, however they stated she can only have 3 visits and they ask if we are able to try and get more approved.  No current facility-administered medications for this visit.   No current outpatient medications on file.   Facility-Administered Medications Ordered in Other Visits  Medication Dose Route Frequency Provider Last Rate Last Admin   0.9 %  sodium chloride infusion  250 mL Intravenous Continuous Gold, Wayne E, PA-C       amLODipine (NORVASC) tablet 5 mg  5 mg Oral Daily Gold, Wayne E, PA-C   5 mg at 06/22/22 9147   aspirin EC tablet 81 mg  81 mg Oral Daily Eugenio Hoes, MD   81 mg at 06/22/22 0905   atorvastatin (LIPITOR) tablet 40 mg  40 mg Oral QHS Gold, Wayne E, PA-C   40 mg at 06/21/22 2157   bisacodyl (DULCOLAX) EC tablet 10 mg  10 mg Oral Daily Gold, Deniece Portela E, PA-C   10 mg at 06/19/22 8295   Or   bisacodyl (DULCOLAX) suppository 10 mg  10 mg Rectal Daily Gold, Wayne E, PA-C       dextrose 50 % solution 0-50 mL  0-50 mL  Intravenous PRN Gold, Wayne E, PA-C       docusate sodium (COLACE) capsule 200 mg  200 mg Oral Daily Gold, Wayne E, PA-C   200 mg at 06/22/22 0905   enoxaparin (LOVENOX) injection 40 mg  40 mg Subcutaneous Q24H Gold, Wayne E, PA-C   40 mg at 06/22/22 6213   Fe Fum-Vit C-Vit B12-FA (TRIGELS-F FORTE) capsule 1 capsule  1 capsule Oral QPC breakfast Gold, Deniece Portela E, PA-C   1 capsule at 06/22/22 0905   furosemide (LASIX) tablet 40 mg  40 mg Oral Daily Eugenio Hoes, MD   40 mg at 06/22/22 0905   insulin aspart (novoLOG) injection 0-24 Units  0-24 Units Subcutaneous TID WC Eugenio Hoes, MD   2 Units at 06/22/22 0641   insulin detemir (LEVEMIR) injection 30 Units  30 Units Subcutaneous Q12H Gold, Wayne E, PA-C       ondansetron (ZOFRAN) injection 4 mg  4 mg Intravenous Q6H PRN Gershon Crane E, PA-C       Oral care mouth rinse  15 mL Mouth Rinse 4 times per day Eugenio Hoes, MD   15 mL at 06/22/22  0800   Oral care mouth rinse  15 mL Mouth Rinse PRN Eugenio Hoes, MD       oxyCODONE (Oxy IR/ROXICODONE) immediate release tablet 5-10 mg  5-10 mg Oral Q3H PRN Gershon Crane E, PA-C   10 mg at 06/21/22 0255   pantoprazole (PROTONIX) EC tablet 40 mg  40 mg Oral Daily Gold, Wayne E, PA-C   40 mg at 06/22/22 0905   phenol (CHLORASEPTIC) mouth spray 1 spray  1 spray Mouth/Throat PRN Lorin Glass, MD       polyethylene glycol (MIRALAX / GLYCOLAX) packet 17 g  17 g Oral Daily Gold, Wayne E, PA-C   17 g at 06/22/22 0911   sodium chloride flush (NS) 0.9 % injection 10-40 mL  10-40 mL Intracatheter Q12H Eugenio Hoes, MD   10 mL at 06/21/22 1237   sodium chloride flush (NS) 0.9 % injection 10-40 mL  10-40 mL Intracatheter PRN Eugenio Hoes, MD       sodium chloride flush (NS) 0.9 % injection 3 mL  3 mL Intravenous Q12H Gold, Wayne E, PA-C   3 mL at 06/22/22 0912   sodium chloride flush (NS) 0.9 % injection 3 mL  3 mL Intravenous PRN Gershon Crane E, PA-C   3 mL at 06/19/22 2136   traMADol (ULTRAM) tablet 50-100 mg  50-100  mg Oral Q4H PRN Gershon Crane E, PA-C   100 mg at 06/21/22 2019   warfarin (COUMADIN) tablet 7.5 mg  7.5 mg Oral ONCE-1600 Rowe Clack, PA-C       Warfarin - Physician Dosing Inpatient   Does not apply q1600 [place] Anis [facility]        Physical Exam:  BP (!) 150/75   Pulse 88   Resp 20   Wt 210 lb (95.3 kg)   SpO2 99% Comment: 2L O2 [place]  BMI 38.41 kg/m   Gen: NAD Heart; RRR,  + valve click Lungs: CTA bilaterally Ext: trace edema Incisions: well healed  A/P:  S/P Mechanical AVR- good valve click however, patient has not had INR check since she was discharged from hospital.  We reached out to [name] office however, being she has not been established there they were unwilling to check today.. I will send patient for PT/INR.Marland Kitchen she is scheduled to see Dr. Aggie Cosier on 5/13 Pulm- no acute issues, uses oxygen chronically at home.. they did not get CXR prior to appointment, they will get one at [facility]- will have our office reach out to see if additional appointments can be arranged Activity- re-discussed sternal precautions, encouraged to continue ambulating as tolerated Pain control- provided refill for Tramadol, encouraged patient to use Tylenol for less severe pain RTC in 3 weeks with PWW  Lowella Dandy, PA-C [facility] [phone]

## 2022-06-22 NOTE — Progress Notes (Addendum)
[address]       [place] [address]             [phone]     8 Days Post-Op Procedure(s) (LRB): AORTIC VALVE REPLACEMENT (AVR) USING SJM [place] MECHANICAL HEART VALVE SIZE (N/A) TRANSESOPHAGEAL ECHOCARDIOGRAM (N/A) Subjective: Feels ok  Objective: Vital signs in last 24 hours: Temp:  [98 F (36.7 C)-98.4 F (36.9 C)] 98.4 F (36.9 C) (05/01 0645) Pulse Rate:  [75-86] 85 (05/01 0645) Cardiac Rhythm: Normal sinus rhythm (04/30 2020) Resp:  [14-17] 14 (05/01 0109) BP: (125-169)/(59-71) 137/71 (05/01 0109) SpO2:  [92 %-96 %] 92 % (05/01 0645)  Hemodynamic parameters for last 24 hours:    Intake/Output from previous day: No intake/output data recorded. Intake/Output this shift: No intake/output data recorded.  General appearance: alert, cooperative, and no distress Heart: regular rate and rhythm Lungs: mildly  dim in bases Abdomen: benign Extremities: minor edema Wound: incis healing well  Lab Results: Recent Labs    06/20/22 0130 06/21/22 0112  WBC 16.1* 16.5*  HGB 8.1* 7.8*  HCT 25.0* 23.7*  PLT 131* 149*   BMET:  Recent Labs    06/20/22 0130 06/21/22 0112  NA 134* 132*  K 4.5 4.9  CL 95* 91*  CO2 29 27  GLUCOSE 180* 248*  BUN 62* 60*  CREATININE 1.83* 1.73*  CALCIUM 8.8* 8.9    PT/INR:  Recent Labs    06/22/22 0211  LABPROT 16.7*  INR 1.4*   ABG    Component Value Date/Time   PHART 7.299 (L) 06/14/2022 2116   HCO3 23.5 06/14/2022 2116   TCO2 25 06/14/2022 2116   ACIDBASEDEF 3.0 (H) 06/14/2022 2116   O2SAT 98 06/14/2022 2116   CBG (last 3)  Recent Labs    06/21/22 1734 06/21/22 2137 06/22/22 0606  GLUCAP 201* 159* 126*    Meds Scheduled Meds:  aspirin EC  81 mg Oral Daily   atorvastatin  40 mg Oral QHS   bisacodyl  10 mg Oral Daily   Or   bisacodyl  10 mg Rectal Daily   docusate sodium  200 mg Oral Daily   enoxaparin (LOVENOX) injection  40 mg Subcutaneous Q24H   Fe Fum-Vit C-Vit B12-FA  1  capsule Oral QPC breakfast   furosemide  40 mg Oral Daily   insulin aspart  0-24 Units Subcutaneous TID WC   insulin detemir  25 Units Subcutaneous Q12H   mouth rinse  15 mL Mouth Rinse 4 times per day   pantoprazole  40 mg Oral Daily   polyethylene glycol  17 g Oral Daily   sodium chloride flush  10-40 mL Intracatheter Q12H   sodium chloride flush  3 mL Intravenous Q12H   Warfarin - Physician Dosing Inpatient   Does not apply q1600   Continuous Infusions:  sodium chloride     PRN Meds:.dextrose, ondansetron (ZOFRAN) IV, mouth rinse, oxyCODONE, phenol, sodium chloride flush, sodium chloride flush, traMADol  Xrays No results found.  Assessment/Plan: S/P Procedure(s) (LRB): AORTIC VALVE REPLACEMENT (AVR) USING SJM [place] MECHANICAL HEART VALVE SIZE (N/A) TRANSESOPHAGEAL ECHOCARDIOGRAM (N/A)  1 afeb, VSS S BP 125-169 range- will resume home norvasc 2 sats good on RA-wears 2 liters at night but does desat at times with ambulation- will see if qualifies for home O2 3 UOP not all measured, weight 90 kg (trending lower) 4 some BS readings in 200's-gradual transition to home dosing at d/c  6 INR 1.4- will  cont coumadin at 7.5 for today 7 poss home later today if all arrangements in place    LOS: 8 days   Addendum: This patient did qualify for home oxygen and arrangements are being made.  She will be discharged later this afternoon. Rowe Clack PA-C Pager [phone] 06/22/2022

## 2022-06-22 NOTE — Plan of Care (Signed)
DISCHARGE NOTE HOME Chelsea English to be discharged home per MD order. Discussed prescriptions and follow up appointments with the patient's daughter. Medication list explained.   Skin clean, dry and intact without evidence of skin break down, no evidence of skin tears noted. IV catheter discontinued intact. Site without signs and symptoms of complications.  Patient free of lines, drains, and wounds.   An After Visit Summary (AVS) was printed and given to the patient. Patient escorted via wheelchair, and discharged home via private auto.  Arlice Colt, RN

## 2022-06-22 NOTE — Progress Notes (Signed)
CARDIAC REHAB PHASE I   Pt sitting in chair, feeling well today. Pt has been oob in room, ate breakfast and daughter is about to assist with bath. Post OHS home education including site care, restrictions, risk factors, heart healthy diabetic diet, sternal precautions, IS use at home, home needs at discharge, exercise guidelines and CRP2 reviewed with pt and daughters. All questions and concerns addressed. Will refer to [facility] for CRP2. Plan for home later today or tomorrow morning.   [phone]  Woodroe Chen, RN BSN 06/22/2022 9:59 AM

## 2022-06-22 NOTE — Patient Instructions (Signed)

## 2022-06-22 NOTE — Progress Notes (Signed)
SATURATION QUALIFICATIONS: (This note is used to comply with regulatory documentation for home oxygen)  Patient Saturations on Room Air at Rest = 87%  Patient Saturations on Room Air while Ambulating = 86%  Patient Saturations on 2 Liters of oxygen while Ambulating = 89% and greater  Please briefly explain why patient needs home oxygen: Pt hypoxic on RA with exertional tasks (stairs per home set-up), needed 2L/min O2 [place] to maintain SpO2 goal of 88%.

## 2022-06-22 NOTE — Discharge Instructions (Signed)
Information on my medicine - Coumadin   (Warfarin)  This medication education was reviewed with me or my healthcare representative as part of my discharge preparation.    Why was Coumadin prescribed for you? Coumadin was prescribed for you because you have a blood clot or a medical condition that can cause an increased risk of forming blood clots. Blood clots can cause serious health problems by blocking the flow of blood to the heart, lung, or brain. Coumadin can prevent harmful blood clots from forming. As a reminder your indication for Coumadin is:  Blood Clot Prevention after Heart Valve Surgery  What test will check on my response to Coumadin? While on Coumadin (warfarin) you will need to have an INR test regularly to ensure that your dose is keeping you in the desired range. The INR (international normalized ratio) number is calculated from the result of the laboratory test called prothrombin time (PT).  If an INR APPOINTMENT HAS NOT ALREADY BEEN MADE FOR YOU please schedule an appointment to have this lab work done by your health care provider within 7 days. Your INR goal is usually a number between:  2 to 3 or your provider may give you a more narrow range like 2-2.5.  Ask your health care provider during an office visit what your goal INR is.  What  do you need to  know  About  COUMADIN? Take Coumadin (warfarin) exactly as prescribed by your healthcare provider about the same time each day.  DO NOT stop taking without talking to the doctor who prescribed the medication.  Stopping without other blood clot prevention medication to take the place of Coumadin may increase your risk of developing a new clot or stroke.  Get refills before you run out.  What do you do if you miss a dose? If you miss a dose, take it as soon as you remember on the same day then continue your regularly scheduled regimen the next day.  Do not take two doses of Coumadin at the same time.  Important Safety  Information A possible side effect of Coumadin (Warfarin) is an increased risk of bleeding. You should call your healthcare provider right away if you experience any of the following: Bleeding from an injury or your nose that does not stop. Unusual colored urine (red or dark brown) or unusual colored stools (red or black). Unusual bruising for unknown reasons. A serious fall or if you hit your head (even if there is no bleeding).  Some foods or medicines interact with Coumadin (warfarin) and might alter your response to warfarin. To help avoid this: Eat a balanced diet, maintaining a consistent amount of Vitamin K. Notify your provider about major diet changes you plan to make. Avoid alcohol or limit your intake to 1 drink for women and 2 drinks for men per day. (1 drink is 5 oz. wine, 12 oz. beer, or 1.5 oz. liquor.)  Make sure that ANY health care provider who prescribes medication for you knows that you are taking Coumadin (warfarin).  Also make sure the healthcare provider who is monitoring your Coumadin knows when you have started a new medication including herbals and non-prescription products.  Coumadin (Warfarin)  Major Drug Interactions  Increased Warfarin Effect Decreased Warfarin Effect  Alcohol (large quantities) Antibiotics (esp. Septra/Bactrim, Flagyl, Cipro) Amiodarone (Cordarone) Aspirin (ASA) Cimetidine (Tagamet) Megestrol (Megace) NSAIDs (ibuprofen, naproxen, etc.) Piroxicam (Feldene) Propafenone (Rythmol SR) Propranolol (Inderal) Isoniazid (INH) Posaconazole (Noxafil) Barbiturates (Phenobarbital) Carbamazepine (Tegretol) Chlordiazepoxide (Librium) Cholestyramine (Questran) Griseofulvin   Oral Contraceptives Rifampin Sucralfate (Carafate) Vitamin K   Coumadin (Warfarin) Major Herbal Interactions  Increased Warfarin Effect Decreased Warfarin Effect  Garlic Ginseng Ginkgo biloba Coenzyme Q10 Green tea St. John's wort    Coumadin (Warfarin) FOOD  Interactions  Eat a consistent number of servings per week of foods HIGH in Vitamin K (1 serving =  cup)  Collards (cooked, or boiled & drained) Kale (cooked, or boiled & drained) Mustard greens (cooked, or boiled & drained) Parsley *serving size only =  cup Spinach (cooked, or boiled & drained) Swiss chard (cooked, or boiled & drained) Turnip greens (cooked, or boiled & drained)  Eat a consistent number of servings per week of foods MEDIUM-HIGH in Vitamin K (1 serving = 1 cup)  Asparagus (cooked, or boiled & drained) Broccoli (cooked, boiled & drained, or raw & chopped) Brussel sprouts (cooked, or boiled & drained) *serving size only =  cup Lettuce, raw (green leaf, endive, romaine) Spinach, raw Turnip greens, raw & chopped   These websites have more information on Coumadin (warfarin):  www.coumadin.com; www.ahrq.gov/consumer/coumadin.htm;   

## 2022-06-23 ENCOUNTER — Ambulatory Visit: Payer: Self-pay

## 2022-06-23 MED FILL — Electrolyte-R (PH 7.4) Solution: INTRAVENOUS | Qty: 4000 | Status: AC

## 2022-06-23 MED FILL — Heparin Sodium (Porcine) Inj 1000 Unit/ML: INTRAMUSCULAR | Qty: 10 | Status: AC

## 2022-06-23 MED FILL — Sodium Chloride IV Soln 0.9%: INTRAVENOUS | Qty: 2000 | Status: AC

## 2022-06-23 MED FILL — Mannitol IV Soln 20%: INTRAVENOUS | Qty: 500 | Status: AC

## 2022-06-27 ENCOUNTER — Other Ambulatory Visit: Payer: Self-pay | Admitting: Thoracic Surgery (Cardiothoracic Vascular Surgery)

## 2022-06-27 DIAGNOSIS — Z952 Presence of prosthetic heart valve: Secondary | ICD-10-CM

## 2022-06-30 ENCOUNTER — Ambulatory Visit (INDEPENDENT_AMBULATORY_CARE_PROVIDER_SITE_OTHER): Payer: Self-pay | Admitting: Physician Assistant

## 2022-06-30 ENCOUNTER — Other Ambulatory Visit: Payer: Self-pay | Admitting: Physician Assistant

## 2022-06-30 ENCOUNTER — Ambulatory Visit (HOSPITAL_BASED_OUTPATIENT_CLINIC_OR_DEPARTMENT_OTHER)
Admission: RE | Admit: 2022-06-30 | Discharge: 2022-06-30 | Disposition: A | Discharge status: Home / Self Care | Payer: Medicaid Other | Source: Ambulatory Visit | Attending: Physician Assistant | Admitting: Physician Assistant

## 2022-06-30 VITALS — BP 150/75 | HR 88 | Resp 20 | Wt 210.0 lb

## 2022-06-30 DIAGNOSIS — Z952 Presence of prosthetic heart valve: Secondary | ICD-10-CM

## 2022-06-30 MED ORDER — TRAMADOL HCL 50 MG PO TABS: 50.0000 mg | ORAL_TABLET | Freq: Four times a day (QID) | ORAL | 0 refills | Status: DC | PRN

## 2022-07-01 ENCOUNTER — Encounter: Payer: Self-pay | Admitting: Physician Assistant

## 2022-07-01 LAB — PROTIME-INR
INR: 5.1 — ABNORMAL HIGH
Prothrombin Time: 49 s — ABNORMAL HIGH (ref 9.0–11.5)

## 2022-07-01 NOTE — Progress Notes (Signed)
Encounter made in error. 

## 2022-07-01 NOTE — Progress Notes (Signed)
Chelsea English was seen in the office on 5/9.Marland Kitchen She had not had PT/INR level checked for her Mechanical AVR.  I sent her for lab draw, result was 5.1.  She will be instructed to hold coumadin for the next 3 days, with planned repeat INR on Monday 5/13 at [name] office   Lowella Dandy, PA-C 9:04 AM

## 2022-07-04 ENCOUNTER — Encounter: Payer: Self-pay | Admitting: Cardiology

## 2022-07-04 ENCOUNTER — Ambulatory Visit: Payer: Medicaid Other | Admitting: Cardiology

## 2022-07-04 VITALS — BP 174/81 | HR 100 | Resp 16 | Ht 63.0 in | Wt 207.0 lb

## 2022-07-04 DIAGNOSIS — E782 Mixed hyperlipidemia: Secondary | ICD-10-CM

## 2022-07-04 DIAGNOSIS — I10 Essential (primary) hypertension: Secondary | ICD-10-CM

## 2022-07-04 DIAGNOSIS — J9611 Chronic respiratory failure with hypoxia: Secondary | ICD-10-CM

## 2022-07-04 DIAGNOSIS — I350 Nonrheumatic aortic (valve) stenosis: Secondary | ICD-10-CM

## 2022-07-04 DIAGNOSIS — E7841 Elevated Lipoprotein(a): Secondary | ICD-10-CM

## 2022-07-04 DIAGNOSIS — Z952 Presence of prosthetic heart valve: Secondary | ICD-10-CM

## 2022-07-04 DIAGNOSIS — N1832 Chronic kidney disease, stage 3b: Secondary | ICD-10-CM

## 2022-07-04 LAB — POCT INR: INR: 2.1 (ref 2.0–3.0)

## 2022-07-04 NOTE — Progress Notes (Signed)
Follow up visit  Subjective:   Chelsea English, female    DOB: 16-Jun-1960, 62 y.o.   MRN: [id number]   HPI  Chief Complaint  Patient presents with   Severe aortic stenosis   New Patient (Initial Visit)        62 year old [place] female with hypertension, hyperlipidemia, type 2 diabetes mellitus, diabetic retinopathy w. left eye blindness, chronic kidney disease, severe aortic stenosis now s/p SAVR (21mm St Jude Mechanical prosthesis), elevated lipoprotein (a).  Patient's daughter Luci Bank, who is also an Charity fundraiser, assisted with language depression.  Patient was seen by me in 03/2022 for consultation during her hospitalization with acute on chronic HFpEF secondary to newly diagnosed severe aortic stenosis.  Patient since underwent successful surgical aortic valve replacement with a 21 mm Saint Jude mechanical prosthetic valve (06/15/2022).  She required temporary pacing postoperatively, but had improved conduction thereafter and did not require permanent pacemaker placement.  On a separate note, she was found to have elevated lipoprotein a.   Patient is slowly getting stronger since discharge home after her aortic valve replacement.  Her hospital course was complicated by AKI, that has significantly improved.  Patient also had temporary pacemaker placed due to AV conduction abnormalities, which improved, and subsequently did not require permanent pacemaker.  She is continue to require supplemental oxygen since her surgery.  Without oxygen, her sats dropped to low 90s at rest.  She has not seen pulmonology outpatient.  She is working with physical therapy, but feels she needs another 2 weeks of physical therapy to get to an independent ambulatory level.  She will start cardiac rehab in near future.  Blood pressure is elevated today.  Blood pressure is running around SBP 140s at home.  Her carvedilol and losartan were stopped during hospitalization.    Current Outpatient Medications:    amLODipine  (NORVASC) 5 MG tablet, Take 5 mg by mouth daily., Disp: , Rfl:    aspirin 81 MG chewable tablet, Chew 81 mg by mouth daily., Disp: , Rfl:    atorvastatin (LIPITOR) 40 MG tablet, Take 40 mg by mouth at bedtime., Disp: , Rfl:    ferrous sulfate 325 (65 FE) MG tablet, Take 650 mg by mouth daily with breakfast., Disp: , Rfl:    furosemide (LASIX) 40 MG tablet, Take 1 tablet (40 mg total) by mouth daily., Disp: 14 tablet, Rfl: 0   gabapentin (NEURONTIN) 100 MG capsule, Take 100 mg by mouth daily., Disp: , Rfl:    insulin NPH-regular Human (HUMULIN 70/30) (70-30) 100 UNIT/ML injection, Inject 40-60 Units into the skin See admin instructions. Injecting 40 units in the morning and 20 units at bedtime.adjust to previous dose if sugars elevated (Patient taking differently: Inject 40-60 Units into the skin See admin instructions. Injecting 60 units in the morning and 40 units at bedtime.adjust to previous dose if sugars elevated), Disp: 10 mL, Rfl: 11   JANUVIA 100 MG tablet, Take 100 mg by mouth daily., Disp: , Rfl:    Multiple Vitamins-Minerals (MULTI FOR HER 50+ PO), Take 1 tablet by mouth daily., Disp: , Rfl:    polyethylene glycol (MIRALAX / GLYCOLAX) 17 g packet, Take 17 g by mouth daily as needed for moderate constipation., Disp: , Rfl:    traMADol (ULTRAM) 50 MG tablet, Take 1 tablet (50 mg total) by mouth every 6 (six) hours as needed., Disp: 40 tablet, Rfl: 0   warfarin (COUMADIN) 5 MG tablet, Take 1.5 tablets (7.5 mg total) by mouth one time only  at 4 PM. Dose as adjusted per the coumadin clinic at cardiology office, Disp: 100 tablet, Rfl: 1   BD INSULIN SYRINGE U/F 31G X 5/16" 1 ML MISC, Inject 1 each into the skin 3 (three) times daily. Use to inject unsulin, Disp: , Rfl:    Cardiovascular & other pertient studies:  Reviewed external labs and tests, independently interpreted  EKG 07/04/2022: Sinus rhythm 98 bpm First degree A-V block Occasional PAC   Low voltage in precordial leads Left axis  deviation Nonspecific T-abnormality   Recent labs: 06/21/2022: Glucose 248, BUN/Cr 60/1.73. EGFR 33. Na/K 132/4.9.  H/H 7.8/23.7. MCV 84. Platelets 149. WBC 16k HbA1C 9.9% Lipoprotein (a) 441  06/2021: Chol 152, TG 156, HDL 27, LDL 94   Review of Systems  Constitutional: Positive for malaise/fatigue.  Cardiovascular:  Positive for dyspnea on exertion. Negative for chest pain, leg swelling, palpitations and syncope.        Vitals:   07/04/22 1121  BP: (!) 168/71  Pulse: 99  Resp: 16  SpO2: 97%    Body mass index is 36.67 kg/m. Filed Weights   07/04/22 1121  Weight: 207 lb (93.9 kg)     Objective:   Physical Exam Vitals and nursing note reviewed.  Constitutional:      General: She is not in acute distress. Neck:     Vascular: No JVD.  Cardiovascular:     Rate and Rhythm: Normal rate and regular rhythm.     Heart sounds: No murmur heard.    Comments: Metallic S2 Pulmonary:     Effort: Pulmonary effort is normal.     Breath sounds: Normal breath sounds. No wheezing or rales.  Musculoskeletal:     Right lower leg: No edema.     Left lower leg: No edema.             Visit diagnoses:   ICD-10-CM   1. Severe aortic stenosis  I35.0 EKG 12-Lead    Ambulatory referral to Physical Therapy    2. Status post mechanical aortic valve replacement  Z95.2 POCT INR    3. Essential hypertension  I10 Basic metabolic panel    Basic metabolic panel    Basic metabolic panel    4. Elevated lipoprotein A level  E78.41     5. Mixed hyperlipidemia  E78.2 Lipid panel    6. Stage 3b chronic kidney disease (HCC)  N18.32 Urine Microalbumin w/creat. ratio    7. Chronic hypoxic respiratory failure (HCC)  J96.11 Ambulatory referral to Physical Therapy    Ambulatory referral to Pulmonology       No orders of the defined types were placed in this encounter.    Medication changes this visit: There are no discontinued medications.  No orders of the defined types were  placed in this encounter.    Assessment & Recommendations:   62 y.o. [place] female with hypertension, hyperlipidemia, type 2 diabetes mellitus, diabetic retinopathy w. 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 Now s/p mechanical AVR with 21 mm Saint Jude valve. Valve functioning well on follow-up echocardiogram on 06/16/2022. Goal: 2.5 Indication: Mechnical AVR Today's INR: 2.1 Current dose:7.5 mg daily, on hold since 5/9 when INR was 5.1  New dose: 5 mg daily  Next INR check: 1 week With continued deconditioning, she would benefit from additional physical therapy, followed by cardiac rehab.  Chronic hypoxic respiratory failure: Initially,  she only had nocturnal hypoxemia, which has now progressed to oxygen requirement throughout the day.  This has persisted and spite of aortic valve replacement.  There may be some perioperative deconditioning contributing.  Will refer to pulmonology.  I am optimistic that she can be weaned off oxygen if there is no significant lung pathology.  Hypertension: Uncontrolled.  Check BMP today.  If creatinine further improved, will add losartan 25 mg daily and repeat BMP in 1 week.  CKD 3B: Recent AKI, now improved.  Will check urine albumin creatinine ratio and consider adding [place].  She would benefit from endocrinology and nephrology consultation.  Defer to PCP.  Elevated lipoprotein (a): 441, possibly contributing to severe aortic stenosis.  Discussed participation in clinical trial, she wants to hold off for now.  F/u in 4 weeks  Time spent: 40-minute    [name], MD Pager: [phone] Office: [phone]

## 2022-07-06 LAB — LIPID PANEL
Chol/HDL Ratio: 3.3 ratio (ref 0.0–4.4)
Cholesterol, Total: 123 mg/dL (ref 100–199)
HDL: 37 mg/dL — ABNORMAL LOW (ref >39)
LDL Chol Calc (NIH): 69 mg/dL (ref 0–99)
Triglycerides: 87 mg/dL (ref 0–149)
VLDL Cholesterol Cal: 17 mg/dL (ref 5–40)

## 2022-07-06 LAB — BASIC METABOLIC PANEL
BUN/Creatinine Ratio: 25 (ref 12–28)
BUN: 40 mg/dL — ABNORMAL HIGH (ref 8–27)
CO2: 23 mmol/L (ref 20–29)
Calcium: 9.4 mg/dL (ref 8.7–10.3)
Chloride: 101 mmol/L (ref 96–106)
Creatinine, Ser: 1.57 mg/dL — ABNORMAL HIGH (ref 0.57–1.00)
Glucose: 56 mg/dL — ABNORMAL LOW (ref 70–99)
Potassium: 4.4 mmol/L (ref 3.5–5.2)
Sodium: 140 mmol/L (ref 134–144)
eGFR: 37 mL/min/{1.73_m2} — ABNORMAL LOW (ref >59)

## 2022-07-06 LAB — MICROALBUMIN / CREATININE URINE RATIO
Creatinine, Urine: 41.3 mg/dL
Microalb/Creat Ratio: 666 mg/g creat — ABNORMAL HIGH (ref 0–29)
Microalbumin, Urine: 275.2 ug/mL

## 2022-07-06 MED ORDER — KERENDIA 10 MG PO TABS: 10.0000 mg | ORAL_TABLET | Freq: Every day | ORAL | 3 refills | Status: DC

## 2022-07-06 NOTE — Addendum Note (Signed)
Addended by: [name] on: 07/06/2022 01:47 PM   Modules accepted: Orders

## 2022-07-11 ENCOUNTER — Ambulatory Visit: Payer: Medicaid Other | Admitting: Cardiology

## 2022-07-11 DIAGNOSIS — Z952 Presence of prosthetic heart valve: Secondary | ICD-10-CM

## 2022-07-11 LAB — POCT INR: POC INR: 4.4

## 2022-07-11 NOTE — Progress Notes (Signed)
Description   07/11/2022  INR today was 4.4. Patient goal level is (2.0-3.0)  Prior dose was 5 mg every day.  patient will now start back on 2.5 mg Monday, Thursday, Saturday, and 5 mg all the other days.  Pt will recheck INR 1 weeks on 07/20/2022.  Lab Results      Component                Value               Date                      INR                      4.4                 07/11/2022                INR                      2.1                 07/04/2022                INR                      5.1 (H)             06/30/2022            S/P AVR with 21mm St Jude Mechanical prosthesis 06/15/2022  (primary encounter diagnosis) Plan: POCT INR

## 2022-07-20 ENCOUNTER — Ambulatory Visit: Payer: Medicaid Other | Admitting: Cardiology

## 2022-07-20 ENCOUNTER — Ambulatory Visit: Payer: Medicaid Other

## 2022-07-20 DIAGNOSIS — Z952 Presence of prosthetic heart valve: Secondary | ICD-10-CM

## 2022-07-20 LAB — POCT INR: INR: 2.6 (ref 2.0–3.0)

## 2022-07-20 NOTE — Progress Notes (Signed)
Description   07/20/2022    INR today was 2.6. Patient goal level is (2.0-3.0)  Prior dose was 2.5 mg Monday, Thursday, Saturday, and 5 mg all the other days.  Patient will continue 2.5 mg Monday, Thursday, Saturday, and 5 mg all the other days.   Pt will recheck INR 2 weeks on 06/12.   Lab Results      Component                Value               Date                      INR                      2.6                 07/20/2022                INR                      4.4                 07/11/2022                INR                      2.1                 07/04/2022               S/P AVR with 21mm St Jude Mechanical prosthesis 06/15/2022  (primary encounter diagnosis) Plan: POCT INR

## 2022-07-24 NOTE — Progress Notes (Signed)
[address]       [place] [address]             [phone]           Chelsea English [facility] Medical Record #[id number] Date of Birth: 1960-04-10  [name], MD [name], MD  Chief Complaint:   Follow up Mechanical AVR  History of Present Illness:     Pt sp Mechanical AVR for severe AS. Pt has done remarkably well at home with feeling now better than she was before surgery. Daughter reports she is going up and down stairs and overall has better color and stamina. She is down to 1 liter O2 at home with plans on seeing pulmonary next several weeks to hopefully be off oxygen in a few weeks She is eating and sleeping ok. No significant chest discomfort      Past Medical History:  Diagnosis Date   Anemia    Aortic stenosis    Coronary artery disease    Diabetes mellitus without complication (HCC)    Heart murmur    Hyperlipidemia    Hypertension    Oxygen dependent    2L at night   S/P AVR with 21mm St Jude Mechanical prosthesis 06/15/2022 06/14/2022    Past Surgical History:  Procedure Laterality Date   AORTIC VALVE REPLACEMENT N/A 06/14/2022   Procedure: AORTIC VALVE REPLACEMENT (AVR) USING SJM REGENT MECHANICAL HEART VALVE SIZE ;  Surgeon: Eugenio Hoes, MD;  Location: [facility] OR;  Service: Open Heart Surgery;  Laterality: N/A;   ARTERY BIOPSY Right 07/20/2021   Procedure: BIOPSY OF RIGHT TEMPORAL ARTERY;  Surgeon: Chuck Hint, MD;  Location: [facility] OR;  Service: Vascular;  Laterality: Right;   cataract Right    HYSTEROSCOPY WITH D & C     removal of polyp   MULTIPLE TOOTH EXTRACTIONS     no teeth, no dentures   RIGHT AND LEFT HEART CATH N/A 04/19/2022   Procedure: RIGHT AND LEFT HEART CATH;  Surgeon: [name], MD;  Location: [facility] INVASIVE CV LAB;  Service: Cardiovascular;  Laterality: N/A;   TEE WITHOUT CARDIOVERSION N/A 06/14/2022   Procedure: TRANSESOPHAGEAL ECHOCARDIOGRAM;  Surgeon: Eugenio Hoes, MD;   Location: [facility] OR;  Service: Open Heart Surgery;  Laterality: N/A;    Social History   Tobacco Use  Smoking Status Never  Smokeless Tobacco Never    Social History   Substance and Sexual Activity  Alcohol Use Never    Social History   Socioeconomic History   Marital status: Widowed    Spouse name: Not on file   Number of children: 4   Years of education: Not on file   Highest education level: Not on file  Occupational History   Not on file  Tobacco Use   Smoking status: Never   Smokeless tobacco: Never  Vaping Use   Vaping Use: Never used  Substance and Sexual Activity   Alcohol use: Never   Drug use: Never   Sexual activity: Not Currently    Birth control/protection: Post-menopausal  Other Topics Concern   Not on file  Social History Narrative   Not on file   Social Determinants of Health   Financial Resource Strain: Not on file  Food Insecurity: No Food Insecurity (06/16/2022)   Hunger Vital Sign    Worried About Running Out of Food in the Last Year: Never true    Ran Out of Food in the Last Year: Never  true  Transportation Needs: No Transportation Needs (06/16/2022)   PRAPARE - Administrator, Civil Service (Medical): No    Lack of Transportation (Non-Medical): No  Physical Activity: Not on file  Stress: Not on file  Social Connections: Not on file  Intimate Partner Violence: Not At Risk (06/16/2022)   Humiliation, Afraid, Rape, and Kick questionnaire    Fear of Current or Ex-Partner: No    Emotionally Abused: No    Physically Abused: No    Sexually Abused: No    Allergies  Allergen Reactions   Penicillins Anaphylaxis    Current Outpatient Medications  Medication Sig Dispense Refill   amLODipine (NORVASC) 5 MG tablet Take 5 mg by mouth daily.     aspirin 81 MG chewable tablet Chew 81 mg by mouth daily.     atorvastatin (LIPITOR) 40 MG tablet Take 40 mg by mouth at bedtime.     BD INSULIN SYRINGE U/F 31G X 5/16" 1 ML MISC Inject 1 each  into the skin 3 (three) times daily. Use to inject unsulin     ferrous sulfate 325 (65 FE) MG tablet Take 650 mg by mouth daily with breakfast.     Finerenone (KERENDIA) 10 MG TABS Take 1 tablet (10 mg total) by mouth daily. 30 tablet 3   furosemide (LASIX) 40 MG tablet Take 1 tablet (40 mg total) by mouth daily. 14 tablet 0   gabapentin (NEURONTIN) 100 MG capsule Take 100 mg by mouth daily.     insulin NPH-regular Human (HUMULIN 70/30) (70-30) 100 UNIT/ML injection Inject 40-60 Units into the skin See admin instructions. Injecting 40 units in the morning and 20 units at bedtime.adjust to previous dose if sugars elevated (Patient taking differently: Inject 40-60 Units into the skin See admin instructions. Injecting 60 units in the morning and 40 units at bedtime.adjust to previous dose if sugars elevated) 10 mL 11   JANUVIA 100 MG tablet Take 100 mg by mouth daily.     Multiple Vitamins-Minerals (MULTI FOR HER 50+ PO) Take 1 tablet by mouth daily.     polyethylene glycol (MIRALAX / GLYCOLAX) 17 g packet Take 17 g by mouth daily as needed for moderate constipation.     traMADol (ULTRAM) 50 MG tablet Take 1 tablet (50 mg total) by mouth every 6 (six) hours as needed. 40 tablet 0   warfarin (COUMADIN) 5 MG tablet Take 1.5 tablets (7.5 mg total) by mouth one time only at 4 PM. Dose as adjusted per the coumadin clinic at cardiology office 100 tablet 1   No current facility-administered medications for this visit.     Family History  Problem Relation Age of Onset   Diabetes Sister        Physical Exam: Lungs overall clear Card: RR with sharp valve click Wound well healed, sutures removed Ext: no edema Neuro: alert no focal deficits     Diagnostic Studies & Laboratory data: I have personally reviewed the following studies and agree with the findings     Recent Radiology Findings:       Recent Lab Findings: Lab Results  Component Value Date   WBC 16.5 (H) 06/21/2022   HGB 7.8 (L)  06/21/2022   HCT 23.7 (L) 06/21/2022   PLT 149 (L) 06/21/2022   GLUCOSE 56 (L) 07/05/2022   CHOL 123 07/05/2022   TRIG 87 07/05/2022   HDL 37 (L) 07/05/2022   LDLCALC 69 07/05/2022   ALT 38 06/10/2022   AST 32 06/10/2022  NA 140 07/05/2022   K 4.4 07/05/2022   CL 101 07/05/2022   CREATININE 1.57 (H) 07/05/2022   BUN 40 (H) 07/05/2022   CO2 23 07/05/2022   TSH 1.502 07/18/2021   INR 2.6 07/20/2022   HGBA1C 9.9 (H) 06/10/2022      Assessment / Plan:     SP mechanical AVR. Have reviewed restrictions and she understands no lifting over 15lbs for the total of 6 weeks from surgery and will have her see outpt cardiac rehab. INR theraputic. Will continue to follow with cardiology Follow up CTS prn   I have spent 20 min in review of the records, viewing studies and in face to face with patient and in coordination of future care    Eugenio Hoes 07/24/2022 5:23 PM

## 2022-07-25 ENCOUNTER — Other Ambulatory Visit: Payer: Self-pay | Admitting: Physician Assistant

## 2022-07-25 ENCOUNTER — Ambulatory Visit (INDEPENDENT_AMBULATORY_CARE_PROVIDER_SITE_OTHER): Payer: Self-pay | Admitting: Thoracic Surgery (Cardiothoracic Vascular Surgery)

## 2022-07-25 ENCOUNTER — Encounter: Payer: Self-pay | Admitting: Thoracic Surgery (Cardiothoracic Vascular Surgery)

## 2022-07-25 VITALS — BP 146/85 | HR 98 | Resp 20 | Ht 63.0 in | Wt 198.0 lb

## 2022-07-25 DIAGNOSIS — Z952 Presence of prosthetic heart valve: Secondary | ICD-10-CM

## 2022-07-25 NOTE — Patient Instructions (Signed)
Out pt cardiac rehab Follow up with [place] PRN

## 2022-07-26 ENCOUNTER — Telehealth (HOSPITAL_COMMUNITY): Payer: Self-pay | Admitting: *Deleted

## 2022-07-26 NOTE — Telephone Encounter (Signed)
Patwardhan, Anabel Bene, MD  Chelsea Aus, RN 94% or above.  Thanks MJP       Previous Messages    ----- Message ----- From: Chelsea Aus, RN Sent: 07/25/2022  12:29 PM EDT To: [name], MD Subject: Oxygen therapy during Cardiac Rehab             The above pt, who is s/p AV Replacement on 06/14/22, completed her follow up appt with Dr. Leafy Ro on 6/3.  Post surgical recovery, [place] qualified for home O2. Currently uses 1LNC continuously and has follow up with Dr. Wynona Neat on 6/12. We will monitor her O2 saturation pre/post and during exercise.  What is an acceptable O2 saturation for Hale Bogus to maintain?  Ok to titrate oxygen therapy?  Thank you for you input  Vincy Feliz Industrial/product designer, BSN Cardiac and Pulmonary Rehab Nurse Navigator

## 2022-07-27 ENCOUNTER — Encounter (HOSPITAL_COMMUNITY): Payer: Self-pay

## 2022-07-27 ENCOUNTER — Telehealth (HOSPITAL_COMMUNITY): Payer: Self-pay

## 2022-07-27 NOTE — Telephone Encounter (Signed)
Attempted to call patient in regards to Cardiac Rehab - LM on VM Mailed letter 

## 2022-08-03 ENCOUNTER — Ambulatory Visit (HOSPITAL_BASED_OUTPATIENT_CLINIC_OR_DEPARTMENT_OTHER): Payer: Medicaid Other | Attending: Pulmonary Disease | Admitting: Pulmonary Disease

## 2022-08-03 VITALS — Ht 63.0 in | Wt 198.0 lb

## 2022-08-03 DIAGNOSIS — G4733 Obstructive sleep apnea (adult) (pediatric): Secondary | ICD-10-CM | POA: Diagnosis not present

## 2022-08-03 DIAGNOSIS — G4736 Sleep related hypoventilation in conditions classified elsewhere: Secondary | ICD-10-CM | POA: Insufficient documentation

## 2022-08-03 DIAGNOSIS — R0683 Snoring: Secondary | ICD-10-CM | POA: Insufficient documentation

## 2022-08-09 NOTE — Progress Notes (Unsigned)
Follow up visit  Subjective:   Chelsea English, female    DOB: Dec 21, 1960, 62 y.o.   MRN: [id number]   HPI  No chief complaint on file.  62 year old [place] female with hypertension, hyperlipidemia, type 2 diabetes mellitus, diabetic retinopathy w. left eye blindness, chronic kidney disease, severe aortic stenosis now s/p SAVR (21mm St Jude Mechanical prosthesis), elevated lipoprotein (a).  Patient's daughter Luci Bank, who is also an Charity fundraiser, assisted with language depression.  Patient was seen by me in 03/2022 for consultation during her hospitalization with acute on chronic HFpEF secondary to newly diagnosed severe aortic stenosis.  Patient since underwent successful surgical aortic valve replacement with a 21 mm Saint Jude mechanical prosthetic valve (06/15/2022).  She required temporary pacing postoperatively, but had improved conduction thereafter and did not require permanent pacemaker placement.  On a separate note, she was found to have elevated lipoprotein a.   Patient is slowly getting stronger since discharge home after her aortic valve replacement.  Her hospital course was complicated by AKI, that has significantly improved.  Patient also had temporary pacemaker placed due to AV conduction abnormalities, which improved, and subsequently did not require permanent pacemaker.  She is continue to require supplemental oxygen since her surgery.  Without oxygen, her sats dropped to low 90s at rest.  She has not seen pulmonology outpatient.  She is working with physical therapy, but feels she needs another 2 weeks of physical therapy to get to an independent ambulatory level.  She will start cardiac rehab in near future.  Blood pressure is elevated today.  Blood pressure is running around SBP 140s at home.  Her carvedilol and losartan were stopped during hospitalization.    Current Outpatient Medications:    amLODipine (NORVASC) 5 MG tablet, Take 5 mg by mouth daily., Disp: , Rfl:    aspirin 81  MG chewable tablet, Chew 81 mg by mouth daily., Disp: , Rfl:    atorvastatin (LIPITOR) 40 MG tablet, Take 40 mg by mouth at bedtime., Disp: , Rfl:    BD INSULIN SYRINGE U/F 31G X 5/16" 1 ML MISC, Inject 1 each into the skin 3 (three) times daily. Use to inject unsulin, Disp: , Rfl:    ferrous sulfate 325 (65 FE) MG tablet, Take 650 mg by mouth daily with breakfast., Disp: , Rfl:    Finerenone (KERENDIA) 10 MG TABS, Take 1 tablet (10 mg total) by mouth daily., Disp: 30 tablet, Rfl: 3   furosemide (LASIX) 40 MG tablet, Take 1 tablet (40 mg total) by mouth daily., Disp: 14 tablet, Rfl: 0   gabapentin (NEURONTIN) 100 MG capsule, Take 100 mg by mouth daily., Disp: , Rfl:    insulin NPH-regular Human (HUMULIN 70/30) (70-30) 100 UNIT/ML injection, Inject 40-60 Units into the skin See admin instructions. Injecting 40 units in the morning and 20 units at bedtime.adjust to previous dose if sugars elevated (Patient taking differently: Inject 40-60 Units into the skin See admin instructions. Injecting 60 units in the morning and 40 units at bedtime.adjust to previous dose if sugars elevated), Disp: 10 mL, Rfl: 11   JANUVIA 100 MG tablet, Take 100 mg by mouth daily., Disp: , Rfl:    Multiple Vitamins-Minerals (MULTI FOR HER 50+ PO), Take 1 tablet by mouth daily., Disp: , Rfl:    polyethylene glycol (MIRALAX / GLYCOLAX) 17 g packet, Take 17 g by mouth daily as needed for moderate constipation., Disp: , Rfl:    traMADol (ULTRAM) 50 MG tablet, Take 1 tablet (50  mg total) by mouth every 6 (six) hours as needed., Disp: 40 tablet, Rfl: 0   warfarin (COUMADIN) 5 MG tablet, Take 1.5 tablets (7.5 mg total) by mouth one time only at 4 PM. Dose as adjusted per the coumadin clinic at cardiology office, Disp: 100 tablet, Rfl: 1   Cardiovascular & other pertient studies:  Reviewed external labs and tests, independently interpreted  EKG 07/04/2022: Sinus rhythm 98 bpm First degree A-V block Occasional PAC   Low voltage in  precordial leads Left axis deviation Nonspecific T-abnormality   Recent labs: 06/21/2022: Glucose 248, BUN/Cr 60/1.73. EGFR 33. Na/K 132/4.9.  H/H 7.8/23.7. MCV 84. Platelets 149. WBC 16k HbA1C 9.9% Lipoprotein (a) 441  06/2021: Chol 152, TG 156, HDL 27, LDL 94   Review of Systems  Constitutional: Positive for malaise/fatigue.  Cardiovascular:  Positive for dyspnea on exertion. Negative for chest pain, leg swelling, palpitations and syncope.        There were no vitals filed for this visit.   There is no height or weight on file to calculate BMI. There were no vitals filed for this visit.    Objective:   Physical Exam Vitals and nursing note reviewed.  Constitutional:      General: She is not in acute distress. Neck:     Vascular: No JVD.  Cardiovascular:     Rate and Rhythm: Normal rate and regular rhythm.     Heart sounds: No murmur heard.    Comments: Metallic S2 Pulmonary:     Effort: Pulmonary effort is normal.     Breath sounds: Normal breath sounds. No wheezing or rales.  Musculoskeletal:     Right lower leg: No edema.     Left lower leg: No edema.             Visit diagnoses: No diagnosis found.    No orders of the defined types were placed in this encounter.    Medication changes this visit: There are no discontinued medications.  No orders of the defined types were placed in this encounter.    Assessment & Recommendations:   62 y.o. [place] female with hypertension, hyperlipidemia, type 2 diabetes mellitus, diabetic retinopathy w. 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 Now s/p mechanical AVR with 21 mm Saint Jude valve. Valve functioning well on follow-up echocardiogram on 06/16/2022. Goal: 2.5 Indication: Mechnical AVR Today's INR: *** Current dose: *** New dose: *** Next INR  check: ***  Chronic hypoxic respiratory failure: Initially, she only had nocturnal hypoxemia, which has now progressed to oxygen requirement throughout the day.  This has persisted and spite of aortic valve replacement.  There may be some perioperative deconditioning contributing.  Will refer to pulmonology.  I am optimistic that she can be weaned off oxygen if there is no significant lung pathology.  ***Hypertension: Uncontrolled.  Check BMP today.  If creatinine further improved, will add losartan 25 mg daily and repeat BMP in 1 week.  CKD 3B: Recent AKI, now improved.  Will check urine albumin creatinine ratio and consider adding [place].  She would benefit from endocrinology and nephrology consultation.  Defer to PCP.  Elevated lipoprotein (a): 441, possibly contributing to severe aortic stenosis.  Discussed participation in clinical trial, she wants to hold off for now.  F/u in ***    [name], MD Pager: [phone] Office: [phone]

## 2022-08-10 ENCOUNTER — Encounter: Payer: Self-pay | Admitting: Cardiology

## 2022-08-10 ENCOUNTER — Ambulatory Visit: Payer: Self-pay

## 2022-08-10 ENCOUNTER — Ambulatory Visit: Payer: Medicaid Other | Admitting: Cardiology

## 2022-08-10 VITALS — BP 159/74 | HR 95 | Ht 63.0 in | Wt 200.0 lb

## 2022-08-10 DIAGNOSIS — Z952 Presence of prosthetic heart valve: Secondary | ICD-10-CM

## 2022-08-10 DIAGNOSIS — I350 Nonrheumatic aortic (valve) stenosis: Secondary | ICD-10-CM

## 2022-08-10 DIAGNOSIS — I10 Essential (primary) hypertension: Secondary | ICD-10-CM

## 2022-08-10 LAB — POCT INR
INR: 1.3 — AB (ref 2.0–3.0)
INR: 1.3 — AB (ref 2.0–3.0)

## 2022-08-10 MED ORDER — ENOXAPARIN SODIUM 100 MG/ML IJ SOSY: 90.0000 mg | PREFILLED_SYRINGE | Freq: Two times a day (BID) | INTRAMUSCULAR | 1 refills | Status: DC

## 2022-08-10 MED ORDER — ENOXAPARIN SODIUM 40 MG/0.4ML IJ SOSY
90.0000 mg | PREFILLED_SYRINGE | Freq: Two times a day (BID) | INTRAMUSCULAR | 3 refills | Status: DC
Start: 2022-08-10 — End: 2022-08-10

## 2022-08-14 ENCOUNTER — Telehealth: Payer: Self-pay | Admitting: Pulmonary Disease

## 2022-08-14 DIAGNOSIS — R0683 Snoring: Secondary | ICD-10-CM | POA: Diagnosis not present

## 2022-08-14 NOTE — Telephone Encounter (Signed)
Call patient  Sleep study result  Date of study: 08/03/2022  Impression: Moderate obstructive sleep apnea Poor sleep efficiency Insomnia  Recommendation:  Schedule for follow-up to discuss findings on sleep study  Evaluation and management of insomnia  Will consider therapeutic CPAP titration to determine optimal pressure for treatment of sleep disordered breathing  Will benefit from CPAP therapy

## 2022-08-14 NOTE — Procedures (Signed)
POLYSOMNOGRAPHY  Last, First: Chelsea English, Chelsea English MRN: [id number] Gender: Female Age (years): 44 Weight (lbs): 198 DOB: 1960-05-20 BMI: 35 Primary Care: No PCP Epworth Score: 9 Referring: Tomma Lightning MD Technician: Shelah Lewandowsky Interpreting: Tomma Lightning MD Study Type: NPSG Ordered Study Type: Split Night CPAP Study date: 08/03/2022 Location: [facility] CLINICAL INFORMATION Chelsea English is a 62 year old Female and was referred to the sleep center for evaluation of G47.30 Sleep Apnea, Unspecified (780.57). Indications include Congestive Heart Failure, Diabetes, Excessive Daytime Sleepiness, Hypertension, Obesity, Snoring.  MEDICATIONS Patient self administered medications include: N/A. Medications administered during study include No sleep medicine administered.  SLEEP STUDY TECHNIQUE A multi-channel overnight Polysomnography study was performed. The channels recorded and monitored were central and occipital EEG, electrooculogram (EOG), submentalis EMG (chin), nasal and oral airflow, thoracic and abdominal wall motion, anterior tibialis EMG, snore microphone, electrocardiogram, and a pulse oximetry. TECHNICIAN COMMENTS Comments added by Technician: NONE Comments added by Scorer: N/A SLEEP ARCHITECTURE The study was initiated at 10:13:41 PM and terminated at 5:10:56 AM. The total recorded time was 417.3 minutes. EEG confirmed total sleep time was 156.5 minutes yielding a sleep efficiency of 37.5%. Sleep onset after lights out was 35.9 minutes with a REM latency of 213.5 minutes. The patient spent 11.8% of the night in stage N1 sleep, 67.7% in stage N2 sleep, 0.0% in stage N3 and 20.5% in REM. Wake after sleep onset (WASO) was 224.9 minutes. The Arousal Index was 22.6/hour. RESPIRATORY PARAMETERS There were a total of 47 respiratory disturbances out of which 5 were apneas ( 5 obstructive, 0 mixed, 0 central) and 42 hypopneas. The apnea/hypopnea index (AHI) was 18.0  events/hour. The central sleep apnea index was 0 events/hour. The REM AHI was 75.0 events/hour and NREM AHI was 3.4 events/hour. The supine AHI was 0.0 events/hour and the non supine AHI was 22.9 supine during 21.35% of sleep. Respiratory disturbances were associated with oxygen desaturation down to a nadir of 70.0% during sleep. The mean oxygen saturation during the study was 93.6%.   LEG MOVEMENT DATA The total leg movements were 0 with a resulting leg movement index of 0.0/hr .Associated arousal with leg movement index was 0.0/hr.  CARDIAC DATA The underlying cardiac rhythm was most consistent with sinus rhythm. Mean heart rate during sleep was 88.8 bpm. Additional rhythm abnormalities include PVCs.  IMPRESSIONS - Moderate Obstructive Sleep apnea(OSA) - Electrocardiographic data showed presence of PVCs. - Severe Oxygen Desaturation - The patient snored with moderate snoring volume. - Poor sleep efficiency - Insomnia - No significant periodic leg movements(PLMs) during sleep. However, no significant associated arousals.  DIAGNOSIS - Obstructive Sleep Apnea (G47.33) - Nocturnal Hypoxemia (G47.36)  RECOMMENDATIONS - Therapeutic CPAP titration to determine optimal pressure required to alleviate sleep disordered breathing. - Recommend evaluation and management of insomnia. - Will benefit from follow-up and discussion of study findings prior to scheduling for CPAP titration - Avoid alcohol, sedatives and other CNS depressants that may worsen sleep apnea and disrupt normal sleep architecture. - Sleep hygiene should be reviewed to assess factors that may improve sleep quality. - Weight management and regular exercise should be initiated or continued.  [Electronically signed] 08/14/2022 08:11 AM  Virl Diamond MD NPI: [id number]

## 2022-08-15 NOTE — Telephone Encounter (Signed)
Spoke with patient's daughter(Chelsea English) regarding sleep study result's  Call patient   Sleep study result   Date of study: 08/03/2022   Impression: Moderate obstructive sleep apnea Poor sleep efficiency Insomnia   Recommendation:   Schedule for follow-up to discuss findings on sleep study   Evaluation and management of insomnia   Will consider therapeutic CPAP titration to determine optimal pressure for treatment of sleep disordered breathing   Will benefit from CPAP therapy      Patient was scheduled for 08/26/22 at 4 pm to go over sleep study result's in office.   Chelsea English's voice was understanding.Nothing else further needed.

## 2022-08-17 ENCOUNTER — Ambulatory Visit: Payer: Medicaid Other | Admitting: Cardiology

## 2022-08-17 DIAGNOSIS — Z952 Presence of prosthetic heart valve: Secondary | ICD-10-CM

## 2022-08-17 LAB — POCT INR: INR: 2.1 (ref 2.0–3.0)

## 2022-08-17 NOTE — Progress Notes (Signed)
Description   08/17/2022    INR today was 2.1. Patient goal level is (2.0-3.0)  Prior dose was 2.5 mg Monday and 5 mg all the other days.  Patient will continue 2.5 mg Monday, and 5 mg all the other days. But will STOP lovenox 90mg  bid.  Pt will recheck INR 2 weeks on 08/31/22.   Lab Results      Component                Value               Date                      INR                      2.6                 07/20/2022                INR                      4.4                 07/11/2022                INR                      2.1                 07/04/2022               S/P AVR with 21mm St Jude Mechanical prosthesis 06/15/2022  (primary encounter diagnosis) Plan: POCT INR

## 2022-08-26 ENCOUNTER — Ambulatory Visit (INDEPENDENT_AMBULATORY_CARE_PROVIDER_SITE_OTHER): Payer: Medicaid Other | Admitting: Pulmonary Disease

## 2022-08-26 ENCOUNTER — Encounter: Payer: Self-pay | Admitting: Pulmonary Disease

## 2022-08-26 VITALS — BP 132/82 | HR 95 | Ht 63.0 in | Wt 203.2 lb

## 2022-08-26 DIAGNOSIS — G4733 Obstructive sleep apnea (adult) (pediatric): Secondary | ICD-10-CM | POA: Diagnosis not present

## 2022-08-26 MED ORDER — ESZOPICLONE 2 MG PO TABS: 2.0000 mg | ORAL_TABLET | Freq: Every evening | ORAL | 2 refills | Status: DC | PRN

## 2022-08-26 NOTE — Progress Notes (Signed)
Chelsea English    [id number]    08-06-1960  Primary Care Physician:Wofford, Fayrene Fearing, MD  Referring Physician: [name], MD [address] [place],  [place] [address]  Chief complaint:   Snoring, nonrestorative sleep  Patient's daughter and interpreter present, assisted with evaluation  HPI:  Recent sleep study shows moderate obstructive sleep apnea  With significant oxygen desaturations Study also reflected very poor sleep efficiency  Patient admits to difficulty maintaining sleep, difficulty initiating sleep sometimes only getting about 3 hours of sleep at night  She remains on oxygen supplementation at night  Had difficulty with a sleep study with being in a different environment  Snoring, nonrestorative sleep  Denies choking or coughing during sleep, denies palpitations at night Was recently hospitalized, managed for acute decompensated diastolic heart failure  Never smoked, was exposed to some secondhand smoke Denies underlying history of lung disease  History of cardiac rhythm problems, diabetes, hypercholesterolemia, allergy and sinus problems  Usually falls asleep at about 3 AM, wakes up about 12 noon Takes a very long time to fall asleep, will sometimes go to bed about 10 PM and not fall asleep till much later Wakes up about every hour, most of the time to use the bathroom  She does try to stay active during the day but does not do any routine exercises  She does have a history of aortic stenosis, history of diabetes, hypertension, stage IIIb chronic kidney disease, class I obesity, retinopathy  Outpatient Encounter Medications as of 08/26/2022  Medication Sig   amLODipine (NORVASC) 5 MG tablet Take 5 mg by mouth daily.   aspirin 81 MG chewable tablet Chew 81 mg by mouth daily.   atorvastatin (LIPITOR) 40 MG tablet Take 40 mg by mouth at bedtime.   BD INSULIN SYRINGE U/F 31G X 5/16" 1 ML MISC Inject 1 each into the skin 3 (three)  times daily. Use to inject unsulin   enoxaparin (LOVENOX) 100 MG/ML injection Inject 0.9 mLs (90 mg total) into the skin every 12 (twelve) hours.   eszopiclone (LUNESTA) 2 MG TABS tablet Take 1 tablet (2 mg total) by mouth at bedtime as needed for sleep. Take immediately before bedtime   ferrous sulfate 325 (65 FE) MG tablet Take 650 mg by mouth daily with breakfast.   Finerenone (KERENDIA) 10 MG TABS Take 1 tablet (10 mg total) by mouth daily.   furosemide (LASIX) 40 MG tablet Take 1 tablet (40 mg total) by mouth daily.   gabapentin (NEURONTIN) 100 MG capsule Take 100 mg by mouth daily.   insulin NPH-regular Human (HUMULIN 70/30) (70-30) 100 UNIT/ML injection Inject 40-60 Units into the skin See admin instructions. Injecting 40 units in the morning and 20 units at bedtime.adjust to previous dose if sugars elevated (Patient taking differently: Inject 40-60 Units into the skin See admin instructions. Injecting 60 units in the morning and 40 units at bedtime.adjust to previous dose if sugars elevated)   JANUVIA 100 MG tablet Take 100 mg by mouth daily.   Multiple Vitamins-Minerals (MULTI FOR HER 50+ PO) Take 1 tablet by mouth daily.   polyethylene glycol (MIRALAX / GLYCOLAX) 17 g packet Take 17 g by mouth daily as needed for moderate constipation.   warfarin (COUMADIN) 5 MG tablet Take 5 mg by mouth as directed. 2.5 mg M,Th,Sat, 5 mg all other days   [DISCONTINUED] traMADol (ULTRAM) 50 MG tablet Take 1 tablet (50 mg total) by mouth every 6 (six)  hours as needed.   No facility-administered encounter medications on file as of 08/26/2022.    Allergies as of 08/26/2022 - Review Complete 08/26/2022  Allergen Reaction Noted   Penicillins Anaphylaxis 07/17/2021    Past Medical History:  Diagnosis Date   Anemia    Aortic stenosis    Coronary artery disease    Diabetes mellitus without complication (HCC)    Heart murmur    Hyperlipidemia    Hypertension    Oxygen dependent    2L at night   S/P  AVR with 21mm St Jude Mechanical prosthesis 06/15/2022 06/14/2022    Past Surgical History:  Procedure Laterality Date   AORTIC VALVE REPLACEMENT N/A 06/14/2022   Procedure: AORTIC VALVE REPLACEMENT (AVR) USING SJM REGENT MECHANICAL HEART VALVE SIZE ;  Surgeon: Eugenio Hoes, MD;  Location: [facility] OR;  Service: Open Heart Surgery;  Laterality: N/A;   ARTERY BIOPSY Right 07/20/2021   Procedure: BIOPSY OF RIGHT TEMPORAL ARTERY;  Surgeon: Chuck Hint, MD;  Location: [facility] OR;  Service: Vascular;  Laterality: Right;   cataract Right    HYSTEROSCOPY WITH D & C     removal of polyp   MULTIPLE TOOTH EXTRACTIONS     no teeth, no dentures   RIGHT AND LEFT HEART CATH N/A 04/19/2022   Procedure: RIGHT AND LEFT HEART CATH;  Surgeon: [name], MD;  Location: [facility] INVASIVE CV LAB;  Service: Cardiovascular;  Laterality: N/A;   TEE WITHOUT CARDIOVERSION N/A 06/14/2022   Procedure: TRANSESOPHAGEAL ECHOCARDIOGRAM;  Surgeon: Eugenio Hoes, MD;  Location: [facility] OR;  Service: Open Heart Surgery;  Laterality: N/A;    Family History  Problem Relation Age of Onset   Diabetes Sister     Social History   Socioeconomic History   Marital status: Widowed    Spouse name: Not on file   Number of children: 4   Years of education: Not on file   Highest education level: Not on file  Occupational History   Not on file  Tobacco Use   Smoking status: Never   Smokeless tobacco: Never  Vaping Use   Vaping Use: Never used  Substance and Sexual Activity   Alcohol use: Never   Drug use: Never   Sexual activity: Not Currently    Birth control/protection: Post-menopausal  Other Topics Concern   Not on file  Social History Narrative   Not on file   Social Determinants of Health   Financial Resource Strain: Not on file  Food Insecurity: No Food Insecurity (06/16/2022)   Hunger Vital Sign    Worried About Running Out of Food in the Last Year: Never true    Ran Out of Food in the Last Year: Never  true  Transportation Needs: No Transportation Needs (06/16/2022)   PRAPARE - Administrator, Civil Service (Medical): No    Lack of Transportation (Non-Medical): No  Physical Activity: Not on file  Stress: Not on file  Social Connections: Not on file  Intimate Partner Violence: Not At Risk (06/16/2022)   Humiliation, Afraid, Rape, and Kick questionnaire    Fear of Current or Ex-Partner: No    Emotionally Abused: No    Physically Abused: No    Sexually Abused: No    Review of Systems  Constitutional:  Positive for fatigue.  Eyes:  Positive for visual disturbance.  Respiratory:  Positive for apnea and shortness of breath.   Psychiatric/Behavioral:  Positive for sleep disturbance.     Vitals:   08/26/22  1627  BP: 132/82  Pulse: 95  SpO2: 98%     Physical Exam Constitutional:      Appearance: She is obese.  HENT:     Head: Normocephalic.     Nose: Nose normal.     Mouth/Throat:     Mouth: Mucous membranes are moist.     Comments: Mallampati 4, crowded oropharynx, macroglossia Eyes:     Pupils: Pupils are equal, round, and reactive to light.  Cardiovascular:     Rate and Rhythm: Normal rate and regular rhythm.     Heart sounds: Murmur heard.     No friction rub.  Pulmonary:     Effort: No respiratory distress.     Breath sounds: No stridor. No wheezing or rhonchi.  Musculoskeletal:     Cervical back: No rigidity or tenderness.  Neurological:     Mental Status: She is alert.  Psychiatric:        Mood and Affect: Mood normal.       05/11/2022   10:00 AM  Results of the Epworth flowsheet  Sitting and reading 0  Watching TV 0  Sitting, inactive in a public place (e.g. a theatre or a meeting) 2  As a passenger in a car for an hour without a break 2  Lying down to rest in the afternoon when circumstances permit 0  Sitting and talking to someone 0  Sitting quietly after a lunch without alcohol 0  In a car, while stopped for a few minutes in traffic 0   Total score 4    Data Reviewed: Recent echocardiogram with severe aortic stenosis -Aortic valve area of 0.71 cm  Recent cardiac catheterization with no significant coronary artery disease, pulmonary artery pressure of 37/16, cardiac index of 3.5  Recent CT scan of the chest with right upper lobe nodule, reviewed by myself  Recent sleep study reviewed showing moderate obstructive sleep apnea  Assessment:  Obstructive sleep apnea  Sleep onset and sleep maintenance insomnia  Excessive daytime sleepiness and fatigue  Severe aortic stenosis  Nocturnal desaturations for which she is on oxygen supplementation  Ongoing comorbidities include obesity, probable nocturnal hypoventilation syndrome, diastolic heart failure, diabetes, chronic kidney disease Most severe comorbidity will be patient's R symptomatic aortic valve stenosis-this is being addressed  She is s/p aortic valve replacement in April 2024  Plan/Recommendations: Will schedule patient for a titration sleep study  Prescription for Lunesta for sleep onset and sleep maintenance insomnia  Importance of weight management discussed  CPAP therapy has an option of treatment was discussed  Encouraged to continue oxygen supplementation at present  I will see her back in about 6 weeks  Virl Diamond MD [facility] 08/26/2022, 4:56 PM  CC: [name], MD

## 2022-08-26 NOTE — Patient Instructions (Signed)
Schedule for a CPAP titration study  Prescription for Lunesta will be sent to pharmacy  I will see you back in about 6 weeks  Call with significant concerns

## 2022-08-31 ENCOUNTER — Telehealth (HOSPITAL_COMMUNITY): Payer: Self-pay

## 2022-08-31 ENCOUNTER — Ambulatory Visit: Payer: Medicaid Other | Admitting: Cardiology

## 2022-08-31 DIAGNOSIS — Z952 Presence of prosthetic heart valve: Secondary | ICD-10-CM

## 2022-08-31 LAB — POCT INR: INR: 1.7 — AB (ref 2.0–3.0)

## 2022-08-31 MED ORDER — ENOXAPARIN SODIUM 100 MG/ML IJ SOSY: 90.0000 mg | PREFILLED_SYRINGE | Freq: Two times a day (BID) | INTRAMUSCULAR | 1 refills | Status: DC

## 2022-08-31 NOTE — Progress Notes (Signed)
Description   08/31/2022    INR today was 1.7. Patient goal level is (2.0-3.0)  Prior dose was 2.5 mg Monday and 5 mg all the other days.  Patient will continue 2.5 mg Monday, and 5 mg all the other days. But will STOP lovenox 90mg  bid.  Pt will recheck INR   Lab Results      Component                Value               Date                      INR                      2.1                 08/17/2022                INR                      1.3 (A)             08/10/2022                INR                      1.3 (A)             08/10/2022             S/P AVR with 21mm St Jude Mechanical prosthesis 06/15/2022  (primary encounter diagnosis) Plan: POCT INR

## 2022-08-31 NOTE — Telephone Encounter (Signed)
No response from pt after another call in regards to cardiac rehab. Closed referral.

## 2022-09-07 ENCOUNTER — Ambulatory Visit: Payer: Medicaid Other | Admitting: Cardiology

## 2022-09-07 DIAGNOSIS — Z952 Presence of prosthetic heart valve: Secondary | ICD-10-CM

## 2022-09-07 LAB — POCT INR: INR: 2.5 (ref 2.0–3.0)

## 2022-09-07 NOTE — Progress Notes (Signed)
Description   09/07/2022    INR today was 2.5. Patient goal level is (2.0-3.0)  Prior dose was 5 mg Monday and 5 mg all the other days.  Patient will continue 5 mg Monday, and 5 mg all the other days.  But will STOP lovenox 90mg  bid.  Pt will recheck INR in 2 weeks on 09/21/2022  Lab Results      Component                Value               Date                      INR                      2.5                 09/07/2022                INR                      1.7 (A)             08/31/2022                INR                      2.1                 08/17/2022            S/P AVR with 21mm St Jude Mechanical prosthesis 06/15/2022  (primary encounter diagnosis) Plan: POCT INR

## 2022-09-21 ENCOUNTER — Ambulatory Visit: Payer: Medicaid Other | Admitting: Cardiology

## 2022-09-21 DIAGNOSIS — Z952 Presence of prosthetic heart valve: Secondary | ICD-10-CM

## 2022-09-21 LAB — POCT INR: INR: 1.5 — AB (ref 2.0–3.0)

## 2022-09-21 MED ORDER — ENOXAPARIN SODIUM 100 MG/ML IJ SOSY: 91.0000 mg | PREFILLED_SYRINGE | Freq: Two times a day (BID) | INTRAMUSCULAR | 0 refills | Status: DC

## 2022-09-27 NOTE — Progress Notes (Signed)
Anti-Coagulation Progress Note  Chelsea English is a 62 y.o. 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 Patient goal level is (2.0-3.0)  Prior dose was 5 mg daily.  Patient will now start on 7.5 mg Wednesday, and 5 mg all the other days. With Lovenox 91 mg BID for 7 days.  Pt will recheck INR 1 weeks on 08/07.   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

## 2022-09-28 ENCOUNTER — Ambulatory Visit: Payer: Self-pay

## 2022-09-28 ENCOUNTER — Ambulatory Visit: Payer: Medicaid Other | Admitting: Cardiology

## 2022-09-28 DIAGNOSIS — Z952 Presence of prosthetic heart valve: Secondary | ICD-10-CM

## 2022-09-28 LAB — POCT INR: INR: 1.8 — AB (ref 2.0–3.0)

## 2022-09-29 ENCOUNTER — Other Ambulatory Visit: Payer: Self-pay

## 2022-09-29 MED ORDER — ENOXAPARIN SODIUM 100 MG/ML IJ SOSY: 91.0000 mg | PREFILLED_SYRINGE | Freq: Two times a day (BID) | INTRAMUSCULAR | 0 refills | Status: DC

## 2022-09-29 NOTE — Progress Notes (Signed)
Description   09/28/2022   INR today was 1.8. Patient goal level is (2.0-3.0)  Prior dose was 7.5 mg Wednesday, and 5 mg all the other days. With Lovenox 91 mg BID for 7 days.  Patient will continue 7.5 mg Wednesday, and 5 mg all the other days. With Lovenox 91 mg BID until next appointment.  Pt will recheck INR 5 days on 10/03/22.  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

## 2022-10-03 ENCOUNTER — Ambulatory Visit: Payer: Medicaid Other

## 2022-10-03 ENCOUNTER — Other Ambulatory Visit: Payer: Self-pay

## 2022-10-03 DIAGNOSIS — Z952 Presence of prosthetic heart valve: Secondary | ICD-10-CM

## 2022-10-03 LAB — POCT INR: INR: 1.9 — AB (ref 2.0–3.0)

## 2022-10-03 MED ORDER — WARFARIN SODIUM 6 MG PO TABS: 6.0000 mg | ORAL_TABLET | Freq: Every day | ORAL | 0 refills | Status: DC

## 2022-10-04 ENCOUNTER — Ambulatory Visit (HOSPITAL_BASED_OUTPATIENT_CLINIC_OR_DEPARTMENT_OTHER): Payer: Medicaid Other | Attending: Pulmonary Disease | Admitting: Pulmonary Disease

## 2022-10-04 DIAGNOSIS — G4733 Obstructive sleep apnea (adult) (pediatric): Secondary | ICD-10-CM | POA: Diagnosis present

## 2022-10-04 NOTE — Progress Notes (Signed)
Description   10/03/2022   INR today was 1.9. Patient goal level is (2.0-3.0)  Prior dose was 7.5 mg Wednesday, and 5 mg all the other days. With Lovenox 91 mg BID until 10/03/2022.  Patient will start 6mg  every day. Patient will STOP Lovenox.  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

## 2022-10-17 ENCOUNTER — Telehealth: Payer: Self-pay | Admitting: Pulmonary Disease

## 2022-10-17 DIAGNOSIS — G4733 Obstructive sleep apnea (adult) (pediatric): Secondary | ICD-10-CM | POA: Diagnosis not present

## 2022-10-17 NOTE — Telephone Encounter (Signed)
Call patient  Sleep study result  Date of study: 10/04/2022  Impression: Moderate obstructive sleep apnea  Recommendation: DME referral  Recommend CPAP therapy for moderate obstructive sleep apnea  Trial of CPAP therapy on 15 cm H2O, C-Flex of 3 with a Medium size Fisher[employer]Paykel Full Face Simplus mask and heated humidification.  Encourage weight loss measures  Follow-up in the office 4 to 6 weeks following initiation of treatment

## 2022-10-17 NOTE — Procedures (Signed)
POLYSOMNOGRAPHY  Last, First: Chelsea, English MRN: [id number] Gender: Female Age (years): 62 Weight (lbs): 201 DOB: Jun 01, 1960 BMI: 36 Primary Care: No PCP Epworth Score: 3 Referring: Tomma Lightning MD Technician: Armen Pickup Interpreting: Tomma Lightning MD Study Type: CPAP Ordered Study Type: CPAP Study date: 10/04/2022 Location:  CLINICAL INFORMATION Chelsea English is a 62 year old Female and was referred to the sleep center for evaluation of 780.57 - Unspecified Sleep Apnea, G47.30 Sleep Apnea, Unspecified (780.57). Indications include Congestive Heart Failure, Diabetes, Excessive Daytime Sleepiness, Hypertension, Morbid Obesity, Obesity, OSA, Snoring.   Most recent polysomnogram dated 08/03/2022 revealed an AHI of 18/h and RDI of 24/h. MEDICATIONS Patient self administered medications include: N/A. Medications administered during study include No sleep medicine administered.  SLEEP STUDY TECHNIQUE The patient underwent an attended overnight polysomnography titration to assess the effects of CPAP therapy. The following variables were monitored: EEG(C4-A1, C3-A2, O1-A2, O2-A1), EOG, submental and leg EMG, ECG, oxyhemoglobin saturation by pulse oximetry, thoracic and abdominal respiratory effort belts, nasal/oral airflow by pressure sensor, body position sensor and snoring sensor. CPAP pressure was titrated to eliminate apneas, hypopneas and oxygen desaturation. Hypopneas were scored per AASM definition IB (4% desaturation)  TECHNICIAN COMMENTS Comments added by Technician: one restroom visted. Patient had difficulty initiating sleep. Patient was restless all through the night. Comments added by Scorer: N/A SLEEP ARCHITECTURE The study was initiated at 9:26:19 PM and terminated at 4:26:58 AM. Total recorded time was 420.6 minutes. EEG confirmed total sleep time was 247 minutes yielding a sleep efficiency of 58.7%. Sleep onset after lights out was 25.5 minutes with a  REM latency of 39.0 minutes. The patient spent 2.2% of the night in stage N1 sleep, 69.2% in stage N2 sleep, 5.1% in stage N3 and 23.5% in REM. The Arousal Index was 9.5/hour. RESPIRATORY PARAMETERS The overall AHI was 0.2 per hour, and the RDI was 0.2 events/hour with a central apnea index of 0per hour. The most appropriate setting of CPAP was 15 cm H2O. At this setting, the sleep efficiency was 30% and the patient was supine for 100%. The AHI was 0 events per hour, and the RDI was 0 events/hour (with 0 central events) and the arousal index was 11 per hour.The oxygen nadir was 96.0% during sleep.  LEG MOVEMENT DATA The total leg movements were 0 with a resulting leg movement index of 0.0/hr. Associated arousal with leg movement index was 0.0/hr. CARDIAC DATA The underlying cardiac rhythm was most consistent with sinus rhythm. Mean heart rate during sleep was 69.5 bpm. Additional rhythm abnormalities include None.  IMPRESSIONS - EKG showed no cardiac abnormalities. - No snoring was audible during this study. - Mild Oxygen Desaturation - No Significant Obstructive Sleep apnea(OSA) Optimal pressure attained. - No Significant Upper Airway Resistance Syndrome(UARS). - No significant periodic leg movements(PLMs) during sleep. However, no significant associated arousals. [name] reduced sleep efficiency, long primary sleep latency, short REM sleep latency and long slow wave latency.  DIAGNOSIS - Obstructive Sleep Apnea (G47.33)  RECOMMENDATIONS - Trial of CPAP therapy on 15 cm H2O, C-Flex of 3 with a Medium size Fisher[employer]Paykel Full Face Simplus mask and heated humidification. - Avoid alcohol, sedatives and other CNS depressants that may worsen sleep apnea and disrupt normal sleep architecture. - Sleep hygiene should be reviewed to assess factors that may improve sleep quality. - Weight management and regular exercise should be initiated or continued. - Return to Sleep Center for re-evaluation after  4 weeks of therapy  [Electronically signed]  10/17/2022 06:50 AM  Virl Diamond MD NPI: [id number]

## 2022-10-21 NOTE — Telephone Encounter (Signed)
ATC x1 phone rang and went busy.Tried to contact patient regarding sleep study will post on mychart.

## 2022-10-26 ENCOUNTER — Ambulatory Visit: Payer: Medicaid Other | Admitting: Cardiology

## 2022-10-26 DIAGNOSIS — Z952 Presence of prosthetic heart valve: Secondary | ICD-10-CM

## 2022-10-26 LAB — POCT INR: INR: 2.2 (ref 2.0–3.0)

## 2022-10-27 NOTE — Progress Notes (Signed)
Description   10/26/2022   INR today was 2.2. Patient goal level is (2.0-3.0)  Prior dose was 6mg  every day. Patient  STOPPED Lovenox.  Patient will continue same dose of 6mg  all days. 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

## 2022-11-02 ENCOUNTER — Ambulatory Visit: Payer: Medicaid Other | Admitting: Cardiology

## 2022-11-02 ENCOUNTER — Encounter: Payer: Self-pay | Admitting: Cardiology

## 2022-11-02 VITALS — BP 144/68 | HR 90 | Resp 16 | Ht 63.0 in | Wt 204.0 lb

## 2022-11-02 DIAGNOSIS — E7841 Elevated Lipoprotein(a): Secondary | ICD-10-CM

## 2022-11-02 DIAGNOSIS — Z2989 Encounter for other specified prophylactic measures: Secondary | ICD-10-CM

## 2022-11-02 DIAGNOSIS — E782 Mixed hyperlipidemia: Secondary | ICD-10-CM

## 2022-11-02 DIAGNOSIS — I10 Essential (primary) hypertension: Secondary | ICD-10-CM

## 2022-11-02 MED ORDER — AZITHROMYCIN 500 MG PO TABS
500.0000 mg | ORAL_TABLET | ORAL | 5 refills | Status: AC
Start: 2022-11-02 — End: ?

## 2022-11-02 MED ORDER — AZITHROMYCIN 500 MG PO TABS: 500.0000 mg | ORAL_TABLET | Freq: Every day | ORAL | 5 refills | Status: DC

## 2022-11-02 NOTE — Progress Notes (Signed)
Follow up visit  Subjective:   Chelsea English, female    DOB: [birth year], 62 y.o.   MRN: [id number]   HPI  Chief Complaint  Patient presents with   Severe aortic stenosis   Follow-up    104 month   62 year old [place] female with hypertension, hyperlipidemia, type 2 diabetes mellitus, diabetic retinopathy w. left eye blindness, chronic kidney disease, severe aortic stenosis now s/p SAVR (21mm St Jude Mechanical prosthesis), elevated lipoprotein (a).  Patient's daughter Luci Bank, who is also an Charity fundraiser, assisted with language depression.  Patient is doing well from cardiac standpoint, denies any complaints. Her ongoing issues are related to back pain.    Current Outpatient Medications:    amLODipine (NORVASC) 5 MG tablet, Take 5 mg by mouth daily., Disp: , Rfl:    aspirin 81 MG chewable tablet, Chew 81 mg by mouth daily., Disp: , Rfl:    atorvastatin (LIPITOR) 40 MG tablet, Take 40 mg by mouth at bedtime., Disp: , Rfl:    BD INSULIN SYRINGE U/F 31G X 5/16" 1 ML MISC, Inject 1 each into the skin 3 (three) times daily. Use to inject unsulin, Disp: , Rfl:    enoxaparin (LOVENOX) 100 MG/ML injection, Inject 0.9 mLs (90 mg total) into the skin every 12 (twelve) hours., Disp: 6 mL, Rfl: 0   eszopiclone (LUNESTA) 2 MG TABS tablet, Take 1 tablet (2 mg total) by mouth at bedtime as needed for sleep. Take immediately before bedtime, Disp: 30 tablet, Rfl: 2   ferrous sulfate 325 (65 FE) MG tablet, Take 650 mg by mouth daily with breakfast., Disp: , Rfl:    Finerenone (KERENDIA) 10 MG TABS, Take 1 tablet (10 mg total) by mouth daily., Disp: 30 tablet, Rfl: 3   furosemide (LASIX) 40 MG tablet, Take 1 tablet (40 mg total) by mouth daily., Disp: 14 tablet, Rfl: 0   gabapentin (NEURONTIN) 100 MG capsule, Take 100 mg by mouth daily., Disp: , Rfl:    insulin NPH-regular Human (HUMULIN 70/30) (70-30) 100 UNIT/ML injection, Inject 40-60 Units into the skin See admin instructions. Injecting 40 units in the  morning and 20 units at bedtime.adjust to previous dose if sugars elevated (Patient taking differently: Inject 40-60 Units into the skin See admin instructions. Injecting 60 units in the morning and 40 units at bedtime.adjust to previous dose if sugars elevated), Disp: 10 mL, Rfl: 11   JANUVIA 100 MG tablet, Take 100 mg by mouth daily., Disp: , Rfl:    Multiple Vitamins-Minerals (MULTI FOR HER 50+ PO), Take 1 tablet by mouth daily., Disp: , Rfl:    polyethylene glycol (MIRALAX / GLYCOLAX) 17 g packet, Take 17 g by mouth daily as needed for moderate constipation., Disp: , Rfl:    warfarin (COUMADIN) 5 MG tablet, Take 5 mg by mouth as directed. 2.5 mg M,Th,Sat, 5 mg all other days, Disp: , Rfl:    warfarin (COUMADIN) 6 MG tablet, Take 1 tablet (6 mg total) by mouth daily., Disp: 90 tablet, Rfl: 0   Cardiovascular & other pertient studies:  Reviewed external labs and tests, independently interpreted  EKG 07/04/2022: Sinus rhythm 98 bpm First degree A-V block Occasional PAC   Low voltage in precordial leads Left axis deviation Nonspecific T-abnormality   Recent labs: 07/05/2022: Glucose 56, BUN/Cr 40/1.57. EGFR 37. Na/K 140/4.4.  Chol 123, TG 87, HDL 37, LDL 69  06/21/2022: Glucose 248, BUN/Cr 60/1.73. EGFR 33. Na/K 132/4.9.  H/H 7.8/23.7. MCV 84. Platelets 149. WBC 16k HbA1C 9.9% Lipoprotein (  a) 441  06/2021: Chol 152, TG 156, HDL 27, LDL 94   Review of Systems  Constitutional: Positive for malaise/fatigue.  Cardiovascular:  Positive for dyspnea on exertion. Negative for chest pain, leg swelling, palpitations and syncope.        Vitals:   11/02/22 1412  BP: (!) 144/68  Pulse: 90  Resp: 16  SpO2: 97%     Body mass index is 36.14 kg/m. Filed Weights   11/02/22 1412  Weight: 204 lb (92.5 kg)      Objective:   Physical Exam Vitals and nursing note reviewed.  Constitutional:      General: She is not in acute distress. Neck:     Vascular: No JVD.  Cardiovascular:      Rate and Rhythm: Normal rate and regular rhythm.     Heart sounds: No murmur heard.    Comments: Metallic S2 Pulmonary:     Effort: Pulmonary effort is normal.     Breath sounds: Normal breath sounds. No wheezing or rales.  Musculoskeletal:     Right lower leg: No edema.     Left lower leg: No edema.             Visit diagnoses:   ICD-10-CM   1. Indication present for endocarditis prophylaxis  Z29.89 azithromycin (ZITHROMAX) 500 MG tablet    DISCONTINUED: azithromycin (ZITHROMAX) 500 MG tablet    2. Essential hypertension  I10     3. Mixed hyperlipidemia  E78.2     4. Elevated lipoprotein A level  E78.41            Meds ordered this encounter  Medications   DISCONTD: azithromycin (ZITHROMAX) 500 MG tablet    Sig: Take 1 tablet (500 mg total) by mouth daily.    Dispense:  5 tablet    Refill:  5   azithromycin (ZITHROMAX) 500 MG tablet    Sig: Take 1 tablet (500 mg total) by mouth as directed. Take 1 pill 30-60 min before dental procedure    Dispense:  5 tablet    Refill:  5     Assessment & Recommendations:   62 y.o. [place] female with hypertension, hyperlipidemia, type 2 diabetes mellitus, diabetic retinopathy w. 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 Now s/p mechanical AVR with 21 mm Saint Jude valve. Valve functioning well on follow-up echocardiogram on 06/16/2022. Goal: 2.5 Indication: Mechnical AVR Recommend endocarditis prophylaxis. Prescribed azithromycin (penicillin allergy).  Hypertension: Fairly well controlled.  No change made today.  CKD 3B: Stable  Elevated lipoprotein (a): 441, possibly contributing to severe aortic stenosis.  Discussed participation in clinical trial, she wants to hold off for now.  F/u in 1 year    [name], MD Pager: [phone] Office:  [phone]

## 2022-11-08 ENCOUNTER — Encounter: Payer: Self-pay | Admitting: Pharmacist

## 2022-11-10 ENCOUNTER — Ambulatory Visit: Payer: Medicaid Other | Admitting: Cardiology

## 2022-11-22 ENCOUNTER — Other Ambulatory Visit: Payer: Self-pay | Admitting: Cardiology

## 2022-11-22 DIAGNOSIS — Z79899 Other long term (current) drug therapy: Secondary | ICD-10-CM

## 2022-11-23 NOTE — Telephone Encounter (Signed)
Please check BMP and send 1 month supply with 1 refill. If possible, I will let PCP take over future refills, as potassium will need to be monitored closely.  Thanks MJP

## 2022-11-23 NOTE — Telephone Encounter (Signed)
I don't seem to find a potassium level from 10/31/2022.  Thanks MJP

## 2022-11-25 ENCOUNTER — Ambulatory Visit: Payer: Medicaid Other | Attending: Internal Medicine

## 2022-11-28 ENCOUNTER — Ambulatory Visit: Payer: Medicaid Other

## 2022-11-28 ENCOUNTER — Other Ambulatory Visit: Payer: Self-pay

## 2022-11-28 DIAGNOSIS — Z952 Presence of prosthetic heart valve: Secondary | ICD-10-CM

## 2022-11-30 ENCOUNTER — Ambulatory Visit: Payer: Medicaid Other | Attending: Cardiology

## 2022-11-30 DIAGNOSIS — Z952 Presence of prosthetic heart valve: Secondary | ICD-10-CM | POA: Diagnosis not present

## 2022-11-30 DIAGNOSIS — Z79899 Other long term (current) drug therapy: Secondary | ICD-10-CM

## 2022-12-01 LAB — BASIC METABOLIC PANEL
BUN/Creatinine Ratio: 34 — ABNORMAL HIGH (ref 12–28)
BUN: 61 mg/dL — ABNORMAL HIGH (ref 8–27)
CO2: 20 mmol/L (ref 20–29)
Calcium: 8.8 mg/dL (ref 8.7–10.3)
Chloride: 97 mmol/L (ref 96–106)
Creatinine, Ser: 1.78 mg/dL — ABNORMAL HIGH (ref 0.57–1.00)
Glucose: 351 mg/dL — ABNORMAL HIGH (ref 70–99)
Potassium: 4.8 mmol/L (ref 3.5–5.2)
Sodium: 135 mmol/L (ref 134–144)
eGFR: 32 mL/min/{1.73_m2} — ABNORMAL LOW (ref >59)

## 2022-12-02 ENCOUNTER — Ambulatory Visit: Payer: Medicaid Other | Attending: Cardiology

## 2022-12-02 ENCOUNTER — Telehealth: Payer: Self-pay | Admitting: *Deleted

## 2022-12-02 DIAGNOSIS — Z5181 Encounter for therapeutic drug level monitoring: Secondary | ICD-10-CM | POA: Diagnosis not present

## 2022-12-02 LAB — POCT INR: INR: 2.2 (ref 2.0–3.0)

## 2022-12-02 NOTE — Patient Instructions (Signed)
Continue taking 6 mg Daily.  INR in 2 weeks.  A full discussion of the nature of anticoagulants has been carried out.  A benefit risk analysis has been presented to the patient, so that they understand the justification for choosing anticoagulation at this time. The need for frequent and regular monitoring, precise dosage adjustment and compliance is stressed.  Side effects of potential bleeding are discussed.  The patient should avoid any OTC items containing aspirin or ibuprofen, and should avoid great swings in general diet.  Avoid alcohol consumption.  Call if any signs of abnormal bleeding.  [phone]

## 2022-12-02 NOTE — Telephone Encounter (Signed)
Called pt/dtr since the pt was due for Anticoagulation Appt at 230pm. There was no answer and no voicemail set up.

## 2022-12-16 ENCOUNTER — Telehealth: Payer: Self-pay

## 2022-12-16 ENCOUNTER — Ambulatory Visit: Payer: Medicaid Other

## 2022-12-16 NOTE — Telephone Encounter (Signed)
Lp's daughter message tcb to reschedule INR check.

## 2022-12-21 ENCOUNTER — Ambulatory Visit: Payer: Medicaid Other | Attending: Cardiology

## 2022-12-21 DIAGNOSIS — Z952 Presence of prosthetic heart valve: Secondary | ICD-10-CM

## 2022-12-21 DIAGNOSIS — Z7901 Long term (current) use of anticoagulants: Secondary | ICD-10-CM | POA: Diagnosis not present

## 2022-12-21 LAB — POCT INR: INR: 1.9 — AB (ref 2.0–3.0)

## 2022-12-21 NOTE — Patient Instructions (Signed)
Description   START taking 6 mg (1 tablet) daily EXCEPT 9mg  (1.5 tablets) on Wednesdays.   Stay consistent with greens each week (2 per week) Recheck INR in 2 weeks. [facility] [phone]

## 2023-01-04 ENCOUNTER — Ambulatory Visit: Payer: Medicaid Other | Attending: Cardiology

## 2023-01-04 DIAGNOSIS — Z952 Presence of prosthetic heart valve: Secondary | ICD-10-CM

## 2023-01-04 DIAGNOSIS — Z5181 Encounter for therapeutic drug level monitoring: Secondary | ICD-10-CM | POA: Diagnosis not present

## 2023-01-04 LAB — POCT INR: INR: 2.8 (ref 2.0–3.0)

## 2023-01-04 NOTE — Patient Instructions (Signed)
Description   Continue taking 6 mg (1 tablet) daily EXCEPT 9mg  (1.5 tablets) on Wednesdays.   Stay consistent with greens each week (2 per week) Recheck INR in 3 weeks. [facility] [phone]

## 2023-01-25 ENCOUNTER — Ambulatory Visit: Payer: Medicaid Other | Attending: Cardiovascular Disease

## 2023-01-25 DIAGNOSIS — Z952 Presence of prosthetic heart valve: Secondary | ICD-10-CM | POA: Diagnosis not present

## 2023-01-25 DIAGNOSIS — Z5181 Encounter for therapeutic drug level monitoring: Secondary | ICD-10-CM | POA: Diagnosis not present

## 2023-01-25 LAB — POCT INR: INR: 2.7 (ref 2.0–3.0)

## 2023-01-25 NOTE — Patient Instructions (Signed)
Description   Continue taking 6 mg (1 tablet) daily EXCEPT 9mg  (1.5 tablets) on Wednesdays.   Stay consistent with greens each week (2 per week) Recheck INR in 4 weeks. [facility] [phone]

## 2023-02-24 ENCOUNTER — Ambulatory Visit: Payer: Medicaid Other | Attending: Cardiology | Admitting: *Deleted

## 2023-02-24 DIAGNOSIS — Z7901 Long term (current) use of anticoagulants: Secondary | ICD-10-CM | POA: Diagnosis not present

## 2023-02-24 DIAGNOSIS — Z952 Presence of prosthetic heart valve: Secondary | ICD-10-CM

## 2023-02-24 LAB — POCT INR: INR: 3.9 — AB (ref 2.0–3.0)

## 2023-02-24 MED ORDER — WARFARIN SODIUM 6 MG PO TABS: ORAL_TABLET | ORAL | 1 refills | Status: DC

## 2023-02-24 NOTE — Patient Instructions (Signed)
 Description   Do not take any warfarin tomorrow (already taken today's dose) then continue taking 6 mg (1 tablet) daily EXCEPT 9mg  (1.5 tablets) on Wednesdays.   Stay consistent with greens each week (2 per week) Recheck INR in 3 weeks. [facility] [phone]

## 2023-03-17 ENCOUNTER — Ambulatory Visit: Payer: Medicaid Other | Attending: Internal Medicine

## 2023-03-17 DIAGNOSIS — Z7901 Long term (current) use of anticoagulants: Secondary | ICD-10-CM | POA: Diagnosis not present

## 2023-03-17 DIAGNOSIS — Z952 Presence of prosthetic heart valve: Secondary | ICD-10-CM

## 2023-03-17 LAB — POCT INR: INR: 5.6 — AB (ref 2.0–3.0)

## 2023-03-17 NOTE — Patient Instructions (Signed)
Do not take any warfarin tomorrow, Sunday and Monday then Decrease to 6 mg (1 tablet) daily.  Eat greens today and tomorrow. Stay consistent with greens each week (2 per week) Recheck INR in 1 week. [facility] [phone]

## 2023-03-24 ENCOUNTER — Ambulatory Visit: Payer: Medicaid Other | Attending: Cardiology | Admitting: *Deleted

## 2023-03-24 DIAGNOSIS — Z952 Presence of prosthetic heart valve: Secondary | ICD-10-CM

## 2023-03-24 DIAGNOSIS — Z7901 Long term (current) use of anticoagulants: Secondary | ICD-10-CM

## 2023-03-24 LAB — POCT INR: INR: 1.6 — AB (ref 2.0–3.0)

## 2023-03-24 NOTE — Patient Instructions (Signed)
Description   Today take another 3mg  of warfarin then resume taking 6mg  (1 tablet) daily.  Stay consistent with greens each week (2 per week). Recheck INR in 1 week. [facility] [phone]

## 2023-03-29 ENCOUNTER — Ambulatory Visit: Payer: Medicaid Other | Attending: Cardiology | Admitting: *Deleted

## 2023-03-29 DIAGNOSIS — Z952 Presence of prosthetic heart valve: Secondary | ICD-10-CM

## 2023-03-29 DIAGNOSIS — Z7901 Long term (current) use of anticoagulants: Secondary | ICD-10-CM | POA: Diagnosis not present

## 2023-03-29 LAB — POCT INR: INR: 2.5 (ref 2.0–3.0)

## 2023-03-29 NOTE — Patient Instructions (Signed)
 Description   Continue taking warfarin 6mg  (1 tablet) daily.  Stay consistent with greens each week (2 per week). Recheck INR in 2 weeks.  [facility] [phone]

## 2023-04-14 ENCOUNTER — Ambulatory Visit: Payer: Medicaid Other | Attending: Cardiology

## 2023-04-14 DIAGNOSIS — Z7901 Long term (current) use of anticoagulants: Secondary | ICD-10-CM

## 2023-04-14 DIAGNOSIS — Z952 Presence of prosthetic heart valve: Secondary | ICD-10-CM | POA: Diagnosis not present

## 2023-04-14 LAB — POCT INR: INR: 2.9 (ref 2.0–3.0)

## 2023-04-14 NOTE — Patient Instructions (Signed)
Continue taking warfarin 6mg  (1 tablet) daily.  Stay consistent with greens each week (2 per week). Recheck INR in 4 weeks.  [facility] [phone]

## 2023-05-12 ENCOUNTER — Ambulatory Visit: Payer: Medicaid Other

## 2023-05-17 ENCOUNTER — Ambulatory Visit: Attending: Cardiology

## 2023-05-17 DIAGNOSIS — Z952 Presence of prosthetic heart valve: Secondary | ICD-10-CM | POA: Diagnosis not present

## 2023-05-17 LAB — POCT INR: INR: 3.1 — AB (ref 2.0–3.0)

## 2023-05-17 NOTE — Patient Instructions (Signed)
 Description   Eat a serving of greens today and continue taking warfarin 6mg  (1 tablet) daily.   Stay consistent with greens each week (2 per week). Recheck INR in 4 weeks.  [facility] [phone]

## 2023-06-14 ENCOUNTER — Ambulatory Visit

## 2023-06-20 ENCOUNTER — Other Ambulatory Visit: Payer: Self-pay | Admitting: Cardiology

## 2023-06-20 DIAGNOSIS — Z7901 Long term (current) use of anticoagulants: Secondary | ICD-10-CM

## 2023-06-20 DIAGNOSIS — Z952 Presence of prosthetic heart valve: Secondary | ICD-10-CM

## 2023-06-21 NOTE — Telephone Encounter (Signed)
 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

## 2023-06-29 ENCOUNTER — Ambulatory Visit: Attending: Cardiology | Admitting: *Deleted

## 2023-06-29 DIAGNOSIS — Z7901 Long term (current) use of anticoagulants: Secondary | ICD-10-CM | POA: Insufficient documentation

## 2023-06-29 DIAGNOSIS — Z952 Presence of prosthetic heart valve: Secondary | ICD-10-CM | POA: Insufficient documentation

## 2023-06-29 LAB — POCT INR: INR: 2.7 (ref 2.0–3.0)

## 2023-06-29 NOTE — Patient Instructions (Signed)
 Description   Continue taking warfarin 6mg  (1 tablet) daily.   Stay consistent with greens each week (2 per week). Recheck INR in 4 weeks.  [facility] [phone]

## 2023-07-27 ENCOUNTER — Ambulatory Visit

## 2023-08-02 ENCOUNTER — Ambulatory Visit: Attending: Cardiology | Admitting: *Deleted

## 2023-08-02 DIAGNOSIS — Z952 Presence of prosthetic heart valve: Secondary | ICD-10-CM | POA: Diagnosis present

## 2023-08-02 DIAGNOSIS — Z7901 Long term (current) use of anticoagulants: Secondary | ICD-10-CM | POA: Diagnosis present

## 2023-08-02 LAB — POCT INR: INR: 2.5 (ref 2.0–3.0)

## 2023-08-02 NOTE — Patient Instructions (Signed)
 Description   Continue taking warfarin 6mg  (1 tablet) daily.   Stay consistent with greens each week (2 per week). Recheck INR in 5 weeks.  [facility] [phone]

## 2023-09-06 ENCOUNTER — Ambulatory Visit: Attending: Cardiology

## 2023-09-06 DIAGNOSIS — Z952 Presence of prosthetic heart valve: Secondary | ICD-10-CM | POA: Diagnosis present

## 2023-09-06 LAB — POCT INR: INR: 2.3 (ref 2.0–3.0)

## 2023-09-06 NOTE — Progress Notes (Signed)
Please see anticoagulation encounter.

## 2023-09-06 NOTE — Patient Instructions (Signed)
 Description   Continue taking warfarin 6mg  (1 tablet) daily.   Stay consistent with greens each week (2 per week). Recheck INR in 6 weeks.  [facility] [phone]

## 2023-10-13 ENCOUNTER — Encounter: Payer: Self-pay | Admitting: Cardiology

## 2023-10-13 ENCOUNTER — Ambulatory Visit: Attending: Cardiology | Admitting: Cardiology

## 2023-10-13 VITALS — BP 142/62 | HR 87 | Ht 63.0 in | Wt 206.6 lb

## 2023-10-13 DIAGNOSIS — E782 Mixed hyperlipidemia: Secondary | ICD-10-CM | POA: Insufficient documentation

## 2023-10-13 DIAGNOSIS — Z794 Long term (current) use of insulin: Secondary | ICD-10-CM | POA: Insufficient documentation

## 2023-10-13 DIAGNOSIS — M7989 Other specified soft tissue disorders: Secondary | ICD-10-CM | POA: Insufficient documentation

## 2023-10-13 DIAGNOSIS — I5032 Chronic diastolic (congestive) heart failure: Secondary | ICD-10-CM | POA: Diagnosis present

## 2023-10-13 DIAGNOSIS — I350 Nonrheumatic aortic (valve) stenosis: Secondary | ICD-10-CM | POA: Insufficient documentation

## 2023-10-13 DIAGNOSIS — N1832 Chronic kidney disease, stage 3b: Secondary | ICD-10-CM | POA: Diagnosis present

## 2023-10-13 DIAGNOSIS — E7841 Elevated Lipoprotein(a): Secondary | ICD-10-CM | POA: Insufficient documentation

## 2023-10-13 DIAGNOSIS — I10 Essential (primary) hypertension: Secondary | ICD-10-CM | POA: Insufficient documentation

## 2023-10-13 DIAGNOSIS — E1142 Type 2 diabetes mellitus with diabetic polyneuropathy: Secondary | ICD-10-CM | POA: Insufficient documentation

## 2023-10-13 MED ORDER — KERENDIA 10 MG PO TABS: 1.0000 | ORAL_TABLET | Freq: Every day | ORAL | 3 refills | Status: DC

## 2023-10-13 NOTE — Patient Instructions (Signed)
 Medication Instructions:  START Kerendia  daily   *If you need a refill on your cardiac medications before your next appointment, please call your pharmacy*  Lab Work in 3 to 4 weeks : BMP PROBNP LIPID PANEL  LP(a)  If you have labs (blood work) drawn today and your tests are completely normal, you will receive your results only by: MyChart Message (if you have MyChart) OR A paper copy in the mail If you have any lab test that is abnormal or we need to change your treatment, we will call you to review the results.  Testing/Procedures: Echo  Your physician has requested that you have an echocardiogram. Echocardiography is a painless test that uses sound waves to create images of your heart. It provides your doctor with information about the size and shape of your heart and how well your heart's chambers and valves are working. This procedure takes approximately one hour. There are no restrictions for this procedure. Please do NOT wear cologne, perfume, aftershave, or lotions (deodorant is allowed). Please arrive 15 minutes prior to your appointment time.  Please note: We ask at that you not bring children with you during ultrasound (echo/ vascular) testing. Due to room size and safety concerns, children are not allowed in the ultrasound rooms during exams. Our front office staff cannot provide observation of children in our lobby area while testing is being conducted. An adult accompanying a patient to their appointment will only be allowed in the ultrasound room at the discretion of the ultrasound technician under special circumstances. We apologize for any inconvenience.   Follow-Up: At [facility], you and your health needs are our priority.  As part of our continuing mission to provide you with exceptional heart care, our providers are all part of one team.  This team includes your primary Cardiologist (physician) and Advanced Practice Providers or APPs (Physician Assistants  and Nurse Practitioners) who all work together to provide you with the care you need, when you need it.  Your next appointment:   3 month(s)  Provider:   Newman JINNY Lawrence, MD

## 2023-10-13 NOTE — Progress Notes (Signed)
 Cardiology Office Note:  .   Date:  10/13/2023  ID:  Chelsea English, DOB 03/15/1960, MRN [id number] PCP: Mark Agent, MD  [facility] Providers Cardiologist:  Newman Lawrence, MD PCP: Mark Agent, MD  Chief Complaint  Patient presents with   Hypertension   Aortic Stenosis   Leg Swelling     Chelsea English is a 63 y.o. female with hypertension, hyperlipidemia, type 2 diabetes mellitus, diabetic retinopathy w. left eye blindness, chronic kidney disease, severe aortic stenosis now s/p SAVR (21mm St Jude Mechanical prosthesis), elevated lipoprotein (a).   History of Present Illness  Patient's daughter Chelsea English Nurse assisted with language interpretation today.  Patient lives with a significant lifestyle.  With this level of activity, she denies any significant shortness of breath or chest pain symptoms.  She has had recent increase in her leg swelling.  Her PCP Dr. Mark increased her Lasix  from 40 mg daily to 40 mg twice daily, with which there has been improvement in her leg swelling.  Reviewed recent lab results performed at recent PCP visit, details below.  Patient currently does not see endocrinologist or nephrologist.  Patient's daughter asked me if she should be on Kerendia .   Vitals:   10/13/23 1028  Pulse: 87  SpO2: 96%      Review of Systems  Cardiovascular:  Positive for leg swelling. Negative for chest pain, dyspnea on exertion, palpitations and syncope.  Neurological:  Positive for numbness.        Studies Reviewed: SABRA       EKG 10/13/2023: Sinus rhythm with 1st degree A-V block Possible Left atrial enlargement Left axis deviation Left ventricular hypertrophy ( R in aVL , Cornell product ) Nonspecific T wave abnormality When compared with ECG of 17-Jun-2022 07:06,  QRS is narrower    Independently interpreted 08/2023: HbA1C 9,7% Hb 10.6 Cr 1.86, Na 134, Gl 207   Labs 2-11/2022: Chol 123, TG 87, HDL 37, LDL 69 Lp(a) 410 HbA1C  9.9% Hb 7.8 Cr 1.78    Physical Exam Vitals and nursing note reviewed.  Constitutional:      General: She is not in acute distress. Neck:     Vascular: No JVD.  Cardiovascular:     Rate and Rhythm: Normal rate and regular rhythm.     Heart sounds: Normal heart sounds. No murmur heard. Pulmonary:     Effort: Pulmonary effort is normal.     Breath sounds: Normal breath sounds. No wheezing or rales.  Musculoskeletal:     Right lower leg: Edema (Trace) present.     Left lower leg: Edema (Trace) present.      VISIT DIAGNOSES:   ICD-10-CM   1. Severe aortic stenosis  I35.0     2. Essential hypertension  I10 EKG 12-Lead    3. Elevated lipoprotein A level  E78.41 Lipoprotein A (LPA)    4. Mixed hyperlipidemia  E78.2 Lipid panel    5. Leg swelling  M79.89 ECHOCARDIOGRAM COMPLETE    Basic metabolic panel with GFR    Pro b natriuretic peptide (BNP)    6. Chronic heart failure with preserved ejection fraction (HCC)  I50.32     7. Type 2 diabetes mellitus with diabetic polyneuropathy, with long-term current use of insulin  (HCC)  E11.42    Z79.4     8. Stage 3b chronic kidney disease (HCC)  N18.32        Chelsea English is a 63 y.o. female with hypertension, hyperlipidemia, type 2 diabetes mellitus, diabetic retinopathy  w. left eye blindness, chronic kidney disease, severe aortic stenosis now s/p SAVR (21mm St Jude Mechanical prosthesis), elevated lipoprotein (a).  Assessment & Plan  Leg edema: Likely secondary to advanced CKD, or HFpEF. Currently, patient is on Lasix  40 mg twice daily, continue the same. Check echocardiogram and proBNP.  CKD 3B: Given her CKD, diabetes, I do agree that she should be on Kerendia  to reduce progression of CKD.  With her GFR of 30 and potassium 4.6, I would start her on Kerendia  10 mg daily. Check BNP in 3-4 weeks. Please consider referral to nephrology.  Type 2 diabetes mellitus: Remains poorly controlled with A1c >9% for long time. She  is currently on insulin  NPH and Ozempic. Please consider referral to endocrinology.  Severe aortic stenosis: Diagnosed in 03/2022. Now s/p mechanical AVR with 21 mm Saint Jude valve. Valve functioning well on follow-up echocardiogram on 06/16/2022. Goal: 2.5 Indication: Mechnical AVR Recommend endocarditis prophylaxis. Prescribed azithromycin  (penicillin allergy).   Hypertension: Fairly well controlled.  No change made today.   Mixed hyperlipidemia, elevated lipoprotein (a): 441, possibly contributing to severe aortic stenosis. Currently on Lipitor 40 mg daily, continue same. I will check lipid panel and lipoprotein a to see if she would benefit from injectable agents.      I will send my note to patient's PCP Dr. Ervin.  Have encouraged the patient and her daughter to contact Dr. Mark to consider nephrology and endocrinology referrals.    I spent 45 minutes in the care of [name] today including reviewing recent external labs, discussing workup including echocardiogram, lab test, discussing options to reduce progression of CKD, coordinating care, and documenting in the encounter.   Meds ordered this encounter  Medications   Finerenone  (KERENDIA ) 10 MG TABS    Sig: Take 1 tablet (10 mg total) by mouth daily.    Dispense:  30 tablet    Refill:  3     F/u in 3 months  Signed, Newman JINNY Lawrence, MD

## 2023-10-18 ENCOUNTER — Ambulatory Visit: Attending: Cardiology

## 2023-10-18 DIAGNOSIS — Z7901 Long term (current) use of anticoagulants: Secondary | ICD-10-CM | POA: Insufficient documentation

## 2023-10-18 DIAGNOSIS — Z952 Presence of prosthetic heart valve: Secondary | ICD-10-CM | POA: Insufficient documentation

## 2023-10-18 LAB — POCT INR: INR: 2.4 (ref 2.0–3.0)

## 2023-10-18 NOTE — Patient Instructions (Signed)
 Description   INR 2.4 Continue taking warfarin 6mg  (1 tablet) daily.   Stay consistent with greens each week (2 per week). Recheck INR in 6 weeks.  Coumadin  Clinic [phone]

## 2023-10-18 NOTE — Progress Notes (Signed)
 Description   INR 2.4 Continue taking warfarin 6mg  (1 tablet) daily.   Stay consistent with greens each week (2 per week). Recheck INR in 6 weeks.  Coumadin  Clinic [phone]

## 2023-11-20 ENCOUNTER — Telehealth (HOSPITAL_COMMUNITY): Payer: Self-pay | Admitting: Cardiology

## 2023-11-20 NOTE — Telephone Encounter (Signed)
 Patient cancelled echocardiogram for reason below:  11/20/2023 1:43 PM Ab:DFPUY, Chelsea English  Cancel Rsn: Patient (unable to make appt. will c/b to r/s)   Order will be removed from the active echo WQ. If patient calls back we will reinstate the order. Thank you.

## 2023-11-21 ENCOUNTER — Ambulatory Visit (HOSPITAL_COMMUNITY)

## 2023-11-30 ENCOUNTER — Ambulatory Visit: Attending: Cardiology

## 2023-12-11 ENCOUNTER — Other Ambulatory Visit: Payer: Self-pay | Admitting: Cardiology

## 2023-12-11 DIAGNOSIS — Z952 Presence of prosthetic heart valve: Secondary | ICD-10-CM

## 2023-12-11 DIAGNOSIS — Z7901 Long term (current) use of anticoagulants: Secondary | ICD-10-CM

## 2023-12-11 NOTE — Telephone Encounter (Signed)
 *  STAT* If patient is at the pharmacy, call can be transferred to refill team.   1. Which medications need to be refilled? (please list name of each medication and dose if known)   warfarin (COUMADIN ) 6 MG tablet     2. Would you like to learn more about the convenience, safety, & potential cost savings by using the [facility]? No      3. Are you open to using the [facility] (Type [facility]. ). No    4. Which pharmacy/location (including street and city if local pharmacy) is medication to be sent to?[id number] - [place], [place] - [address]    5. Do they need a 30 day or 90 day supply? 90 days    Patient is out of meds and need refills today

## 2023-12-20 ENCOUNTER — Ambulatory Visit: Attending: Cardiology | Admitting: Pharmacist

## 2023-12-20 DIAGNOSIS — Z7901 Long term (current) use of anticoagulants: Secondary | ICD-10-CM | POA: Diagnosis present

## 2023-12-20 DIAGNOSIS — Z952 Presence of prosthetic heart valve: Secondary | ICD-10-CM | POA: Insufficient documentation

## 2023-12-20 LAB — POCT INR: INR: 2 (ref 2.0–3.0)

## 2023-12-20 NOTE — Patient Instructions (Addendum)
 Description   INR 2.0 Continue taking warfarin 6mg  (1 tablet) daily.   Stay consistent with greens each week (2 per week). Recheck INR in 6 weeks.  Coumadin  Clinic [phone]

## 2023-12-20 NOTE — Progress Notes (Signed)
 Description   INR 2.0 Continue taking warfarin 6mg  (1 tablet) daily.   Stay consistent with greens each week (2 per week). Recheck INR in 6 weeks.  Coumadin  Clinic [phone]

## 2024-01-01 ENCOUNTER — Ambulatory Visit: Attending: Cardiology | Admitting: Cardiology

## 2024-01-01 ENCOUNTER — Encounter: Payer: Self-pay | Admitting: Cardiology

## 2024-01-01 VITALS — BP 110/62 | HR 81 | Ht 64.0 in | Wt 204.0 lb

## 2024-01-01 DIAGNOSIS — E782 Mixed hyperlipidemia: Secondary | ICD-10-CM | POA: Diagnosis not present

## 2024-01-01 DIAGNOSIS — I5032 Chronic diastolic (congestive) heart failure: Secondary | ICD-10-CM | POA: Insufficient documentation

## 2024-01-01 DIAGNOSIS — Z952 Presence of prosthetic heart valve: Secondary | ICD-10-CM | POA: Diagnosis not present

## 2024-01-01 DIAGNOSIS — I10 Essential (primary) hypertension: Secondary | ICD-10-CM | POA: Diagnosis not present

## 2024-01-01 NOTE — Patient Instructions (Addendum)
 Lab Work: [employer] Pro BNP LIPID PANEL  LP(a)  If you have labs (blood work) drawn today and your tests are completely normal, you will receive your results only by: MyChart Message (if you have MyChart) OR A paper copy in the mail If you have any lab test that is abnormal or we need to change your treatment, we will call you to review the results.  Testing/Procedures: Echocardiogram  Your physician has requested that you have an echocardiogram. Echocardiography is a painless test that uses sound waves to create images of your heart. It provides your doctor with information about the size and shape of your heart and how well your heart's chambers and valves are working. This procedure takes approximately one hour. There are no restrictions for this procedure. Please do NOT wear cologne, perfume, aftershave, or lotions (deodorant is allowed). Please arrive 15 minutes prior to your appointment time.  Please note: We ask at that you not bring children with you during ultrasound (echo/ vascular) testing. Due to room size and safety concerns, children are not allowed in the ultrasound rooms during exams. Our front office staff cannot provide observation of children in our lobby area while testing is being conducted. An adult accompanying a patient to their appointment will only be allowed in the ultrasound room at the discretion of the ultrasound technician under special circumstances. We apologize for any inconvenience.   Follow-Up: At [facility], you and your health needs are our priority.  As part of our continuing mission to provide you with exceptional heart care, our providers are all part of one team.  This team includes your primary Cardiologist (physician) and Advanced Practice Providers or APPs (Physician Assistants and Nurse Practitioners) who all work together to provide you with the care you need, when you need it.  Your next appointment:   6 month(s)  Provider:   Newman JINNY Lawrence, MD

## 2024-01-01 NOTE — Progress Notes (Signed)
 Cardiology Office Note:  .   Date:  01/01/2024  ID:  Chelsea English, DOB 10/04/1960, MRN [id number] PCP: Mark Agent, MD  [facility] Providers Cardiologist:  Newman Lawrence, MD PCP: Mark Agent, MD  Chief Complaint  Patient presents with   HFpEF     Chelsea English is a 63 y.o. female with hypertension, hyperlipidemia, type 2 diabetes mellitus, diabetic retinopathy w. left eye blindness, chronic kidney disease, severe aortic stenosis now s/p SAVR (21mm St Jude Mechanical prosthesis), elevated lipoprotein (a).   History of Present Illness  Patient's daughter Chelsea English and language interpreter assisted with language interpretation today.  Patient is doing better compared to last visit.  Leg swelling has improved.  She denies any obvious exertional dyspnea symptoms.  Diabetes remains uncontrolled.  She is tolerating Kerendia  well.   Vitals:   01/01/24 1030  BP: 110/62  Pulse: 81  SpO2: 97%       Review of Systems  Cardiovascular:  Positive for leg swelling. Negative for chest pain, dyspnea on exertion, palpitations and syncope.  Neurological:  Positive for numbness.        Studies Reviewed: SABRA       EKG 10/13/2023: Sinus rhythm with 1st degree A-V block Possible Left atrial enlargement Left axis deviation Left ventricular hypertrophy ( R in aVL , Cornell product ) Nonspecific T wave abnormality When compared with ECG of 17-Jun-2022 07:06,  QRS is narrower    Independently interpreted 08/2023: HbA1C 9.7% Hb 10.6 Cr 1.86, Na 134, Gl 207   Labs 2-11/2022: Chol 123, TG 87, HDL 37, LDL 69 Lp(a) 410 HbA1C 9.9% Hb 7.8 Cr 1.78    Physical Exam Vitals and nursing note reviewed.  Constitutional:      General: She is not in acute distress. Neck:     Vascular: No JVD.  Cardiovascular:     Rate and Rhythm: Normal rate and regular rhythm.     Heart sounds: Normal heart sounds. No murmur heard. Pulmonary:     Effort: Pulmonary effort is  normal.     Breath sounds: Normal breath sounds. No wheezing or rales.  Musculoskeletal:     Right lower leg: Edema (Trace) present.     Left lower leg: Edema (Trace) present.      VISIT DIAGNOSES:   ICD-10-CM   1. Chronic heart failure with preserved ejection fraction (HFpEF) (HCC)  I50.32 Basic metabolic panel with GFR    Pro b natriuretic peptide (BNP)    CANCELED: Brain natriuretic peptide    2. Mixed hyperlipidemia  E78.2 Lipid panel    Lipoprotein A (LPA)    3. S/P AVR  Z95.2 ECHOCARDIOGRAM COMPLETE    4. Essential hypertension  I10 ECHOCARDIOGRAM COMPLETE        Chelsea English is a 63 y.o. female with hypertension, hyperlipidemia, type 2 diabetes mellitus, diabetic retinopathy w. left eye blindness, chronic kidney disease, severe aortic stenosis now s/p SAVR (21mm St Jude Mechanical prosthesis), elevated lipoprotein (a).  Assessment & Plan  Severe aortic stenosis: Diagnosed in 03/2022. Now s/p mechanical AVR with 21 mm Saint Jude valve. Valve functioning well on follow-up echocardiogram on 06/16/2022. Goal: 2.5 Indication: Mechnical AVR Recommend endocarditis prophylaxis. Prescribed azithromycin  (penicillin allergy). Check follow-up echocardiogram today.  CKD 3B: I started on Kerendia  10 mg daily at last visit given her diabetic CKD.  Check BMP and proBNP today.Please consider referral to nephrology.  HFpEF: Euvolemic.  Check proBNP today.  Type 2 diabetes mellitus: Recommend aggressive diabetes management with goal A1c <  7%. Continue to follow with PCP, consider referral to nephrology.  Hypertension: Fairly well controlled.  No change made today.   Mixed hyperlipidemia, elevated lipoprotein (a): 441, possibly contributing to severe aortic stenosis. Currently on Lipitor 40 mg daily, continue same. Check lipid panel and lipoprotein a to see if she would benefit from injectable agents.      No orders of the defined types were placed in this encounter.     F/u in 6 months  Signed, Newman JINNY Lawrence, MD

## 2024-01-02 ENCOUNTER — Ambulatory Visit: Payer: Self-pay | Admitting: Cardiology

## 2024-01-02 LAB — LIPID PANEL
Chol/HDL Ratio: 5.7 ratio — ABNORMAL HIGH (ref 0.0–4.4)
Cholesterol, Total: 178 mg/dL (ref 100–199)
HDL: 31 mg/dL — ABNORMAL LOW (ref >39)
LDL Chol Calc (NIH): 82 mg/dL (ref 0–99)
Triglycerides: 399 mg/dL — ABNORMAL HIGH (ref 0–149)
VLDL Cholesterol Cal: 65 mg/dL — ABNORMAL HIGH (ref 5–40)

## 2024-01-02 LAB — BASIC METABOLIC PANEL WITH GFR
BUN/Creatinine Ratio: 41 — ABNORMAL HIGH (ref 12–28)
BUN: 90 mg/dL (ref 8–27)
CO2: 20 mmol/L (ref 20–29)
Calcium: 10.3 mg/dL (ref 8.7–10.3)
Chloride: 101 mmol/L (ref 96–106)
Creatinine, Ser: 2.21 mg/dL — ABNORMAL HIGH (ref 0.57–1.00)
Glucose: 89 mg/dL (ref 70–99)
Potassium: 5 mmol/L (ref 3.5–5.2)
Sodium: 143 mmol/L (ref 134–144)
eGFR: 24 mL/min/1.73 — ABNORMAL LOW (ref >59)

## 2024-01-02 LAB — LIPOPROTEIN A (LPA): Lipoprotein (a): 211.3 nmol/L — ABNORMAL HIGH (ref <75.0)

## 2024-01-02 LAB — PRO B NATRIURETIC PEPTIDE: NT-Pro BNP: 283 pg/mL (ref 0–287)

## 2024-01-26 ENCOUNTER — Telehealth: Payer: Self-pay | Admitting: *Deleted

## 2024-01-26 ENCOUNTER — Ambulatory Visit: Attending: Cardiology

## 2024-01-26 NOTE — Telephone Encounter (Signed)
 Called patient since she missed her 1pm appointment; there was no answer so left a message to call back to reschedule.

## 2024-02-01 ENCOUNTER — Ambulatory Visit (HOSPITAL_COMMUNITY)
Admission: RE | Admit: 2024-02-01 | Discharge: 2024-02-01 | Disposition: A | Source: Ambulatory Visit | Attending: Cardiology

## 2024-02-01 DIAGNOSIS — Z952 Presence of prosthetic heart valve: Secondary | ICD-10-CM | POA: Insufficient documentation

## 2024-02-01 DIAGNOSIS — I10 Essential (primary) hypertension: Secondary | ICD-10-CM | POA: Diagnosis present

## 2024-02-01 LAB — ECHOCARDIOGRAM COMPLETE
AR max vel: 1.76 cm2
AV Area VTI: 1.88 cm2
AV Area mean vel: 1.79 cm2
AV Mean grad: 12.5 mmHg
AV Peak grad: 21.7 mmHg
Ao pk vel: 2.33 m/s
Area-P 1/2: 5.46 cm2
MV VTI: 2.64 cm2
S' Lateral: 2.72 cm

## 2024-02-01 NOTE — Progress Notes (Signed)
 Unable to leave message, will try again at later time.

## 2024-02-01 NOTE — Progress Notes (Signed)
 Normal heart function. Vale working well. Left atrium with abnormal shadow, could be related to the valve or presence of thrombus. TEE would give better information, but not necessary unless any recent stroke symptoms. Treatment remains warfarin. Need to make sure INR is around 2.5.  02/01/2024

## 2024-02-05 ENCOUNTER — Ambulatory Visit: Attending: Cardiology

## 2024-02-06 ENCOUNTER — Telehealth: Payer: Self-pay | Admitting: *Deleted

## 2024-02-06 NOTE — Telephone Encounter (Signed)
 Spoke with Kennard (pt's daughter) and advised that patient needs an appointment to have INR checked soon since having a new thrombus per echo. She states they have missed a couple appointments due to oversleeping yesterday and her sister being diagnose with cancer. Asked if she could bring the patient tomorrow and she states she will have her other sister or daughter to bring the patient tomorrow for the appointment that was made.   Confirmed that the appointment is 02/07/24 at 1030am at [address] building on the 5th floor.   Per Echo note:  Chelsea JINNY Lawrence, MD 02/01/2024  4:36 PM EST     Normal heart function. Vale working well. Left atrium with abnormal shadow, could be related to the valve or presence of thrombus. TEE would give better information, but not necessary unless any recent stroke symptoms. Treatment remains warfarin. Need to make sure INR is around 2.5.   02/01/2024

## 2024-02-06 NOTE — Telephone Encounter (Signed)
 Fyi.

## 2024-02-07 ENCOUNTER — Ambulatory Visit: Attending: Cardiology

## 2024-02-07 DIAGNOSIS — Z7901 Long term (current) use of anticoagulants: Secondary | ICD-10-CM | POA: Insufficient documentation

## 2024-02-07 DIAGNOSIS — Z952 Presence of prosthetic heart valve: Secondary | ICD-10-CM | POA: Diagnosis present

## 2024-02-07 LAB — POCT INR: INR: 4.1 — AB (ref 2.0–3.0)

## 2024-02-07 NOTE — Progress Notes (Signed)
 Description   02/01/24 ECHO NOTE PER MD-Need to make sure INR is around 2.5 INR-4.1; Do not take any warfarin today then continue taking warfarin 6mg  (1 tablet) daily.   Stay consistent with greens each week (2 per week). Recheck INR in 1 week.  Coumadin  Clinic [phone]

## 2024-02-07 NOTE — Patient Instructions (Addendum)
 Description   02/01/24 ECHO NOTE PER MD-Need to make sure INR is around 2.5 INR-4.1; Do not take any warfarin today then continue taking warfarin 6mg  (1 tablet) daily.   Stay consistent with greens each week (2 per week). Recheck INR in 1 week.  Coumadin  Clinic [phone]

## 2024-02-12 ENCOUNTER — Other Ambulatory Visit: Payer: Self-pay | Admitting: Cardiology

## 2024-02-12 DIAGNOSIS — Z7901 Long term (current) use of anticoagulants: Secondary | ICD-10-CM

## 2024-02-12 DIAGNOSIS — Z952 Presence of prosthetic heart valve: Secondary | ICD-10-CM

## 2024-02-12 NOTE — Telephone Encounter (Signed)
 Refill request for warfarin:  Last INR was 4.1 on 02/07/24 Next INR due 02/14/24 LOV was 01/01/24  Refill approved.

## 2024-02-13 ENCOUNTER — Telehealth: Payer: Self-pay | Admitting: Cardiology

## 2024-02-13 NOTE — Telephone Encounter (Signed)
 Returned call to the Pharmacy and spoke with Lorenza and she states she is unsure what is going on with the warfarin prescription and placed on hold again so she could speak to someone to figure it out.   Spoke with Lamarr and she states that this is the first time they are filling it at this [facility] for the pt and wanted to inquire exactly how it's being taken. Advised that she is taken 1 tablet (6mg ) daily and has been since 03/17/23 as her maintenance doseage. She states she can see where the rx was sent to another place previously in October and she received a 100 tablets which was a 90 day supply, therefore the patient should not be out of warfarin. She states she will update the patient and family and advise may have to pay out of pocket until able to receive the full prescription on 02/16/24 if they need it today.

## 2024-02-13 NOTE — Telephone Encounter (Signed)
 Pharmacy needs more specific instructions for insurance to cover. Please advise. warfarin (COUMADIN ) 6 MG tablet

## 2024-02-13 NOTE — Telephone Encounter (Signed)
 Returned call to the Pharmacy and was place don hold for an extended time and unable to continue holding as I have patient care to complete. Will have to try back later

## 2024-02-14 ENCOUNTER — Ambulatory Visit: Admitting: *Deleted

## 2024-02-14 ENCOUNTER — Other Ambulatory Visit: Payer: Self-pay | Admitting: Cardiology

## 2024-02-14 ENCOUNTER — Telehealth: Payer: Self-pay | Admitting: Cardiology

## 2024-02-14 DIAGNOSIS — Z952 Presence of prosthetic heart valve: Secondary | ICD-10-CM | POA: Insufficient documentation

## 2024-02-14 DIAGNOSIS — Z7901 Long term (current) use of anticoagulants: Secondary | ICD-10-CM | POA: Diagnosis present

## 2024-02-14 LAB — POCT INR: INR: 2.1 (ref 2.0–3.0)

## 2024-02-14 MED ORDER — KERENDIA 10 MG PO TABS: 1.0000 | ORAL_TABLET | Freq: Every day | ORAL | 2 refills | Status: AC

## 2024-02-14 NOTE — Telephone Encounter (Signed)
 Prescription sent

## 2024-02-14 NOTE — Telephone Encounter (Addendum)
 Warfarin 6mg  Dx-s/p avr    Last INR Check-02/07/24 and due today-will await to send as she was 2 weeks overdue, it was last sent on 02/12/24 by another user Last OV- 01/01/24   Spoke with pharmacist at [facility] and he stated they have both the Warfairn and Kerendia  refills, denied these duplicates

## 2024-02-14 NOTE — Progress Notes (Signed)
 Description   02/01/24 ECHO NOTE PER MD-Need to make sure INR is around 2.5 INR-2.1; Today take 9mg  of warfarin (1.5 tablets) then continue taking warfarin 6mg  (1 tablet) daily.   Stay consistent with greens each week (2 per week). Recheck INR in 1 week.  Coumadin  Clinic [phone]

## 2024-02-14 NOTE — Patient Instructions (Signed)
 Description   02/01/24 ECHO NOTE PER MD-Need to make sure INR is around 2.5 INR-2.1; Today take 9mg  of warfarin (1.5 tablets) then continue taking warfarin 6mg  (1 tablet) daily.   Stay consistent with greens each week (2 per week). Recheck INR in 1 week.  Coumadin  Clinic [phone]

## 2024-02-14 NOTE — Telephone Encounter (Signed)
" °*  STAT* If patient is at the pharmacy, call can be transferred to refill team.   1. Which medications need to be refilled? (please list name of each medication and dose if known)   Finerenone  (KERENDIA ) 10 MG TABS     4. Which pharmacy/location (including street and city if local pharmacy) is medication to be sent to? [facility] - [place], [place] - [address]     5. Do they need a 30 day or 90 day supply? 90   "

## 2024-02-21 ENCOUNTER — Ambulatory Visit: Admitting: *Deleted

## 2024-02-21 DIAGNOSIS — Z952 Presence of prosthetic heart valve: Secondary | ICD-10-CM | POA: Insufficient documentation

## 2024-02-21 DIAGNOSIS — Z5181 Encounter for therapeutic drug level monitoring: Secondary | ICD-10-CM | POA: Insufficient documentation

## 2024-02-21 LAB — POCT INR: POC INR: 2.9

## 2024-02-21 NOTE — Patient Instructions (Signed)
 Description   02/01/24 ECHO NOTE PER MD-Need to make sure INR is around 2.5 INR-2.9;  continue taking warfarin 6mg  (1 tablet) daily.   Stay consistent with greens each week (2 per week). Recheck INR in 2 weeks.  Coumadin  Clinic [phone]

## 2024-02-21 NOTE — Progress Notes (Signed)
 Lab Results  Component Value Date   INR 2.9 02/21/2024   INR 2.1 02/14/2024   INR 4.1 (A) 02/07/2024    Description   02/01/24 ECHO NOTE PER MD-Need to make sure INR is around 2.5 INR-2.9;  continue taking warfarin 6mg  (1 tablet) daily.   Stay consistent with greens each week (2 per week). Recheck INR in 2 weeks.  Coumadin  Clinic [phone]

## 2024-03-06 ENCOUNTER — Ambulatory Visit: Attending: Cardiology | Admitting: *Deleted

## 2024-03-06 DIAGNOSIS — Z7901 Long term (current) use of anticoagulants: Secondary | ICD-10-CM | POA: Insufficient documentation

## 2024-03-06 DIAGNOSIS — Z952 Presence of prosthetic heart valve: Secondary | ICD-10-CM | POA: Insufficient documentation

## 2024-03-06 LAB — POCT INR: INR: 2 (ref 2.0–3.0)

## 2024-03-06 NOTE — Progress Notes (Signed)
 Description   02/01/24 ECHO NOTE PER MD-Need to make sure INR is around 2.5 INR-2.0; Today take 9mg  (1.5 tablets of the 6mg  ) then continue taking warfarin 6mg  (1 tablet) daily.   Stay consistent with greens each week (2 per week). Recheck INR in 2 weeks. Coumadin  Clinic [phone]

## 2024-03-06 NOTE — Patient Instructions (Signed)
 Description   02/01/24 ECHO NOTE PER MD-Need to make sure INR is around 2.5 INR-2.0; Today take 9mg  (1.5 tablets of the 6mg  ) then continue taking warfarin 6mg  (1 tablet) daily.   Stay consistent with greens each week (2 per week). Recheck INR in 2 weeks. Coumadin  Clinic [phone]

## 2024-03-20 ENCOUNTER — Telehealth: Payer: Self-pay | Admitting: *Deleted

## 2024-03-20 ENCOUNTER — Ambulatory Visit

## 2024-03-20 NOTE — Telephone Encounter (Signed)
 Called patient since she missed her appointment today; there was no answer so left a message.

## 2024-04-01 ENCOUNTER — Ambulatory Visit
